# Patient Record
Sex: Male | Born: 1970 | Race: Black or African American | Hispanic: No | Marital: Married | Smoking: Light tobacco smoker
Health system: Southern US, Community
[De-identification: ages and names within clinical notes are randomized; demographics above are authoritative.]

## PROBLEM LIST (undated history)

## (undated) DIAGNOSIS — F4310 Post-traumatic stress disorder, unspecified: Secondary | ICD-10-CM

## (undated) DIAGNOSIS — E785 Hyperlipidemia, unspecified: Secondary | ICD-10-CM

## (undated) DIAGNOSIS — G935 Compression of brain: Secondary | ICD-10-CM

## (undated) DIAGNOSIS — G44009 Cluster headache syndrome, unspecified, not intractable: Secondary | ICD-10-CM

## (undated) DIAGNOSIS — G039 Meningitis, unspecified: Secondary | ICD-10-CM

## (undated) DIAGNOSIS — K219 Gastro-esophageal reflux disease without esophagitis: Secondary | ICD-10-CM

## (undated) DIAGNOSIS — I10 Essential (primary) hypertension: Secondary | ICD-10-CM

## (undated) DIAGNOSIS — F419 Anxiety disorder, unspecified: Secondary | ICD-10-CM

## (undated) DIAGNOSIS — I82409 Acute embolism and thrombosis of unspecified deep veins of unspecified lower extremity: Secondary | ICD-10-CM

## (undated) DIAGNOSIS — I2699 Other pulmonary embolism without acute cor pulmonale: Secondary | ICD-10-CM

## (undated) DIAGNOSIS — G4730 Sleep apnea, unspecified: Secondary | ICD-10-CM

## (undated) DIAGNOSIS — D369 Benign neoplasm, unspecified site: Secondary | ICD-10-CM

## (undated) DIAGNOSIS — G500 Trigeminal neuralgia: Secondary | ICD-10-CM

## (undated) DIAGNOSIS — G4733 Obstructive sleep apnea (adult) (pediatric): Secondary | ICD-10-CM

## (undated) DIAGNOSIS — G43909 Migraine, unspecified, not intractable, without status migrainosus: Secondary | ICD-10-CM

## (undated) DIAGNOSIS — M545 Low back pain, unspecified: Secondary | ICD-10-CM

## (undated) DIAGNOSIS — F17200 Nicotine dependence, unspecified, uncomplicated: Secondary | ICD-10-CM

## (undated) HISTORY — PX: ANTERIOR CRUCIATE LIGAMENT REPAIR: SHX115

## (undated) HISTORY — DX: Anxiety disorder, unspecified: F41.9

## (undated) HISTORY — DX: Other pulmonary embolism without acute cor pulmonale: I26.99

## (undated) HISTORY — DX: Compression of brain: G93.5

## (undated) HISTORY — DX: Migraine, unspecified, not intractable, without status migrainosus: G43.909

## (undated) HISTORY — DX: Trigeminal neuralgia: G50.0

## (undated) HISTORY — DX: Obstructive sleep apnea (adult) (pediatric): G47.33

## (undated) HISTORY — DX: Acute embolism and thrombosis of unspecified deep veins of unspecified lower extremity: I82.409

## (undated) HISTORY — DX: Low back pain, unspecified: M54.50

## (undated) HISTORY — PX: NOSE SURGERY: SHX723

## (undated) HISTORY — DX: Nicotine dependence, unspecified, uncomplicated: F17.200

## (undated) HISTORY — DX: Sleep apnea, unspecified: G47.30

## (undated) HISTORY — DX: Benign neoplasm, unspecified site: D36.9

## (undated) HISTORY — PX: FOOT SURGERY: SHX648

## (undated) HISTORY — PX: VASECTOMY: SHX75

## (undated) HISTORY — PX: SINUS SURGERY: SHX187

## (undated) HISTORY — DX: Post-traumatic stress disorder, unspecified: F43.10

## (undated) HISTORY — DX: Hyperlipidemia, unspecified: E78.5

## (undated) HISTORY — PX: OTHER SURGICAL HISTORY: SHX169

---

## 2001-04-02 ENCOUNTER — Encounter: Payer: Self-pay | Admitting: Emergency Medicine

## 2001-04-02 ENCOUNTER — Emergency Department (HOSPITAL_COMMUNITY): Admission: EM | Admit: 2001-04-02 | Discharge: 2001-04-02 | Payer: Self-pay | Admitting: Emergency Medicine

## 2004-01-27 ENCOUNTER — Emergency Department
Admission: EM | Admit: 2004-01-27 | Disposition: A | Discharge status: Home / Self Care | Payer: Self-pay | Source: Emergency Department | Admitting: Emergency Medicine

## 2004-06-07 ENCOUNTER — Emergency Department
Admission: EM | Admit: 2004-06-07 | Disposition: A | Discharge status: Home / Self Care | Payer: Self-pay | Source: Emergency Department | Admitting: Emergency Medicine

## 2004-11-08 ENCOUNTER — Emergency Department
Admission: EM | Admit: 2004-11-08 | Disposition: A | Discharge status: Home / Self Care | Payer: Self-pay | Source: Emergency Department | Admitting: Emergency Medicine

## 2005-01-13 ENCOUNTER — Emergency Department
Admission: EM | Admit: 2005-01-13 | Disposition: A | Discharge status: Home / Self Care | Payer: Self-pay | Source: Emergency Department | Admitting: Emergency Medicine

## 2005-05-12 ENCOUNTER — Emergency Department
Admission: EM | Admit: 2005-05-12 | Disposition: A | Discharge status: Home / Self Care | Payer: Self-pay | Source: Emergency Department | Admitting: Emergency Medicine

## 2005-05-13 LAB — ^CBC WITH DIFF MCKESSON
BASOPHILS %: 0.7 % (ref 0–2)
Baso(Absolute): 0
Eosinophils %: 3.5 % (ref 0–6)
Eosinophils Absolute: 0.2
Hematocrit: 45.3 % (ref 39.0–49.0)
Hemoglobin: 15.6 g/dL (ref 13.2–17.3)
Lymphocytes Absolute: 2.9
Lymphocytes Relative: 41.6 % (ref 25–55)
MCH: 30.5 pg (ref 27.0–34.0)
MCHC: 34.4 % (ref 32.0–36.0)
MCV: 88.6 fL (ref 80–100)
Monocytes Absolute: 0.6
Monocytes Relative %: 8.2 % — ABNORMAL HIGH (ref 1–8)
Neutrophils Absolute: 3.2
Neutrophils Relative %: 46 % — ABNORMAL LOW (ref 49–69)
Platelets: 175 10*3/uL (ref 150–400)
RBC: 5.11 /mm3 (ref 3.80–5.40)
RDW: 13.7 % (ref 11.0–14.0)
WBC: 7.1 10*3/uL (ref 4.8–10.8)

## 2005-05-13 LAB — BASIC METABOLIC PANEL
BUN / Creatinine Ratio: 11 (ref 8–20)
BUN: 14 mg/dL (ref 6–20)
CO2: 28 mmol/L (ref 21.0–31.0)
Calcium: 9.1 mg/dL (ref 8.4–10.2)
Chloride: 103 mmol/L (ref 101–111)
Creatinine: 1.26 mg/dL (ref 0.5–1.4)
EGFR: >60 mL/min/{1.73_m2}
EGFR: >60 mL/min/{1.73_m2}
Glucose: 99 mg/dL (ref 70–110)
Potassium: 4.3 mmol/L (ref 3.6–5.0)
Sodium: 140 mmol/L (ref 135–145)

## 2005-05-13 LAB — ^CKMB MCKESSON
CKMB Index: 0.29 % (ref 0.00–2.50)
CKMB Mass: 0.71 ng/mL (ref 0.00–3.38)

## 2005-05-13 LAB — CK: Creatine Kinase (CK): 247 U/L — ABNORMAL HIGH (ref 19–216)

## 2005-05-13 LAB — MAGNESIUM: Magnesium: 2.1 mg/dL (ref 1.7–2.2)

## 2005-05-13 LAB — ^TROPONIN I MCKESSON: Troponin: <0.04 ng/mL (ref 0.00–0.08)

## 2005-05-13 LAB — B-TYPE NATRIURETIC PEPTIDE: B-Natriuretic Peptide: <5 pg/mL (ref 0–100)

## 2005-05-13 LAB — D-DIMER, QUANTITATIVE: D-Dimer, Quant.: 692 ng/mL

## 2011-07-27 ENCOUNTER — Emergency Department: Payer: Enrolled Prime—HMO

## 2011-07-27 ENCOUNTER — Emergency Department
Admission: EM | Admit: 2011-07-27 | Discharge: 2011-07-27 | Disposition: A | Discharge status: Home / Self Care | Payer: Enrolled Prime—HMO | Attending: Emergency Medical Services | Admitting: Emergency Medical Services

## 2011-07-27 DIAGNOSIS — R51 Headache: Secondary | ICD-10-CM

## 2011-07-27 DIAGNOSIS — I10 Essential (primary) hypertension: Secondary | ICD-10-CM | POA: Insufficient documentation

## 2011-07-27 HISTORY — DX: Cluster headache syndrome, unspecified, not intractable: G44.009

## 2011-07-27 HISTORY — DX: Essential (primary) hypertension: I10

## 2011-07-27 MED ORDER — SODIUM CHLORIDE 0.9 % IV BOLUS
1000.00 mL | Freq: Once | INTRAVENOUS | Status: AC
Start: 2011-07-27 — End: 2011-07-27
  Administered 2011-07-27: 1000 mL via INTRAVENOUS

## 2011-07-27 MED ORDER — ONDANSETRON HCL 4 MG/2ML IJ SOLN
4.00 mg | Freq: Once | INTRAMUSCULAR | Status: AC
Start: 2011-07-27 — End: 2011-07-27
  Administered 2011-07-27: 4 mg via INTRAVENOUS
  Filled 2011-07-27: qty 2

## 2011-07-27 MED ORDER — KETOROLAC TROMETHAMINE 30 MG/ML IJ SOLN
30.00 mg | Freq: Once | INTRAMUSCULAR | Status: AC
Start: 2011-07-27 — End: 2011-07-27
  Administered 2011-07-27: 30 mg via INTRAVENOUS
  Filled 2011-07-27: qty 1

## 2011-07-27 MED ORDER — ONDANSETRON 4 MG PO TBDP
4.00 mg | ORAL_TABLET | Freq: Three times a day (TID) | ORAL | Status: AC | PRN
Start: 2011-07-27 — End: 2011-08-03

## 2011-07-27 MED ORDER — KETOROLAC TROMETHAMINE 10 MG PO TABS
10.00 mg | ORAL_TABLET | Freq: Four times a day (QID) | ORAL | Status: AC | PRN
Start: 2011-07-27 — End: 2011-08-01

## 2011-07-27 MED ORDER — HYDROCODONE-ACETAMINOPHEN 5-500 MG PO TABS
1.00 | ORAL_TABLET | Freq: Four times a day (QID) | ORAL | Status: AC | PRN
Start: 2011-07-27 — End: 2011-08-06

## 2011-07-27 MED ORDER — HYDROMORPHONE HCL PF 1 MG/ML IJ SOLN
1.00 mg | Freq: Once | INTRAMUSCULAR | Status: DC
Start: 2011-07-27 — End: 2011-07-27
  Filled 2011-07-27: qty 1

## 2011-07-27 NOTE — ED Provider Notes (Signed)
Physician/Midlevel provider first contact with patient: 61    History     Chief Complaint   Patient presents with   . Headache     Patient is a 41 y.o. male presenting with headaches. The history is provided by the patient. No language interpreter was used.   Headache  The primary symptoms include headaches. Primary symptoms do not include dizziness, fever, nausea or vomiting. The symptoms began yesterday. The symptoms are unchanged. The neurological symptoms are diffuse. Context: none.   The headache is not associated with photophobia or weakness.   Additional symptoms do not include weakness or photophobia. Medical issues also include hypertension.     41 y/o male, w/ hx of cluster HA, c/o HA starting behind eye and radiating to frontal area and neck starting yesterday morning. Pt also reports rhinorrhea yesterday and profuse tearing today. He spoke w/ neurologist last night and was unable to be seen until next week. He denies recent trauma, N/V, CP, SOB, URI sx, abd pain, rash, or urinary or bladder problems. Pt took 800mg  motrin w/ no relief. No h/o intracranial aneurysms in himself or family.  + minimal photophobia.    Past Medical History   Diagnosis Date   . Cluster headache    . Hypertensive disorder        Past Surgical History   Procedure Date   . Sinus surgery    . Foot surgery        No family history on file.    Social  History   Substance Use Topics   . Smoking status: Current Everyday Smoker -- 0.2 packs/day   . Smokeless tobacco: Not on file   . Alcohol Use: Yes      social       No Known Allergies    Current/Home Medications    LISINOPRIL (PRINIVIL,ZESTRIL) 10 MG TABLET    Take 10 mg by mouth daily.    OMEPRAZOLE (PRILOSEC) 40 MG CAPSULE    Take 40 mg by mouth daily.        Review of Systems   Constitutional: Negative for fever and chills.   HENT: Positive for rhinorrhea. Negative for sore throat and neck pain.    Eyes: Negative for photophobia and discharge.        Tearing   Respiratory: Negative  for cough and shortness of breath.    Cardiovascular: Negative for chest pain and palpitations.   Gastrointestinal: Negative for nausea, vomiting, abdominal pain and diarrhea.   Genitourinary: Negative for dysuria, frequency and hematuria.   Musculoskeletal: Negative for myalgias and back pain.   Neurological: Positive for headaches. Negative for dizziness, syncope, weakness, light-headedness and numbness.   Psychiatric/Behavioral: Negative for suicidal ideas and confusion.       Physical Exam    BP 121/80  Pulse 73  Temp(Src) 96.2 F (35.7 C) (Temporal Artery)  Resp 18  Ht 1.829 m  Wt 109.77 kg  BMI 32.81 kg/m2  SpO2 99%    Physical Exam   Nursing note and vitals reviewed.  Constitutional: He is oriented to person, place, and time. He appears well-developed and well-nourished.  Non-toxic appearance. No distress.   HENT:   Head: Atraumatic.   Mouth/Throat: Oropharynx is clear and moist. No oropharyngeal exudate.        Minimal tend over right parietal and frontal region     Eyes: Conjunctivae, EOM and lids are normal. Pupils are equal, round, and reactive to light. Right eye exhibits no discharge and no  exudate. Left eye exhibits no discharge and no exudate. Right conjunctiva is not injected. Left conjunctiva is not injected.        Increased clear drainage from right eye   Neck: Normal range of motion and full passive range of motion without pain. Neck supple. No JVD present. No tracheal tenderness, no spinous process tenderness and no muscular tenderness present. Carotid bruit is not present. Normal range of motion present.   Cardiovascular: Normal rate, regular rhythm, normal heart sounds, intact distal pulses and normal pulses.    Pulmonary/Chest: Effort normal and breath sounds normal. No stridor. No respiratory distress. He has no decreased breath sounds. He has no wheezes.   Abdominal: Soft. Bowel sounds are normal. He exhibits no distension and no mass. There is no tenderness. There is no rebound and  no guarding.   Musculoskeletal: Normal range of motion. He exhibits no edema and no tenderness.        No calf tenderness, no Homman's sign   Neurological: He is alert and oriented to person, place, and time. He has normal strength and normal reflexes. No cranial nerve deficit or sensory deficit. Coordination normal. GCS eye subscore is 4. GCS verbal subscore is 5. GCS motor subscore is 6.   Reflex Scores:       Patellar reflexes are 2+ on the right side and 2+ on the left side.  Skin: Skin is warm. No rash noted. He is not diaphoretic. No pallor.   Psychiatric: He has a normal mood and affect. His speech is normal and behavior is normal. Judgment and thought content normal. Cognition and memory are normal.       MDM and ED Course     ED Medication Orders     None           MDM  Number of Diagnoses or Management Options  Headache:   Diagnosis management comments: I, Reine Just, have assumed care of Angel [name].  I have completed his evaluation, reviewed all pertinent data, and determined his final disposition.    I attest that the documentation recorded by the scribe reflects the service I have performed and the decisions made by me. Physician documentation supersedes that of the scribe.   I Dr. Reine Just saw this pt.     Oxygen saturation by pulse oximetry is 95%-100%, Normal.  Interventions: None Needed.     I reviewed all labs and/or radiology studies.   Ddx:  Cluster headache, intracranial pathology, migraines.  Presentation and symptoms do not warrant LP if the ct of head is negative    There is a 30-45 minute delay in medication order times due to fault in EPIC system.  The orders in the ED are always stat orders.     pt. Feeling better        Procedures    Clinical Impression & Disposition     Clinical Impression  Final diagnoses:   Headache        ED Disposition     Discharge Angel [name] discharge to home/self care.    Condition at discharge: Good             New Prescriptions     HYDROCODONE-ACETAMINOPHEN (VICODIN) 5-500 MG PER TABLET    Take 1 tablet by mouth every 6 (six) hours as needed for Pain.    KETOROLAC (TORADOL) 10 MG TABLET    Take 1 tablet (10 mg total) by mouth every 6 (six) hours as needed for Pain.  ONDANSETRON (ZOFRAN ODT) 4 MG DISINTEGRATING TABLET    Take 1 tablet (4 mg total) by mouth every 8 (eight) hours as needed for Nausea.        Treatment Team: Scribe: Marlinda Mike, MD  07/27/11 [phone]

## 2011-07-27 NOTE — ED Notes (Signed)
Pt does not have a ride home and he declined Dilaudid and not able to get a ride.

## 2011-07-27 NOTE — ED Notes (Signed)
C/O headache onset yesterday AM, behind R eye, to R temporal area, down neck.  + photosensitivity.  States did have @ eye irritation 2 days ago.

## 2011-07-27 NOTE — Discharge Instructions (Signed)
f/u with pcp or Dr. Steva Ready in 2-3 days for re-eval. return to ed if worsen or not better or have any acute concerns.     Headache    You have been treated for a headache.    Headaches are very common. Most of the time they are benign (not harmful). Some headaches can be very serious. Your headache appears to be benign. The doctor feels it is safe for you to go home.    If you continue to have headaches, or if this headache does not resolve over the next few days, you should be evaluated by your regular doctor or a neurologist. Keep a "headache diary." This may help your doctor learn the cause of your headaches.    Take your headache medication as directed. This is especially important if your doctor has placed you on a daily medication to prevent headaches.    YOU SHOULD SEEK MEDICAL ATTENTION IMMEDIATELY, EITHER HERE OR AT THE NEAREST EMERGENCY DEPARTMENT, IF ANY OF THE FOLLOWING OCCURS:   Your headache gets worse.   You have a severe headache that occurs suddenly.   Your head pain is different from your normal headache.   You have a fever, especially with a stiff neck.   You feel numbness, tingling, or weakness in your arms or legs.   You pass out.   You have problems with your vision.   You vomit and have trouble taking medication or keeping it down.

## 2011-07-30 ENCOUNTER — Emergency Department: Payer: Enrolled Prime—HMO

## 2011-07-30 ENCOUNTER — Emergency Department
Admission: EM | Admit: 2011-07-30 | Discharge: 2011-07-30 | Disposition: A | Discharge status: Home / Self Care | Payer: Enrolled Prime—HMO | Attending: Emergency Medicine | Admitting: Emergency Medicine

## 2011-07-30 DIAGNOSIS — R51 Headache: Secondary | ICD-10-CM | POA: Insufficient documentation

## 2011-07-30 DIAGNOSIS — I10 Essential (primary) hypertension: Secondary | ICD-10-CM | POA: Insufficient documentation

## 2011-07-30 DIAGNOSIS — F17200 Nicotine dependence, unspecified, uncomplicated: Secondary | ICD-10-CM | POA: Insufficient documentation

## 2011-07-30 HISTORY — DX: Meningitis, unspecified: G03.9

## 2011-07-30 HISTORY — DX: Gastro-esophageal reflux disease without esophagitis: K21.9

## 2011-07-30 LAB — CBC AND DIFFERENTIAL
Basophils Absolute Automated: 0.02 10*3/uL (ref 0.00–0.20)
Basophils Automated: 0 % (ref 0–2)
Eosinophils Absolute Automated: 0.12 10*3/uL (ref 0.00–0.70)
Eosinophils Automated: 1 % (ref 0–5)
Hematocrit: 41.5 % — ABNORMAL LOW (ref 42.0–52.0)
Hgb: 14.8 g/dL (ref 13.0–17.0)
Immature Granulocytes Absolute: 0.04 10*3/uL
Immature Granulocytes: 0 % (ref 0–1)
Lymphocytes Absolute Automated: 4.26 10*3/uL (ref 0.50–4.40)
Lymphocytes Automated: 30 % (ref 15–41)
MCH: 29.4 pg (ref 28.0–32.0)
MCHC: 35.7 g/dL (ref 32.0–36.0)
MCV: 82.5 fL (ref 80.0–100.0)
MPV: 9.2 fL — ABNORMAL LOW (ref 9.4–12.3)
Monocytes Absolute Automated: 0.97 10*3/uL (ref 0.00–1.20)
Monocytes: 7 % (ref 0–11)
Neutrophils Absolute: 8.94 10*3/uL — ABNORMAL HIGH (ref 1.80–8.10)
Neutrophils: 63 % (ref 52–75)
Platelets: 174 10*3/uL (ref 140–400)
RBC: 5.03 10*6/uL (ref 4.70–6.00)
RDW: 14 % (ref 12–15)
WBC: 14.31 10*3/uL — ABNORMAL HIGH (ref 3.50–10.80)

## 2011-07-30 LAB — COMPREHENSIVE METABOLIC PANEL
ALT: 31 (ref 7–56)
AST (SGOT): 21 U/L (ref 5–40)
Albumin/Globulin Ratio: 1.3
Albumin: 4.1 g/dL (ref 3.9–5.0)
Alkaline Phosphatase: 74 U/L (ref 38–126)
BUN: 14 mg/dL (ref 6–20)
Bilirubin, Total: 0.5 mg/dL (ref 0.2–1.3)
CO2: 22 mmol/L (ref 21.1–31.1)
Calcium: 9.3 mg/dL (ref 8.4–10.2)
Chloride: 108 mmol/L (ref 101–111)
Creatinine: 1.16 mg/dL (ref 0.66–1.25)
Globulin: 3.1 g/dL
Glucose: 110 mg/dL — ABNORMAL HIGH (ref 70–100)
Potassium: 3.9 mmol/L (ref 3.6–5.0)
Protein, Total: 7.2 g/dL (ref 6.3–8.2)
Sodium: 143 mmol/L (ref 135–145)

## 2011-07-30 LAB — SEDIMENTATION RATE: Sed Rate: 10 mm/Hr (ref 0–15)

## 2011-07-30 LAB — GFR: EGFR: >60

## 2011-07-30 MED ORDER — SODIUM CHLORIDE 0.9 % IV BOLUS
1000.00 mL | Freq: Once | INTRAVENOUS | Status: AC
Start: 2011-07-30 — End: 2011-07-30
  Administered 2011-07-30: 1000 mL via INTRAVENOUS

## 2011-07-30 MED ORDER — PROMETHAZINE HCL 25 MG/ML IJ SOLN
12.50 mg | Freq: Once | INTRAMUSCULAR | Status: AC
Start: 2011-07-30 — End: 2011-07-30
  Administered 2011-07-30: 12.5 mg via INTRAVENOUS
  Filled 2011-07-30: qty 1

## 2011-07-30 MED ORDER — LORAZEPAM 2 MG/ML IJ SOLN
1.00 mg | Freq: Once | INTRAMUSCULAR | Status: AC
Start: 2011-07-30 — End: 2011-07-30
  Administered 2011-07-30: 1 mg via INTRAVENOUS
  Filled 2011-07-30: qty 1

## 2011-07-30 MED ORDER — HYDROMORPHONE HCL PF 1 MG/ML IJ SOLN
1.00 mg | Freq: Once | INTRAMUSCULAR | Status: AC
Start: 2011-07-30 — End: 2011-07-30
  Administered 2011-07-30: 1 mg via INTRAVENOUS
  Filled 2011-07-30: qty 1

## 2011-07-30 MED ORDER — DIPHENHYDRAMINE HCL 50 MG/ML IJ SOLN
25.00 mg | Freq: Once | INTRAMUSCULAR | Status: AC
Start: 2011-07-30 — End: 2011-07-30
  Administered 2011-07-30: 25 mg via INTRAVENOUS
  Filled 2011-07-30: qty 1

## 2011-07-30 MED ORDER — KETOROLAC TROMETHAMINE 30 MG/ML IJ SOLN
30.00 mg | Freq: Once | INTRAMUSCULAR | Status: AC
Start: 2011-07-30 — End: 2011-07-30
  Administered 2011-07-30: 30 mg via INTRAVENOUS
  Filled 2011-07-30: qty 1

## 2011-07-30 NOTE — ED Notes (Signed)
Report given to stacey rn

## 2011-07-30 NOTE — Discharge Instructions (Signed)
Headache    You have been treated for a headache.    Headaches are very common. Most of the time they are benign (not harmful). Some headaches can be very serious. Your headache appears to be benign. The doctor feels it is safe for you to go home.    If you continue to have headaches, or if this headache does not resolve over the next few days, you should be evaluated by your regular doctor or a neurologist. Keep a "headache diary." This may help your doctor learn the cause of your headaches.    Take your headache medication as directed. This is especially important if your doctor has placed you on a daily medication to prevent headaches.    YOU SHOULD SEEK MEDICAL ATTENTION IMMEDIATELY, EITHER HERE OR AT THE NEAREST EMERGENCY DEPARTMENT, IF ANY OF THE FOLLOWING OCCURS:   Your headache gets worse.   You have a severe headache that occurs suddenly.   Your head pain is different from your normal headache.   You have a fever, especially with a stiff neck.   You feel numbness, tingling, or weakness in your arms or legs.   You pass out.   You have problems with your vision.   You vomit and have trouble taking medication or keeping it down.

## 2011-07-30 NOTE — ED Provider Notes (Signed)
Physician/Midlevel provider first contact with patient: 8    History     Chief Complaint   Patient presents with   . Headache     HPI Comments: Patient with c/o HA starting 6 days ago after drinking some ETOH. States HA is over the right orbit. HA has been persistent and gradual in onset. Has not improved since onset. Was seen in ED 2 days after onset with negative CT head. Also seen by neurology ([facility]) 2 days ago and started on imitrex and prednisone with improvement. No motor or sensory deficits. No neck pain. No n/v. Mild photophobia. States he has had HA's in the past associated with drinking ETOH, but this HA is more persistent.     Patient is a 41 y.o. male presenting with headaches.   Headache  The primary symptoms include headaches and visual change. Primary symptoms do not include syncope, loss of consciousness, altered mental status, seizures, dizziness, paresthesias, focal weakness, loss of sensation, speech change, memory loss, fever, nausea or vomiting. The symptoms began 5 to 7 days ago. The symptoms are unchanged. The neurological symptoms are focal. The symptoms occurred after drinking alcohol.   The headache is associated with photophobia and visual change. The headache is not associated with aura, eye pain, neck stiffness, paresthesias, weakness or loss of balance.   The visual change includes photophobia.   Additional symptoms include photophobia. Additional symptoms do not include neck stiffness, weakness, loss of balance, aura, hallucinations, nystagmus, taste disturbance, hyperacusis, hearing loss, tinnitus or vertigo. Medical issues also include alcohol use and hypertension. Medical issues do not include seizures, cerebral vascular accident, cancer, drug use, diabetes or recent surgery. Workup history includes CT scan.       Past Medical History   Diagnosis Date   . Cluster headache    . Hypertensive disorder    . Acid reflux    . Meningitis        Past Surgical History   Procedure Date   .  Sinus surgery    . Foot surgery        History reviewed. No pertinent family history.    Social  History   Substance Use Topics   . Smoking status: Current Everyday Smoker -- 0.2 packs/day   . Smokeless tobacco: Not on file   . Alcohol Use: Yes      social       No Known Allergies    Current/Home Medications    HYDROCODONE-ACETAMINOPHEN (VICODIN) 5-500 MG PER TABLET    Take 1 tablet by mouth every 6 (six) hours as needed for Pain.    KETOROLAC (TORADOL) 10 MG TABLET    Take 1 tablet (10 mg total) by mouth every 6 (six) hours as needed for Pain.    LISINOPRIL (PRINIVIL,ZESTRIL) 10 MG TABLET    Take 10 mg by mouth daily.    OMEPRAZOLE (PRILOSEC) 40 MG CAPSULE    Take 40 mg by mouth daily.    ONDANSETRON (ZOFRAN ODT) 4 MG DISINTEGRATING TABLET    Take 1 tablet (4 mg total) by mouth every 8 (eight) hours as needed for Nausea.        Review of Systems   Constitutional: Negative for fever and chills.   HENT: Negative for hearing loss, neck stiffness and tinnitus.    Eyes: Positive for photophobia. Negative for pain.   Respiratory: Negative for cough and shortness of breath.    Cardiovascular: Negative for chest pain, palpitations and syncope.   Gastrointestinal: Negative for  nausea and vomiting.   Musculoskeletal: Negative for myalgias and back pain.   Skin: Negative for color change and rash.   Neurological: Positive for headaches. Negative for dizziness, vertigo, speech change, focal weakness, seizures, loss of consciousness, weakness, paresthesias and loss of balance.   Psychiatric/Behavioral: Negative for hallucinations, memory loss and altered mental status.       Physical Exam    BP 135/81  Pulse 94  Temp(Src) 97.2 F (36.2 C) (Oral)  Resp 16  Ht 1.829 m  Wt 110.678 kg  BMI 33.09 kg/m2  SpO2 98%    Physical Exam   Constitutional: He is oriented to person, place, and time. He appears well-developed and well-nourished. He appears distressed.   HENT:   Head: Normocephalic and atraumatic.        Not focally  tender over the right temporal artery   Eyes: Conjunctivae and EOM are normal. Pupils are equal, round, and reactive to light. Right eye exhibits no discharge. Left eye exhibits no discharge. No scleral icterus.   Neck: Normal range of motion. Neck supple. No JVD present. No tracheal deviation present.   Cardiovascular: Normal rate, regular rhythm and normal heart sounds.  Exam reveals no gallop and no friction rub.    No murmur heard.  Pulmonary/Chest: Effort normal and breath sounds normal. No stridor. No respiratory distress. He has no wheezes. He has no rales.   Abdominal: Soft. Bowel sounds are normal. He exhibits no distension and no mass. There is no tenderness. There is no rebound, no guarding and no CVA tenderness.   Musculoskeletal: Normal range of motion. He exhibits no edema and no tenderness.   Lymphadenopathy:     He has no cervical adenopathy.   Neurological: He is alert and oriented to person, place, and time. He has normal strength. No sensory deficit. GCS eye subscore is 4. GCS verbal subscore is 5. GCS motor subscore is 6.   Skin: Skin is warm, dry and intact.   Psychiatric: He has a normal mood and affect. His speech is normal and behavior is normal. Judgment and thought content normal.       MDM and ED Course     ED Medication Orders      Start     Status Ordering Provider    07/30/11 0645   sodium chloride 0.9 % bolus 1,000 mL   Once      Route: Intravenous  Ordered Dose: 1,000 mL         Ordered Mathis Cashman    07/30/11 0645   LORazepam (ATIVAN) injection 1 mg   Once      Route: Intravenous  Ordered Dose: 1 mg         Ordered Trynity Skousen    07/30/11 0645   promethazine (PHENERGAN) injection 12.5 mg   Once      Route: Intravenous  Ordered Dose: 12.5 mg         Ordered Masiyah Jorstad    07/30/11 0645   ketorolac (TORADOL) injection 30 mg   Once      Route: Intravenous  Ordered Dose: 30 mg         Ordered Danese Dorsainvil    07/30/11 0645   diphenhydrAMINE (BENADRYL) injection 25  mg   Once      Route: Intravenous  Ordered Dose: 25 mg         Ordered Nazareth Kirk  MDM  Number of Diagnoses or Management Options  Headache:   Diagnosis management comments: I, Earline Mayotte, MD, have been the primary provider for Royetta Asal during this Emergency Dept visit.    Oxygen saturation by pulse oximetry is 95%-100%, Normal.  Interventions: None Needed.    Doubt SAH or meningitis.        Amount and/or Complexity of Data Reviewed  Clinical lab tests: ordered and reviewed  Decide to obtain previous medical records or to obtain history from someone other than the patient: (Reviewed prior CT transcript result done this week.)          Procedures    Clinical Impression & Disposition     Clinical Impression  Final diagnoses:   None        ED Disposition     None           New Prescriptions    No medications on file               Earline Mayotte, MD  07/30/11 1342

## 2011-07-30 NOTE — ED Notes (Signed)
Pt states HA x 6 days was seen here on the 24th for same thing, went to the Neurologist and was dx with migraines, still having same pain

## 2012-01-27 ENCOUNTER — Observation Stay: Payer: Enrolled Prime—HMO

## 2012-01-27 ENCOUNTER — Observation Stay: Payer: Enrolled Prime—HMO | Admitting: Internal Medicine

## 2012-01-27 ENCOUNTER — Emergency Department: Payer: Enrolled Prime—HMO

## 2012-01-27 ENCOUNTER — Observation Stay
Admission: EM | Admit: 2012-01-27 | Discharge: 2012-01-28 | Disposition: A | Discharge status: Home / Self Care | Payer: Enrolled Prime—HMO | Attending: Internal Medicine | Admitting: Internal Medicine

## 2012-01-27 DIAGNOSIS — N179 Acute kidney failure, unspecified: Secondary | ICD-10-CM | POA: Diagnosis present

## 2012-01-27 DIAGNOSIS — G459 Transient cerebral ischemic attack, unspecified: Secondary | ICD-10-CM | POA: Diagnosis present

## 2012-01-27 DIAGNOSIS — R748 Abnormal levels of other serum enzymes: Secondary | ICD-10-CM | POA: Diagnosis present

## 2012-01-27 DIAGNOSIS — R42 Dizziness and giddiness: Secondary | ICD-10-CM | POA: Insufficient documentation

## 2012-01-27 DIAGNOSIS — Z823 Family history of stroke: Secondary | ICD-10-CM | POA: Insufficient documentation

## 2012-01-27 DIAGNOSIS — F17200 Nicotine dependence, unspecified, uncomplicated: Secondary | ICD-10-CM | POA: Insufficient documentation

## 2012-01-27 DIAGNOSIS — R7989 Other specified abnormal findings of blood chemistry: Secondary | ICD-10-CM | POA: Insufficient documentation

## 2012-01-27 DIAGNOSIS — R29898 Other symptoms and signs involving the musculoskeletal system: Principal | ICD-10-CM | POA: Insufficient documentation

## 2012-01-27 DIAGNOSIS — R259 Unspecified abnormal involuntary movements: Secondary | ICD-10-CM | POA: Insufficient documentation

## 2012-01-27 DIAGNOSIS — I10 Essential (primary) hypertension: Secondary | ICD-10-CM | POA: Insufficient documentation

## 2012-01-27 DIAGNOSIS — G935 Compression of brain: Secondary | ICD-10-CM | POA: Insufficient documentation

## 2012-01-27 DIAGNOSIS — K219 Gastro-esophageal reflux disease without esophagitis: Secondary | ICD-10-CM | POA: Insufficient documentation

## 2012-01-27 LAB — COMPREHENSIVE METABOLIC PANEL
ALT: 25 U/L (ref 0–55)
AST (SGOT): 24 U/L (ref 5–34)
Albumin/Globulin Ratio: 1.3 (ref 0.9–2.2)
Albumin: 3.9 g/dL (ref 3.5–5.0)
Alkaline Phosphatase: 87 U/L (ref 40–150)
Anion Gap: 6 (ref 5.0–15.0)
BUN: 14 mg/dL (ref 9.0–21.0)
Bilirubin, Total: 0.5 mg/dL (ref 0.2–1.2)
CO2: 27 mEq/L (ref 22–29)
Calcium: 10 mg/dL (ref 8.5–10.5)
Chloride: 106 mEq/L (ref 98–107)
Creatinine: 1.4 mg/dL — ABNORMAL HIGH (ref 0.7–1.3)
Globulin: 3.1 g/dL (ref 2.0–3.6)
Glucose: 82 mg/dL (ref 70–100)
Potassium: 4.9 mEq/L (ref 3.5–5.1)
Protein, Total: 7 g/dL (ref 6.0–8.3)
Sodium: 139 mEq/L (ref 136–145)

## 2012-01-27 LAB — CBC AND DIFFERENTIAL
Basophils Absolute Automated: 0.03 10*3/uL (ref 0.00–0.20)
Basophils Automated: 0 % (ref 0–2)
Eosinophils Absolute Automated: 0.21 10*3/uL (ref 0.00–0.70)
Eosinophils Automated: 3 % (ref 0–5)
Hematocrit: 41.6 % — ABNORMAL LOW (ref 42.0–52.0)
Hgb: 14.3 g/dL (ref 13.0–17.0)
Immature Granulocytes Absolute: 0.02 10*3/uL
Immature Granulocytes: 0 % (ref 0–1)
Lymphocytes Absolute Automated: 2.44 10*3/uL (ref 0.50–4.40)
Lymphocytes Automated: 33 % (ref 15–41)
MCH: 29.2 pg (ref 28.0–32.0)
MCHC: 34.4 g/dL (ref 32.0–36.0)
MCV: 85.1 fL (ref 80.0–100.0)
MPV: 9 fL — ABNORMAL LOW (ref 9.4–12.3)
Monocytes Absolute Automated: 0.64 10*3/uL (ref 0.00–1.20)
Monocytes: 9 % (ref 0–11)
Neutrophils Absolute: 4.15 10*3/uL (ref 1.80–8.10)
Neutrophils: 56 % (ref 52–75)
Platelets: 165 10*3/uL (ref 140–400)
RBC: 4.89 10*6/uL (ref 4.70–6.00)
RDW: 14 % (ref 12–15)
WBC: 7.47 10*3/uL (ref 3.50–10.80)

## 2012-01-27 LAB — PT/INR
PT INR: 1.1
PT: 13.8 s (ref 12.6–15.0)

## 2012-01-27 LAB — APTT: PTT: 26 s (ref 23–37)

## 2012-01-27 LAB — POCT GLUCOSE: Whole Blood Glucose POCT: 111 mg/dL — AB (ref 70–100)

## 2012-01-27 LAB — TROPONIN I: Troponin I: 0.02 ng/mL (ref 0.00–0.09)

## 2012-01-27 LAB — GFR: EGFR: >60

## 2012-01-27 LAB — CKMB: Creatinine Kinase MB (CKMB): 2.4 ng/mL (ref 0.0–4.9)

## 2012-01-27 LAB — CK: Creatine Kinase (CK): 525 U/L — ABNORMAL HIGH (ref 30–200)

## 2012-01-27 MED ORDER — ASPIRIN 325 MG PO TBEC
325.00 mg | DELAYED_RELEASE_TABLET | Freq: Every day | ORAL | Status: DC
Start: 2012-01-28 — End: 2012-01-28
  Administered 2012-01-28: 325 mg via ORAL
  Filled 2012-01-27: qty 1

## 2012-01-27 MED ORDER — ENOXAPARIN SODIUM 40 MG/0.4ML SC SOLN
40.00 mg | Freq: Every day | SUBCUTANEOUS | Status: DC
Start: 2012-01-28 — End: 2012-01-28
  Filled 2012-01-27: qty 0.4

## 2012-01-27 MED ORDER — ASPIRIN 81 MG PO CHEW
324.00 mg | CHEWABLE_TABLET | Freq: Once | ORAL | Status: AC
Start: 2012-01-27 — End: 2012-01-27
  Administered 2012-01-27: 324 mg via ORAL
  Filled 2012-01-27: qty 4

## 2012-01-27 MED ORDER — LORAZEPAM 2 MG/ML IJ SOLN
INTRAMUSCULAR | Status: AC
Start: 2012-01-27 — End: 2012-01-27
  Administered 2012-01-27: 1 mg via INTRAVENOUS
  Filled 2012-01-27: qty 1

## 2012-01-27 MED ORDER — SODIUM CHLORIDE 0.9 % IV SOLN
INTRAVENOUS | Status: DC
Start: 2012-01-27 — End: 2012-01-28

## 2012-01-27 MED ORDER — ROSUVASTATIN CALCIUM 5 MG PO TABS
5.00 mg | ORAL_TABLET | Freq: Every evening | ORAL | Status: DC
Start: 2012-01-27 — End: 2012-01-28
  Administered 2012-01-27: 5 mg via ORAL
  Filled 2012-01-27: qty 1

## 2012-01-27 MED ORDER — ONDANSETRON HCL 4 MG/2ML IJ SOLN
4.00 mg | Freq: Three times a day (TID) | INTRAMUSCULAR | Status: DC | PRN
Start: 2012-01-27 — End: 2012-01-28

## 2012-01-27 MED ORDER — PANTOPRAZOLE SODIUM 40 MG PO TBEC
40.00 mg | DELAYED_RELEASE_TABLET | Freq: Two times a day (BID) | ORAL | Status: DC
Start: 2012-01-27 — End: 2012-01-28
  Administered 2012-01-27 – 2012-01-28 (×2): 40 mg via ORAL
  Filled 2012-01-27 (×2): qty 1

## 2012-01-27 MED ORDER — LORAZEPAM 2 MG/ML IJ SOLN
1.00 mg | Freq: Once | INTRAMUSCULAR | Status: AC
Start: 2012-01-27 — End: 2012-01-27

## 2012-01-27 NOTE — ED Provider Notes (Signed)
Physician/Midlevel provider first contact with patient: 01/27/12 1532         History     Chief Complaint   Patient presents with   . Extremity Weakness   . Dizziness     Patient is a 41 y.o. male presenting with extremity weakness. The history is provided by the patient.   Extremity Weakness  This is a new problem. Episode onset: 2:45p improved in 45 min. The problem occurs rarely. The problem has been gradually improving. Associated symptoms include weakness. Pertinent negatives include no abdominal pain, chest pain, chills, coughing, fever, headaches, myalgias, nausea, neck pain, rash, sore throat, urinary symptoms, visual change or vomiting. Associated symptoms comments: Sudden onset of left arm weakness and lightheaded and blurred vision x 45 min , improved now.. Nothing aggravates the symptoms. He has tried nothing for the symptoms. The treatment provided no relief.       Past Medical History   Diagnosis Date   . Cluster headache    . Hypertensive disorder    . Acid reflux    . Meningitis    . Arnold-Chiari malformation, type I        Past Surgical History   Procedure Date   . Sinus surgery    . Foot surgery    . Acl repair    . Vasectomy        No family history on file.    Social  History   Substance Use Topics   . Smoking status: Current Every Day Smoker -- 0.2 packs/day   . Smokeless tobacco: Not on file   . Alcohol Use: Yes      Comment: social       .     No Known Allergies    Current/Home Medications    BUPROPION HCL PO    Take by mouth.    GABAPENTIN (NEURONTIN) 100 MG CAPSULE    Take 100 mg by mouth 3 (three) times daily.    IBUPROFEN (ADVIL,MOTRIN) 200 MG TABLET    Take 200 mg by mouth every 6 (six) hours as needed.    LISINOPRIL (PRINIVIL,ZESTRIL) 10 MG TABLET    Take 10 mg by mouth daily.    OMEPRAZOLE (PRILOSEC) 40 MG CAPSULE    Take 40 mg by mouth daily.        Review of Systems   Constitutional: Negative for fever and chills.   HENT: Negative for sore throat and neck pain.    Respiratory:  Negative for cough and shortness of breath.    Cardiovascular: Negative for chest pain and palpitations.   Gastrointestinal: Negative for nausea, vomiting and abdominal pain.   Musculoskeletal: Positive for extremity weakness. Negative for myalgias and back pain.   Skin: Negative for color change and rash.   Neurological: Positive for dizziness, weakness and light-headedness. Negative for syncope and headaches.   All other systems reviewed and are negative.        Physical Exam    BP 138/77  Pulse 88  Temp 98.4 F (36.9 C) (Temporal Artery)  Resp 16  Ht 1.829 m  Wt 115.667 kg  BMI 34.58 kg/m2  SpO2 99%    Physical Exam   Nursing note and vitals reviewed.  Constitutional: He is oriented to person, place, and time. No distress.   HENT:   Head: Normocephalic and atraumatic.   Mouth/Throat: Oropharynx is clear and moist.   Eyes: Conjunctivae normal and EOM are normal.   Neck: Normal range of motion. Neck supple. No  rigidity.   Cardiovascular: Normal rate, regular rhythm, normal heart sounds and intact distal pulses.    Pulmonary/Chest: Effort normal and breath sounds normal. No respiratory distress.   Abdominal: Soft. Bowel sounds are normal. He exhibits no distension. There is no tenderness. There is no CVA tenderness.   Musculoskeletal: Normal range of motion. He exhibits no edema.   Neurological: He is alert and oriented to person, place, and time. He has normal strength. No cranial nerve deficit or sensory deficit. Coordination normal.   Skin: Skin is warm and dry. No rash noted.       MDM and ED Course     ED Medication Orders      Start     Status Ordering Provider    01/27/12 1730   aspirin chewable tablet 324 mg   Once      Route: Oral  Ordered Dose: 324 mg         Last MAR action:  Given Davey Bergsma                 MDM  Number of Diagnoses or Management Options  TIA (transient ischemic attack):   Diagnosis management comments: DR. Helayne Seminole  is the primary attending for this patient and has  obtained and performed the history, PE, and medical decision making for this patient.      In addition, in the EPIC EMR timed orders such as fluids, iv boluses, and any medication order including drips is not accurately reflected due to a glitch in their programming. Therefore timing of such things as above is not accurate and is often times delayed by 30-45 min automatically. However, all orders and meds in the ED are carried out in a stat manner.     Differential Diagnosis : CVA, TIA, ICH.      Oxygen saturation by pulse oximetry is 95%-100%, Normal.  Interventions: None Needed.    Attending Dr. Helayne Seminole    EKG Interpretation  EKG interpreted by ED physician  Rate: Normal for age.  Rhythm: Normal sinus rhythm  Axis: Normal for age  PR, QRS and QT intervals:  normal for age and rate  ST Segments: No deviations suggestive of ischemia  Impression: Normal ECG with no evidence of ischemia.    Attending : Dr. Helayne Seminole           Amount and/or Complexity of Data Reviewed  Clinical lab tests: ordered and reviewed  Tests in the radiology section of CPT: ordered and reviewed  Independent visualization of images, tracings, or specimens: yes    aspirin given. Not a tpa candidate. Pt improving nih ss = 0.  I spoke to hospitalist regarding admission.     Results     Procedure Component Value Units Date/Time    CK-MB [[id number]] Collected:01/27/12 1547     Creatinine Kinase MB (CKMB) 2.4 ng/mL Updated:01/27/12 1650    Comprehensive metabolic panel [[id number]]  (Abnormal) Collected:01/27/12 1547    Specimen Information:Blood Updated:01/27/12 1628     Glucose 82 mg/dL      BUN 64.3 mg/dL      Creatinine 1.4 (H) mg/dL      Sodium 329 mEq/L      Potassium 4.9 mEq/L      Chloride 106 mEq/L      CO2 27 mEq/L      Calcium 10.0 mg/dL      Protein, Total 7.0 g/dL      Albumin 3.9 g/dL      AST (SGOT)  24 U/L      ALT 25 U/L      Alkaline Phosphatase 87 U/L      Bilirubin, Total 0.5 mg/dL      Globulin 3.1 g/dL       Albumin/Globulin Ratio 1.3      Anion Gap 6.0     Creatine Kinase (CK) [[id number]]  (Abnormal) Collected:01/27/12 1547    Specimen Information:Blood Updated:01/27/12 1628     Creatine Kinase (CK) 525 (H) U/L     GFR [[id number]] Collected:01/27/12 1547     EGFR >60.0 Updated:01/27/12 1628    Troponin I [[id number]] Collected:01/27/12 1547    Specimen Information:Blood Updated:01/27/12 1628     Troponin I 0.02 ng/mL     APTT [[id number]] Collected:01/27/12 1547     PTT 26 sec Updated:01/27/12 1617    Protime-INR [[id number]] Collected:01/27/12 1547    Specimen Information:Blood Updated:01/27/12 1617     PT 13.8 sec      PT INR 1.1      PT Anticoag. Given Within 48 hrs. None     Accucheck [[id number]]  (Abnormal) Collected:01/27/12 1605     POCT Glucose WB 111 (A) mg/dL ZDGUYQI:34/74/25 9563    CBC and differential [[id number]]  (Abnormal) Collected:01/27/12 1547    Specimen Information:Blood Updated:01/27/12 1605     WBC 7.47 x10 3/uL      RBC 4.89 x10 6/uL      Hgb 14.3 g/dL      Hematocrit 51.8 (L) %      MCV 85.1 fL      MCH 29.2 pg      MCHC 34.4 g/dL      RDW 14 %      Platelets 165 x10 3/uL      MPV 9.0 (L) fL      Neutrophils 56 %      Lymphocytes Automated 33 %      Monocytes 9 %      Eosinophils Automated 3 %      Basophils Automated 0 %      Immature Granulocyte 0 %      Neutrophils Absolute 4.15 x10 3/uL      Abs Lymph Automated 2.44 x10 3/uL      Abs Mono Automated 0.64 x10 3/uL      Abs Eos Automated 0.21 x10 3/uL      Absolute Baso Automated 0.03 x10 3/uL      Absolute Immature Granulocyte 0.02 x10 3/uL           Radiology Results (24 Hour)     Procedure Component Value Units Date/Time    CT Head WO Contrast [[id number]] Collected:01/27/12 1527    Order Status:Completed  Updated:01/27/12 1545    Narrative:    HISTORY: Weakness, dizziness.     FINDINGS: Brain CT without intravenous contrast. Correlation with the  brain CT dated July 27, 2011.     The cerebellar tonsils extend below the level of the foramen  magnum and  below the imaged area and there is crowding of structures in the foramen  magnum. 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  The brain parenchyma is otherwise unremarkable appearing. There is no  focal mass effect, acute intracranial hemorrhage. The ventricular system  and cisterns are normally configured. There is minor mucosal thickening  in the left maxillary sinus and ethmoid air cells. The calvarium is  intact.       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  This if needed could be better evaluated with an MRI.                        Procedures    Clinical Impression & Disposition     Clinical Impression  Final diagnoses:   TIA (transient ischemic attack)        ED Disposition     Admit Bed Type: Telemetry [5]  Admitting Physician: Judson Roch [[id number]]  Patient Class: Observation [104]             New Prescriptions    No medications on file               Helayne Seminole, MD  01/27/12 1735

## 2012-01-27 NOTE — ED Notes (Signed)
At 1445 this afternoon pt with sudden onset of left sided arm weakness, unable to read fine print on phone with bilateral eyes and also had dizzness, no visible neurological defiits at this tiime. Pt states father died of stroke at age 42

## 2012-01-27 NOTE — H&P (Signed)
[facility] Hospitalist H&P.      Date Time: 01/27/2012  10:12 PM  Patient Name:Angel [name]  UYQ:[id number]  PCP: Patsy Lager, MD  Attending Physician:Taria Castrillo Louie Bun M.D.,    Assessment/Plan   1. Transient Ischemic Attack - Will get MRI.  Patient unable to tolerate MRA so ordered a duplex of his carotid arteries as a substitute.  Received an aspirin in the ER and will get a daily aspirin.  Consulted Dr. Wenda Low and he will come to evaluate the patient tomorrow morning.  Lipid panel pending.    2. Chiari Malformation Type I - MRI ordered.  Dr. Marney Doctor to help with management and appreciate his assistance.    3. Hypertension - On lisinopril daily.  Will hold for now and re-start on discharge.    4. Elevated CPK - not pathologic as patient is muscular.  Will hydrate.    5. Acute Kidney Injury - Could be secondary to increased muscle mass.  Will hydrate and check BMP in AM.    DVT Prohylaxis: Enoxaparin  Code Status: FULL   Disposition: Likely home tomorrow.  Prognosis: Good.    Chief Complaint:     Chief Complaint   Patient presents with   . Extremity Weakness   . Dizziness       History of Present Illness:   Angel [name] is a 41 y.o. male who presents with weakness.  States he was at the bar drinking beer when he felt sudden weakness.  States he was lifting his beer to his mouth when he felt a sudden weakness in his left arm.  He was unable to lift his beer further because of the loss of strength.  This persisted for about 15 minutes.  After he sat down, he felt a significant dizziness and then came to the ER.  No loss of sensation or headache.      Past Medical History:     Past Medical History   Diagnosis Date   . Cluster headache    . Hypertensive disorder    . Acid reflux    . Meningitis    . Arnold-Chiari malformation, type I        Past Surgical History:     Past Surgical History   Procedure Date   . Sinus surgery    . Foot surgery    . Acl repair    . Vasectomy        Family History:     Father had a stroke in his  25s.    Social History:     History   Alcohol Use   . Yes     Comment: social     History   Drug Use No     History   Smoking status   . Current Every Day Smoker -- 0.2 packs/day   Smokeless tobacco   . Not on file     History     Social History   . Marital Status: Married     Spouse Name: N/A     Number of Children: N/A   . Years of Education: N/A     Social History Main Topics   . Smoking status: Current Every Day Smoker -- 0.2 packs/day   . Smokeless tobacco: None   . Alcohol Use: Yes      Comment: social   . Drug Use: No   . Sexually Active: None     Other Topics Concern   . None  Social History Narrative   . None   Retired from [employer].  Social alcohol drinker.  2-3 cigarettes a day, but used to be a heavy smoker.  No illicit drug use.    Allergies:   No Known Allergies    Medications:     Gabapentin PRN   Lisinopril  Omeprazole    Review of Systems:     Constitutional: Negative for chills, weight loss, malaise/fatigue and diaphoresis.   HENT: Negative for hearing loss, ear pain, nosebleeds, congestion, sore throat, neck pain, tinnitus and ear discharge.    Eyes: Positive for blurred vision, - double vision, photophobia, pain, discharge and redness.   Respiratory: Negative for cough, hemoptysis, sputum production, shortness of breath, wheezing and stridor.    Cardiovascular: Negative for chest pain, palpitations, orthopnea, claudication, leg swelling and PND.   Gastrointestinal: Negative for heartburn, nausea, vomiting, abdominal pain, diarrhea, constipation, blood in stool and melena.   Genitourinary: Negative for dysuria, urgency, frequency, hematuria and flank pain.   Musculoskeletal: Negative for myalgias, back pain, joint pain and falls.   Skin: Negative for itching and rash.   Neurological: Positive for dizziness, - tingling, tremors, sensory change, speech change, + focal weakness, - seizures, loss of consciousness, weakness and headaches.   Endo/Heme/Allergies: Negative for environmental  allergies and polydipsia. Does not bruise/bleed easily.   Psychiatric/Behavioral: Negative for depression, suicidal ideas, hallucinations, memory loss and substance abuse. The patient is not nervous/anxious and does not have insomnia.           Physical Exam:    height is 1.829 m (6' 0.01") and weight is 115.667 kg (255 lb). His temporal artery temperature is 98.8 F (37.1 C). His blood pressure is 134/79 and his pulse is 71. His respiration is 18 and oxygen saturation is 98%.   Body mass index is 34.58 kg/(m^2).  Filed Vitals:    01/27/12 1704 01/27/12 1744 01/27/12 1855 01/27/12 2207   BP: 141/83 147/70 146/77 134/79   Pulse: 80 76 70 71   Temp:    98.8 F (37.1 C)   TempSrc:    Temporal Artery   Resp: 17 18 18 18    Height:       Weight:       SpO2: 99% 100% 98% 98%       Constitutional: Patient is oriented to person, place, and time. Patient appears well-developed and well-nourished.   Head: Normocephalic and atraumatic.  Eyes- pupils equal and reactive, extraocular eye movements intact, sclera anicteric  Mouth - mucous membranes moist, pharynx normal without lesions  Neck: Normal range of motion. Neck supple. No tracheal deviation present. No thyromegaly present.   Cardiovascular: Normal rate, regular rhythm, normal heart sounds and intact distal pulses.  Exam reveals no gallop and no friction rub. No murmur heard.  Pulmonary/Chest: Effort normal and breath sounds normal. No stridor. No respiratory distress. Patient has no wheezes. No rales were present.  Exhibits no tenderness.   Abdominal: Soft. Bowel sounds are normal. Patient exhibits no distension and no mass was palpable. There is no tenderness. There is no rebound and no guarding.   Musculoskeletal: Normal range of motion. Patient exhibits no edema and no tenderness.   Lymphadenopathy:  Patient has no cervical adenopathy.   Neurological: Patient is alert and oriented to person, place, and time and has normal reflexes. No cranial nerve deficit.  Normal  muscle tone. Coordination normal.   Skin: Skin is warm. No rash noted. Patient is not diaphoretic. No erythema. No  pallor.   Psychiatric: Has normal mood and affect. Behavior is normal. Judgment and thought content normal.          Labs:     Results for orders placed during the hospital encounter of 01/27/12   POCT GLUCOSE       Component Value Range    POCT Glucose WB 111 (*) 70 - 100 mg/dL   CBC AND DIFFERENTIAL       Component Value Range    WBC 7.47  3.50 - 10.80 x10 3/uL    RBC 4.89  4.70 - 6.00 x10 6/uL    Hgb 14.3  13.0 - 17.0 g/dL    Hematocrit 41.3 (*) 42.0 - 52.0 %    MCV 85.1  80.0 - 100.0 fL    MCH 29.2  28.0 - 32.0 pg    MCHC 34.4  32.0 - 36.0 g/dL    RDW 14  12 - 15 %    Platelets 165  140 - 400 x10 3/uL    MPV 9.0 (*) 9.4 - 12.3 fL    Neutrophils 56  52 - 75 %    Lymphocytes Automated 33  15 - 41 %    Monocytes 9  0 - 11 %    Eosinophils Automated 3  0 - 5 %    Basophils Automated 0  0 - 2 %    Immature Granulocyte 0  0 - 1 %    Neutrophils Absolute 4.15  1.80 - 8.10 x10 3/uL    Abs Lymph Automated 2.44  0.50 - 4.40 x10 3/uL    Abs Mono Automated 0.64  0.00 - 1.20 x10 3/uL    Abs Eos Automated 0.21  0.00 - 0.70 x10 3/uL    Absolute Baso Automated 0.03  0.00 - 0.20 x10 3/uL    Absolute Immature Granulocyte 0.02  0 x10 3/uL   COMPREHENSIVE METABOLIC PANEL       Component Value Range    Glucose 82  70 - 100 mg/dL    BUN 24.4  9.0 - 01.0 mg/dL    Creatinine 1.4 (*) 0.7 - 1.3 mg/dL    Sodium 272  536 - 644 mEq/L    Potassium 4.9  3.5 - 5.1 mEq/L    Chloride 106  98 - 107 mEq/L    CO2 27  22 - 29 mEq/L    Calcium 10.0  8.5 - 10.5 mg/dL    Protein, Total 7.0  6.0 - 8.3 g/dL    Albumin 3.9  3.5 - 5.0 g/dL    AST (SGOT) 24  5 - 34 U/L    ALT 25  0 - 55 U/L    Alkaline Phosphatase 87  40 - 150 U/L    Bilirubin, Total 0.5  0.2 - 1.2 mg/dL    Globulin 3.1  2.0 - 3.6 g/dL    Albumin/Globulin Ratio 1.3  0.9 - 2.2    Anion Gap 6.0  5.0 - 15.0   PT/INR       Component Value Range    PT 13.8  12.6 - 15.0 sec    PT INR  1.1  None Estab.    PT Anticoag. Given Within 48 hrs. None     APTT       Component Value Range    PTT 26  23 - 37 sec   ECG 12-LEAD       Component Value Range    Ventricular Rate 91  Atrial Rate 91      P-R Interval 154      QRS Duration 84      Q-T Interval 354      QTC Calculation (Bezet) 435      P Axis 33      R Axis 13      T Axis 29     TROPONIN I       Component Value Range    Troponin I 0.02  0.00 - 0.09 ng/mL   CK       Component Value Range    Creatine Kinase (CK) 525 (*) 30 - 200 U/L   GFR       Component Value Range    EGFR >60.0     CKMB       Component Value Range    Creatinine Kinase MB (CKMB) 2.4  0.0 - 4.9 ng/mL     Reviewed.    Rads:     Radiology Results (24 Hour)     Procedure Component Value Units Date/Time    MRI Brain WO Contrast [[id number]]     Order Status:Sent  Updated:01/27/12 2208    CT Head WO Contrast [[id number]] Collected:01/27/12 1527    Order Status:Completed  Updated:01/27/12 1545    Narrative:    HISTORY: Weakness, dizziness.     FINDINGS: Brain CT without intravenous contrast. Correlation with the  brain CT dated July 27, 2011.     The cerebellar tonsils extend below the level of the foramen magnum and  below the imaged area and there is crowding of structures in the foramen  magnum. 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  The brain parenchyma is otherwise unremarkable appearing. There is no  focal mass effect, acute intracranial hemorrhage. The ventricular system  and cisterns are normally configured. There is minor mucosal thickening  in the left maxillary sinus and ethmoid air cells. The calvarium is  intact.       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  This if needed could be better evaluated with an MRI.          Total Time spent for Admission:   74 minutes, with >50% involved in direct patient care.    Signed by: Cora Collum, MD 01/27/2012 10:12 PM

## 2012-01-28 ENCOUNTER — Observation Stay: Payer: Enrolled Prime—HMO

## 2012-01-28 LAB — LIPID PANEL
Cholesterol / HDL Ratio: 3.4 Index
Cholesterol: 178 mg/dL (ref 0–199)
HDL: 52 mg/dL (ref >40)
LDL Calculated: 103 mg/dL — ABNORMAL HIGH (ref 0–99)
Triglycerides: 115 mg/dL (ref 34–149)
VLDL Calculated: 23 mg/dL (ref 10–40)

## 2012-01-28 LAB — COMPREHENSIVE METABOLIC PANEL
ALT: 23 U/L (ref 0–55)
AST (SGOT): 20 U/L (ref 5–34)
Albumin/Globulin Ratio: 1.1 (ref 0.9–2.2)
Albumin: 3.5 g/dL (ref 3.5–5.0)
Alkaline Phosphatase: 89 U/L (ref 40–150)
Anion Gap: 10 (ref 5.0–15.0)
BUN: 17.3 mg/dL (ref 9.0–21.0)
Bilirubin, Total: 0.4 mg/dL (ref 0.2–1.2)
CO2: 23 mEq/L (ref 22–29)
Calcium: 9.6 mg/dL (ref 8.5–10.5)
Chloride: 106 mEq/L (ref 98–107)
Creatinine: 1.2 mg/dL (ref 0.7–1.3)
Globulin: 3.2 g/dL (ref 2.0–3.6)
Glucose: 110 mg/dL — ABNORMAL HIGH (ref 70–100)
Potassium: 4.7 mEq/L (ref 3.5–5.1)
Protein, Total: 6.7 g/dL (ref 6.0–8.3)
Sodium: 139 mEq/L (ref 136–145)

## 2012-01-28 LAB — HEMOLYSIS INDEX: Hemolysis Index: 3 Index (ref 0–9)

## 2012-01-28 LAB — ECG 12-LEAD
Atrial Rate: 91 {beats}/min
P Axis: 33 degrees
P-R Interval: 154 ms
Q-T Interval: 354 ms
QRS Duration: 84 ms
QTC Calculation (Bezet): 435 ms
R Axis: 13 degrees
T Axis: 29 degrees
Ventricular Rate: 91 {beats}/min

## 2012-01-28 LAB — GFR: EGFR: >60

## 2012-01-28 LAB — HEMOGLOBIN A1C: Hemoglobin A1C: 5.5 % (ref 0.0–6.0)

## 2012-01-28 MED ORDER — ASPIRIN 81 MG PO TBEC
81.00 mg | DELAYED_RELEASE_TABLET | Freq: Every day | ORAL | Status: AC
Start: 2012-01-28 — End: 2013-01-27

## 2012-01-28 MED ORDER — LISINOPRIL 10 MG PO TABS
10.00 mg | ORAL_TABLET | Freq: Once | ORAL | Status: AC
Start: 2012-01-28 — End: 2012-01-28
  Administered 2012-01-28: 10 mg via ORAL
  Filled 2012-01-28: qty 1

## 2012-01-28 NOTE — Final Progress Note (DC Note for stay less than 48 (Signed)
Discharge instructions given, med list given, what it is for and its side effect.

## 2012-01-28 NOTE — Final Progress Note (DC Note for stay less than 48 (Addendum)
Patient seen and evaluated this AM.      All symptoms have resolved.    U/S without abnormalities.      MRI only showed Chiari I malformation.    Dr. Marney Doctor saw the patient and agreed with a baby aspirin and Neurology follow-up.  ? TIA vs. Anxiety reaction.    LDL at 103.  Goal would be 100, but an initial value of 103 is very close to goal.  Advised patient to continue exercise and eating right and this should be checked in 3 months.  If still elevated, he would need a statin.    Handout given for aspirin.    Advised to come to ER if symptoms recur.    Cora Collum, MD

## 2012-01-28 NOTE — Consults (Signed)
Dictated,#[id number] stable for discharge to out pt. Neuro f/up.

## 2012-01-28 NOTE — Progress Notes (Signed)
Discharged to home accompanied by staff, belongings with the patient.

## 2012-01-28 NOTE — Discharge Instructions (Addendum)
How To Quit Smoking  Smoking is one of the hardest habits to break. About half of all those who have ever smoked have been able to quit, and most of those (about 70%) who still smoke want to quit. Here are some of the best ways to stop smoking.    Keep Trying:   It takes most smokers about 8 tries before they are finally able to fully quit. So, the more often you try and fail, the better your chance of quitting the next time! So, don't give up!  Go Cold [place]:   Most ex-smokers quit cold [place]. Trying to cut back gradually doesn't seem to work as well, perhaps because it continues the smoking habit. Also, it is possible to fool yourself by inhaling more while smoking fewer cigarettes. This results in the same amount of nicotine in your body!  Get Support:   Support programs can make an important difference, especially for the heavy smoker. These groups offer lectures, methods to change your behavior and peer support. Call the free national Quitline for more information. 800-QUIT-NOW (708)568-4245). Low-cost or free programs are offered by many hospitals, local chapters of the American Lung Association (623)117-5818) and the American Cancer Society 507 831 0262). Support at home is important too. Non-smokers can help by offering praise and encouragement. If the smoker fails to quit, encourage them to try again!  Over-The-Counter Medicines:   For those who can't quit on their own, Nicotine Replacement Therapy (NRT) may make quitting much easier. Certain aids such as the nicotine patch, gum and lozenge are available without a prescription. However, it is best to use these under the guidance of your doctor. The skin patch provides a steady supply of nicotine to the body. Nicotine gum and lozenge gives temporary bursts of low levels of nicotine. Both methods take the edge off the craving for cigarettes. WARNING: If you feel symptoms of nicotine overdose, such as nausea, vomiting, dizziness, weakness, or fast  heartbeat, stop using these and see your doctor.  Prescription Medicines:   After evaluating your smoking patterns and prior attempts at quitting, your doctor may offer a prescription medicine such as bupropion (Zyban, Wellbutrin), varenicline (Chantix, Champix), a niocotine inhaler or nasal spray. Each has its unique advantage and side effects which your doctor can review with you.  Health Benefits Of Quitting:   The benefits of quitting start right away and keep improving the longer you go without smoking:   20 minutes: blood pressure and pulse return to normal   8 hours: oxygen levels return to normal   2 days: ability to smell and taste begins to improve as damaged nerves start to regrow   2-3 weeks: circulation and lung function improves   1-9 months: decreased cough, congestion and shortness of breath; less tired   1 year: risk of heart attack decreases by half   5 years: risk of lung cancer decreases by half; risk of stroke becomes the same as a non-smoker  For information about how to quit smoking, visit the following links:   [employer] , " [employer], Quit Smoking Today " - an online booklet. https://www.brown.net/.pdf   Smokefree.gov https://www.nelson-thomas.biz/   [employer]uit[employer]fr   [address], [address], [place], [place] [address]. All rights reserved. This information is not intended as a substitute for professional medical care. Always follow your healthcare professional's instructions.    TIA    You have been diagnosed with a Transient Ischemic Attack (TIA), otherwise known as  a "mini-stroke."    A TIA is caused by a blood clot or plaque (stuff that builds up inside of arteries) that blocks off a small blood vessel in the brain. This causes that area to be deprived of blood and oxygen. This is very similar to what happens in a stroke, however with a TIA, the process reverses itself and the symptoms resolve. It is  possible for a TIA to happen again.    Some patients with TIA are admitted to the hospital. Other patients are sent home with instructions to obtain further tests in the next 1-3 days.    It is very important that you follow-up with your regular doctor or the referral physician as directed by the medical staff here. If tests are scheduled for you (ultrasound, MRI scan, etc.) it is very important that you have the tests done.    When patients with TIAs are sent home, they are often placed on medications to prevent the blood from clotting as easily as usual. These medications are generally referred to as "anti-platelet " drugs. They include aspirin and Plavix (clopidogrel), but other drugs may also be prescribed. It is very important that you take the medications as directed because they can help reduce recurrence of TIAs and strokes.    YOU SHOULD SEEK MEDICAL ATTENTION IMMEDIATELY, EITHER HERE OR AT THE NEAREST EMERGENCY DEPARTMENT, IF ANY OF THE FOLLOWING OCCURS:   Numbness (loss of sensation) or tingling (pins and needles feeling) in your arm, leg or face.   Weakness or paralysis (inability to move) of any body part.   Difficulty with coordination or balance.   Severe dizziness (spinning sensation).   Change in ability to speak or sudden problems expressing yourself.   Difficulty swallowing.   Change in the general level of alertness.   Severe headache.      MEDICATION: ASPIRIN  Aspirin is useful for fever, pain, and inflammation. When taken in small doses every day, it also has the benefit of reducing the risk of heart attack and stroke. Buffered aspirin (brand names: Ascriptin, Ecotrin, and others) contains an antacid to reduce stomach irritation. Enteric-coated aspirin has a protective film that prevents it from dissolving until it passes out of the stomach. Both types of aspirin reduce the risk of a bleeding ulcer.  DIRECTIONS FOR USE:  Aspirin should be taken with a full glass of water, or with  food or milk to reduce stomach upset.  Children and teenagers should not take aspirin while sick with chickenpox or any viral or flu-like illness since it may cause liver damage; use acetaminophen (Tylenol) instead.  WHAT TO WATCH FOR:  POSSIBLE SIDE EFFECTS: Nausea, upper abdominal pain (Take this medicine with food. Contact your doctor if these symptoms persist or become severe). Bleeding from the stomach [may appear as blood in vomit or stool (red or black color)], easy bruising, drowsiness, or repeated vomiting (Contact your doctor or return to this facility promptly).  ALLERGIC REACTIONS: Rash, itching, swelling, trouble breathing or swallowing (Contact your doctor or return to this facility promptly).  MEDICAL CONDITIONS: Before starting this medicine, be sure your doctor knows if you have any of the following conditions:   Stomach ulcer (active or in the past), history of vomiting blood or bloody stool   Sensitivity to aspirin or anti-inflammatory medicines (ibuprofen, Motrin, Naprosyn, etc.)   Asthma, nasal polyps, angioedema, liver or kidney disease; bleeding disorder, pregnancy or breastfeeding  DRUG INTERACTIONS: Before starting this medicine, be sure your doctor knows  if you are taking any of the following drugs:   Coumadin (warfarin), diabetes pills, prednisone, other anti-inflammatory drugs (ibuprofen, Motrin, Naprosyn, etc.), Axid, methotrexate, probenecid, Depakene (valproic acid)   ACE inhibitors [Lotensin (benazepril), Capoten (captopril), Vasotec (enalapril), and others]   Beta-blockers [Inderal (propranolol), Tenormin (atenolol), Lopressor (metoprolol), and others]  WARNINGS:   Daily aspirin use in medium or high doses carries the risk of causing a bleeding ulcer. Buffered or enteric-coated aspirin helps prevent this problem.   Do not take this medicine with prednisone, other anti-inflammatory drugs, or alcohol since this increases the risk of a bleeding ulcer.  [NOTE: This information  topic may not include all directions, precautions, medical conditions, drug/food interactions, and warnings for this drug. Check with your doctor, nurse, or pharmacist for any questions that you may have.]   [address], [place], [place] [address]. All rights reserved. This information is not intended as a substitute for professional medical care. Always follow your healthcare professional's instructions.        Discharge Instructions for Stroke  You have been diagnosed with stroke, also known as a brain attack. During a stroke, blood stops flowing to part of your brain. This can damage areas in the brain that control other parts of the body. Symptoms after a stroke depend on which part of the brain has been affected.  Stroke Risk Factors  Once you've had a stroke, you're at greater risk for another one. Listed below are some other factors that can increase your risk for another stroke:   High blood pressure   High blood cholesterol   Cigarette or cigar smoking   Diabetes   Carotid or other artery disease   Atrial fibrillation or other heart disease   Physical inactivity   Obesity   Certain blood disorders (such as sickle cell anemia)   Excessive alcohol use   Abuse of illegal drugs  Changes in Daily Living  Performing your regular tasks may be difficult after you've had a stroke, but you can learn new ways to manage your daily activities. In fact, doing daily activities may help you to regain muscle strength and bring back function to affected limbs. Be patient, give yourself time to adjust, and appreciate the progress you make.  Daily Activities     You may be at risk of falling. Make changes to your home to help you walk more easily. A therapist will decide if you need an assistive device to walk safely.   You may need to see an occupational or physical therapist to learn new ways of doing things. For example, you may need to make adjustments when bathing or  dressing.   Try the following tips for showering or bathing:   Test the water temperature with a hand or foot that was not affected by the stroke.   Use grab bars, a shower seat, a hand-held showerhead, and a long-handled brush.   Try the following tips for dressing:   Dress while sitting, starting with the affected side or limb.   Wear shirts that pull easily over your head and pants or skirts with elastic waistbands.   Use zippers with loops attached to the pull tabs.  Lifestyle Changes   Take your medications exactly as directed. Don't skip doses.   Change your diet if your doctor tells you to. Your doctor may suggest that you cut back on salt. If so, here are some tips:   Limit canned, dried, packaged, and fast foods. These  tend to be high in salt.   When you cook, season foods with herbs instead of salt.   Don't add salt to your food at the table.   Begin an exercise program. Ask your doctor how to get started. You can benefit from simple activities such as walking or gardening.   Have no more than 2 alcoholic drinks a day.   Know your cholesterol level. Follow your doctor's recommendations about how to keep cholesterol under control.   If you are a smoker, break the smoking habit. Enroll in a stop-smoking program to improve your chances of success. Ask your doctor about medications or other methods to help you quit.  Follow-Up   Keep your medical appointments. Close follow-up is important to stroke rehabilitation and recovery.   Some medications require blood tests to check for progress or problems. Keep follow-up appointments for any blood tests ordered by your doctors.    When to Seek Medical Care  Call 911 right awayif you have any of the following:   Weakness, tingling, or loss of feeling on one side of your face or body   Sudden double vision or trouble seeing in one or both eyes   Sudden trouble talking or slurred speech   Trouble understanding others   Sudden, severe  headache   Dizziness, loss of balance, or a sense of falling   Blackouts    [address], [place], [place] [address]. All rights reserved. This information is not intended as a substitute for professional medical care. Always follow your healthcare professional's instructions.

## 2012-01-29 NOTE — Consults (Signed)
Service Date: 01/28/2012     Patient Type: V     CONSULTING PHYSICIAN: Everlean Patterson MD     REFERRING PHYSICIAN:      HISTORY OF PRESENT ILLNESS:  The patient is a 41 year old I was asked to see in consultation for  neurological evaluation, episode of weakness, shakiness of the left upper  extremity.  Also patient stated that he felt slightly lightheaded when he  got up.  He stated he was sitting at a bar drinking beer when he felt that  his left arm was not holding can as good.  He stated he felt weak.  He  shook his hand and then noticed that he was having a problem holding or  grabbing things with the left hand.  He stated he raised his arm up and  felt that his arm was a little shaky.  He did not notice any numbness in  the face.  No tongue numbness or any speech impairment.  No visual  impairment.  He stated he stood up.  He tried to stretch his body.  He felt  lightheaded for a few seconds.  No headache.  No chest pain.  He stated he  had 1 previous episode when his left side went numb a few years ago.  At  that time, he stated his face also went numb.  He stated he has a history  of Chiari malformation.     He stated that he felt improved now.  No double vision.  No vertigo  associated with this.  No swallowing difficulty.     PAST MEDICAL HISTORY:  History of cluster migraine headache, history of hypertension, history of  gastroesophageal reflux, history of remote meningitis, and also  Arnold-Chiari type 1.     PAST SURGICAL HISTORY:  Sinus surgery, foot surgery, ACI repair, vasectomy.     FAMILY HISTORY:  Father suffered a stroke in his 36s.     SOCIAL HISTORY:  He stated that he just socially drinks alcohol.  He admits smoking.  He  stated he has quit now since after this event.  He had been smoking 0.2  packs per day.     SOCIAL HISTORY:  He is married.  Denies any drug abuse.  He is retired from [employer]     ALLERGIES:  None.     MEDICATIONS:  Gabapentin p.r.n., lisinopril 10 mg, omeprazole.     REVIEW  OF SYSTEMS:  No significant weight loss, weight gain.  He stated he has been staying  active.  He stated he does 3 or 4  of treadmill every day.  No weight  change.  No significant headache on the present day.  He did have history  of migraine and cluster migraine in the past.  He denies any double vision.   No speech impairment.  History of 1 previous episode of numbness in the  left side.  According to patient, at that time he was worked up.  He stated  his left face was also numb at that time.  He stated he gets anxious and  nervous from time to time.  He stated this episode also made him feel  nervous.  No depression.  No swallowing difficulty.  History of  Arnold-Chiari malformation.  No nystagmus.     PHYSICAL EXAMINATION:  VITAL SIGNS:  Blood pressure was 140/83, pulse was 80, respiratory rate was  17, saturation 99.  GENERAL:  very pleasant gentleman who was awake, alert, and oriented in  time, person and place.  Normal comprehension.  Normal speech.  No carotid  bruit.  CARDIOVASCULAR:  Normal first and second heart sound.  CHEST:  Moves symmetrical.  ABDOMEN:  Soft.  NEUROLOGIC:  Speech clear.  Moving all 4 extremities.  Normal motor tone.   Normal coordination.  Pupils round and reactive at about 4 mm.  Extraocular  movements full.  Visual fields full.  No nystagmus.  No ataxia.  Reflexes  and motor coordination all normal.     LABORATORY DATA:  White cell count 7.47, hemoglobin 14, hematocrit 41, platelet 165.  Glucose 82, BUN 14, creatinine 1.4, sodium 139.  LFTs within normal limits.   PT 13.8, INR 1.1, PTT 26.  Troponin 0.02, CK 525.     IMAGING DATA:  CT scan of head:  No bleed.  Chiari 1 malformation.  MRI scan of the head  was done consistent with Chiari malformation type 1, no acute bleed.   Carotid ultrasound:  Mild disease.  No hemodynamically significant  stenosis.     ASSESSMENT AND PLAN:  About a 41 year old with history of hypertension, retired [employer] development [employer].  History  of previous episode of  left-sided numbness.  At that time, patient stated  he also had numbness in the face.  This time he did not have any numbness  in the face, arm numbness, weakness.  Some shakiness, history of Chiari  type 1 malformation.  Transient ischemic attack can be considered, but does  not appear very likely because there was not any speech impairment.  No  facial or tongue numbness.  Could be transient neuropathy.  Chiari 1  malformation appears stable.  The patient advised outpatient neurological  followup.  He is to take 81 mg aspirin in a day and also to continue with  lisinopril for hypertension management.  Otherwise, he appeared to be  stable.     Thank you for this consultation.           D:  01/28/2012 15:05 PM by Dr. Br[place] Stage[place] ([id number])  T:  01/29/2012 02:43 AM by [id number]      Everlean Cherry: [id number]) (Doc ID: [id number])

## 2012-05-29 ENCOUNTER — Emergency Department
Admission: EM | Admit: 2012-05-29 | Discharge: 2012-05-29 | Disposition: A | Discharge status: Home / Self Care | Payer: Enrolled Prime—HMO | Attending: Emergency Medicine | Admitting: Emergency Medicine

## 2012-05-29 ENCOUNTER — Emergency Department: Payer: Enrolled Prime—HMO

## 2012-05-29 DIAGNOSIS — I10 Essential (primary) hypertension: Secondary | ICD-10-CM | POA: Insufficient documentation

## 2012-05-29 DIAGNOSIS — K219 Gastro-esophageal reflux disease without esophagitis: Secondary | ICD-10-CM | POA: Insufficient documentation

## 2012-05-29 DIAGNOSIS — Z87891 Personal history of nicotine dependence: Secondary | ICD-10-CM | POA: Insufficient documentation

## 2012-05-29 DIAGNOSIS — R109 Unspecified abdominal pain: Secondary | ICD-10-CM | POA: Insufficient documentation

## 2012-05-29 LAB — URINE MICROSCOPIC

## 2012-05-29 LAB — CBC AND DIFFERENTIAL
Basophils Absolute Automated: 0.03 10*3/uL (ref 0.00–0.20)
Basophils Automated: 0 %
Eosinophils Absolute Automated: 0.31 10*3/uL (ref 0.00–0.70)
Eosinophils Automated: 4 %
Hematocrit: 43.2 % (ref 42.0–52.0)
Hgb: 14.9 g/dL (ref 13.0–17.0)
Immature Granulocytes Absolute: 0.02 10*3/uL
Immature Granulocytes: 0 %
Lymphocytes Absolute Automated: 2.67 10*3/uL (ref 0.50–4.40)
Lymphocytes Automated: 33 %
MCH: 29 pg (ref 28.0–32.0)
MCHC: 34.5 g/dL (ref 32.0–36.0)
MCV: 84 fL (ref 80.0–100.0)
MPV: 9 fL — ABNORMAL LOW (ref 9.4–12.3)
Monocytes Absolute Automated: 0.73 10*3/uL (ref 0.00–1.20)
Monocytes: 9 %
Neutrophils Absolute: 4.32 10*3/uL (ref 1.80–8.10)
Neutrophils: 54 %
Platelets: 178 10*3/uL (ref 140–400)
RBC: 5.14 10*6/uL (ref 4.70–6.00)
RDW: 14 % (ref 12–15)
WBC: 8.06 10*3/uL (ref 3.50–10.80)

## 2012-05-29 LAB — URINALYSIS
Bilirubin, UA: NEGATIVE
Glucose, UA: NEGATIVE
Ketones UA: NEGATIVE
Leukocyte Esterase, UA: NEGATIVE
Nitrite, UA: NEGATIVE
Specific Gravity UA: 1.016 (ref 1.001–1.035)
Urine pH: 6 (ref 5.0–8.0)
Urobilinogen, UA: 0.2 mg/dL

## 2012-05-29 LAB — COMPREHENSIVE METABOLIC PANEL
ALT: 22 U/L (ref 0–55)
AST (SGOT): 25 U/L (ref 5–34)
Albumin/Globulin Ratio: 1.1 (ref 0.9–2.2)
Albumin: 3.8 g/dL (ref 3.5–5.0)
Alkaline Phosphatase: 95 U/L (ref 40–150)
Anion Gap: 12 (ref 5.0–15.0)
BUN: 14.8 mg/dL (ref 9.0–21.0)
Bilirubin, Total: 0.4 mg/dL (ref 0.2–1.2)
CO2: 24 mEq/L (ref 22–29)
Calcium: 9.8 mg/dL (ref 8.5–10.5)
Chloride: 105 mEq/L (ref 98–107)
Creatinine: 1.3 mg/dL (ref 0.7–1.3)
Globulin: 3.4 g/dL (ref 2.0–3.6)
Glucose: 85 mg/dL (ref 70–100)
Potassium: 4.2 mEq/L (ref 3.5–5.1)
Protein, Total: 7.2 g/dL (ref 6.0–8.3)
Sodium: 141 mEq/L (ref 136–145)

## 2012-05-29 LAB — GFR: EGFR: >60

## 2012-05-29 LAB — LIPASE: Lipase: 45 U/L (ref 8–78)

## 2012-05-29 MED ORDER — IBUPROFEN 800 MG PO TABS
800.0000 mg | ORAL_TABLET | Freq: Three times a day (TID) | ORAL | Status: AC | PRN
Start: 2012-05-29 — End: 2012-06-08

## 2012-05-29 MED ORDER — ONDANSETRON HCL 4 MG/2ML IJ SOLN
4.0000 mg | Freq: Once | INTRAMUSCULAR | Status: AC
Start: 2012-05-29 — End: 2012-05-29
  Administered 2012-05-29: 4 mg via INTRAVENOUS

## 2012-05-29 MED ORDER — KETOROLAC TROMETHAMINE 30 MG/ML IJ SOLN
30.0000 mg | Freq: Once | INTRAMUSCULAR | Status: AC
Start: 2012-05-29 — End: 2012-05-29
  Administered 2012-05-29: 30 mg via INTRAVENOUS
  Filled 2012-05-29: qty 1

## 2012-05-29 MED ORDER — ONDANSETRON 4 MG PO TBDP
4.0000 mg | ORAL_TABLET | Freq: Four times a day (QID) | ORAL | Status: AC | PRN
Start: 2012-05-29 — End: 2012-06-05

## 2012-05-29 MED ORDER — SODIUM CHLORIDE 0.9 % IV BOLUS
1000.00 mL | Freq: Once | INTRAVENOUS | Status: AC
Start: 2012-05-29 — End: 2012-05-29
  Administered 2012-05-29: 1000 mL via INTRAVENOUS

## 2012-05-29 NOTE — Discharge Instructions (Signed)
Abdominal Pain    You have been diagnosed with abdominal (belly) pain. The cause of your pain is not yet known.    Many things can cause abdominal pain. Examples include viral infections and bowel (intestine) spasms. You might need another examination or more tests to find out why you have pain.    At this time, your pain does not seem to be caused by anything dangerous. You do not need surgery. You do not need to stay in the hospital.     Though we don't believe your condition is dangerous right now, it is important to be careful. Sometimes a problem that seems mild can become serious later. This is why it is very important that you return here or go to the nearest Emergency Department unless you are 100% improved.    Return here or go to the nearest Emergency Department, or follow up with your physician in:   24 hours.    Drink only clear liquids such as water, clear broth, sports drinks, or clear caffeine-free soft drinks, like 7-Up or Sprite, for the next:   24 hours.    YOU SHOULD SEEK MEDICAL ATTENTION IMMEDIATELY, EITHER HERE OR AT THE NEAREST EMERGENCY DEPARTMENT, IF ANY OF THE FOLLOWING OCCURS:   Your pain does not go away or gets worse.   You cannot keep fluids down or your vomit is dark green.    You vomit blood or see blood in your stool. Blood might be bright red or dark red. It can also be black and look like tar.   You have a fever or shaking chills.   Your skin or eyes look yellow or your urine looks brown.   You have severe diarrhea.

## 2012-05-29 NOTE — ED Notes (Signed)
Lower mid abdominal pain/suprapubic pain since yesterday. Unrelieved with otc gas medication. Denies hematuria or dysuria.

## 2012-05-30 NOTE — ED Provider Notes (Signed)
Physician/Midlevel provider first contact with patient: 05/29/12 1430         History     Chief Complaint   Patient presents with   . Abdominal Pain     HPI Comments: Pt here with vague lower/suprapubic abd crampy pain intermittently since yesterday, "like gas bubble"  Mild N no V, F,urinary c/o    Patient is a 42 y.o. male presenting with abdominal pain. The history is provided by the patient. No language interpreter was used.   Abdominal Pain  The primary symptoms of the illness include abdominal pain and nausea. The primary symptoms of the illness do not include fever, fatigue, shortness of breath, vomiting, diarrhea or dysuria. The current episode started yesterday. The onset of the illness was gradual. The problem has not changed since onset.  The abdominal pain began yesterday. The pain came on gradually. The abdominal pain is located in the suprapubic region. The abdominal pain does not radiate. The severity of the abdominal pain is 3/10. The abdominal pain is relieved by nothing. Exacerbated by: nothing.   The patient has not had a change in bowel habit. Symptoms associated with the illness do not include chills, anorexia, constipation, urgency, hematuria, frequency or back pain. Significant associated medical issues do not include PUD or GERD.       Past Medical History   Diagnosis Date   . Cluster headache    . Hypertensive disorder    . Acid reflux    . Meningitis    . Arnold-Chiari malformation, type I        Past Surgical History   Procedure Date   . Sinus surgery    . Foot surgery    . Acl repair    . Vasectomy        No family history on file.    Social  History   Substance Use Topics   . Smoking status: Former Smoker -- 20 years   . Smokeless tobacco: Not on file      Comment: 2-3 cigarettes/day   . Alcohol Use: Yes      Comment: social       .     No Known Allergies    Current/Home Medications    ASPIRIN EC 81 MG EC TABLET    Take 1 tablet (81 mg total) by mouth daily.    BUPROPION HCL PO    Take  by mouth.    GABAPENTIN (NEURONTIN) 100 MG CAPSULE    Take 100 mg by mouth 3 (three) times daily.    IBUPROFEN (ADVIL,MOTRIN) 200 MG TABLET    Take 200 mg by mouth every 6 (six) hours as needed.    LISINOPRIL (PRINIVIL,ZESTRIL) 10 MG TABLET    Take 10 mg by mouth daily.    OMEPRAZOLE (PRILOSEC) 40 MG CAPSULE    Take 40 mg by mouth daily.        Review of Systems   Constitutional: Negative for fever, chills and fatigue.   HENT: Negative for ear pain, congestion, sore throat and neck pain.    Eyes: Negative for pain and redness.   Respiratory: Negative for cough and shortness of breath.    Cardiovascular: Negative for chest pain and palpitations.   Gastrointestinal: Positive for nausea and abdominal pain. Negative for vomiting, diarrhea, constipation, blood in stool, abdominal distention and anorexia.   Genitourinary: Negative for dysuria, urgency, frequency and hematuria.   Musculoskeletal: Negative for myalgias and back pain.   Skin: Negative for rash.  Neurological: Negative for dizziness and headaches.   Psychiatric/Behavioral: Negative for suicidal ideas and hallucinations.   All other systems reviewed and are negative.        Physical Exam    BP 142/82  Pulse 66  Temp 96.7 F (35.9 C)  Resp 16  Ht 1.829 m  Wt 116.121 kg  BMI 34.71 kg/m2  SpO2 100%    Physical Exam   Constitutional: He is oriented to person, place, and time. He appears well-developed and well-nourished.   HENT:   Head: Normocephalic and atraumatic.   Eyes: Pupils are equal, round, and reactive to light.   Neck: Normal range of motion. Neck supple.   Cardiovascular: Normal rate and regular rhythm.    Pulmonary/Chest: Effort normal and breath sounds normal.   Abdominal: Soft. Bowel sounds are normal. There is no hepatosplenomegaly. There is tenderness in the suprapubic area. There is no rebound, no CVA tenderness, no tenderness at McBurney's point and negative Murphy's sign.   Musculoskeletal: Normal range of motion.   Neurological: He is  alert and oriented to person, place, and time.   Skin: Skin is warm and dry.   Psychiatric: He has a normal mood and affect. His behavior is normal.       MDM and ED Course     ED Medication Orders      Start     Status Ordering Provider    05/29/12 1530   ondansetron [facility]) injection 4 mg   Once      Route: Intravenous  Ordered Dose: 4 mg         Last MAR action:  Given Louis Matte [facility]    05/29/12 1530   ketorolac (TORADOL) injection 30 mg   Once      Route: Intravenous  Ordered Dose: 30 mg         Last MAR action:  Given Louis Matte [facility]    05/29/12 1515   sodium chloride 0.9 % bolus 1,000 mL   Once      Route: Intravenous  Ordered Dose: 1,000 mL         Last MAR action:  New Bag Effie Wahlert GITANJALI                 MDM  Number of Diagnoses or Management Options  Abdominal pain of unknown etiology:   Diagnosis management comments: ILouis Matte, MD, have been the primary provider for Angel [name] during this Emergency Dept visit.    Due to reasons beyond my control in the EPIC system,  timed orders including medications, IV fluids and drips show up as posted 30-45 minutes AFTER they were entered by me.  Nurses know and are notified verbally to give medications STAT in the ED.    Oxygen saturation by pulse oximetry is 95%-100%, Normal.  Interventions: None Needed    DDX: UTI, pyelonephritis,kidney stones, dehydration, electrolyte abnormalities  Plan:  UA, IV fluids, labs, pain meds, re-eval    Labs Reviewed  CBC AND DIFFERENTIAL - Abnormal; Notable for the following:      MPV                           9.0 (*)                     All other components within normal limits  URINALYSIS - Abnormal; Notable for the following:      Protein, UA  TRACE (*)                       Blood, UA                       (*)                                        Value:TRACE-INTACT  All other components within normal limits  COMPREHENSIVE METABOLIC PANEL  LIPASE  GFR  URINE MICROSCOPIC    CT  ABDOMEN PELVIS WO IV/ WO PO CONT         Final Result:  No acute abdominal or pelvic process identified, within          limits of a noncontrast exam.             Elizebeth Koller, MD       05/29/2012 4:01 PM     The patient presents with abdominal pain without signs of peritonitis or other life-threatening or serious etiology. The patient appears stable for discharge and has been instructed to return immediately if the symptoms worsen or change in any way, or in 8-12 hours if not improved for re-evaluation. Abdominal pain warnings were given and I emphasized the need for close follow-up with their primary care physician.           Amount and/or Complexity of Data Reviewed  Clinical lab tests: ordered and reviewed  Tests in the radiology section of CPT: ordered and reviewed  Tests in the medicine section of CPT: ordered and reviewed    Risk of Complications, Morbidity, and/or Mortality  Presenting problems: high  Diagnostic procedures: high  Management options: high    Patient Progress  Patient progress: improved        Procedures    Clinical Impression & Disposition     Clinical Impression  Final diagnoses:   Abdominal pain of unknown etiology        ED Disposition     Discharge Angel [name] discharge to home/self care.    Condition at discharge: Good             New Prescriptions    IBUPROFEN (ADVIL,MOTRIN) 800 MG TABLET    Take 1 tablet (800 mg total) by mouth every 8 (eight) hours as needed for Pain.    ONDANSETRON (ZOFRAN ODT) 4 MG DISINTEGRATING TABLET    Take 1 tablet (4 mg total) by mouth every 6 (six) hours as needed for Nausea.               Oliver Barre, MD  05/30/12 1409

## 2013-05-31 ENCOUNTER — Observation Stay: Payer: Enrolled Prime—HMO | Admitting: Internal Medicine

## 2013-05-31 ENCOUNTER — Emergency Department: Payer: Enrolled Prime—HMO

## 2013-05-31 ENCOUNTER — Observation Stay
Admission: EM | Admit: 2013-05-31 | Discharge: 2013-06-01 | Disposition: A | Discharge status: Home / Self Care | Payer: Enrolled Prime—HMO | Attending: Internal Medicine | Admitting: Internal Medicine

## 2013-05-31 DIAGNOSIS — K219 Gastro-esophageal reflux disease without esophagitis: Secondary | ICD-10-CM | POA: Insufficient documentation

## 2013-05-31 DIAGNOSIS — R079 Chest pain, unspecified: Secondary | ICD-10-CM | POA: Diagnosis present

## 2013-05-31 DIAGNOSIS — Z87891 Personal history of nicotine dependence: Secondary | ICD-10-CM | POA: Insufficient documentation

## 2013-05-31 DIAGNOSIS — I10 Essential (primary) hypertension: Secondary | ICD-10-CM | POA: Insufficient documentation

## 2013-05-31 DIAGNOSIS — R6884 Jaw pain: Secondary | ICD-10-CM | POA: Insufficient documentation

## 2013-05-31 DIAGNOSIS — Z800 Family history of malignant neoplasm of digestive organs: Secondary | ICD-10-CM | POA: Insufficient documentation

## 2013-05-31 DIAGNOSIS — R0789 Other chest pain: Principal | ICD-10-CM | POA: Insufficient documentation

## 2013-05-31 LAB — CBC AND DIFFERENTIAL
Basophils Absolute Automated: 0.03 10*3/uL (ref 0.00–0.20)
Basophils Automated: 0 %
Eosinophils Absolute Automated: 0.26 10*3/uL (ref 0.00–0.70)
Eosinophils Automated: 4 %
Hematocrit: 42.8 % (ref 42.0–52.0)
Hgb: 15 g/dL (ref 13.0–17.0)
Immature Granulocytes Absolute: 0.01 10*3/uL
Immature Granulocytes: 0 %
Lymphocytes Absolute Automated: 2.86 10*3/uL (ref 0.50–4.40)
Lymphocytes Automated: 40 %
MCH: 29.1 pg (ref 28.0–32.0)
MCHC: 35 g/dL (ref 32.0–36.0)
MCV: 82.9 fL (ref 80.0–100.0)
MPV: 9.3 fL — ABNORMAL LOW (ref 9.4–12.3)
Monocytes Absolute Automated: 0.56 10*3/uL (ref 0.00–1.20)
Monocytes: 8 %
Neutrophils Absolute: 3.35 10*3/uL (ref 1.80–8.10)
Neutrophils: 48 %
Platelets: 176 10*3/uL (ref 140–400)
RBC: 5.16 10*6/uL (ref 4.70–6.00)
RDW: 14 % (ref 12–15)
WBC: 7.06 10*3/uL (ref 3.50–10.80)

## 2013-05-31 LAB — COMPREHENSIVE METABOLIC PANEL
ALT: 19 U/L (ref 0–55)
AST (SGOT): 20 U/L (ref 5–34)
Albumin/Globulin Ratio: 1.2 (ref 0.9–2.2)
Albumin: 4.1 g/dL (ref 3.5–5.0)
Alkaline Phosphatase: 94 U/L (ref 40–150)
Anion Gap: 10 (ref 5.0–15.0)
BUN: 15.4 mg/dL (ref 9.0–21.0)
Bilirubin, Total: 0.4 mg/dL (ref 0.2–1.2)
CO2: 23 mEq/L (ref 22–29)
Calcium: 9.6 mg/dL (ref 8.5–10.5)
Chloride: 107 mEq/L (ref 98–107)
Creatinine: 1.1 mg/dL (ref 0.7–1.3)
Globulin: 3.4 g/dL (ref 2.0–3.6)
Glucose: 90 mg/dL (ref 70–100)
Potassium: 4.4 mEq/L (ref 3.5–5.1)
Protein, Total: 7.5 g/dL (ref 6.0–8.3)
Sodium: 140 mEq/L (ref 136–145)

## 2013-05-31 LAB — CK: Creatine Kinase (CK): 301 U/L — ABNORMAL HIGH (ref 30–200)

## 2013-05-31 LAB — CKMB: Creatinine Kinase MB (CKMB): 1.4 ng/mL (ref 0.0–4.9)

## 2013-05-31 LAB — TROPONIN I: Troponin I: <0.01 ng/mL (ref 0.00–0.09)

## 2013-05-31 LAB — GFR: EGFR: >60

## 2013-05-31 MED ORDER — ACETAMINOPHEN 325 MG PO TABS
650.0000 mg | ORAL_TABLET | ORAL | Status: DC | PRN
Start: 2013-05-31 — End: 2013-06-01

## 2013-05-31 MED ORDER — MORPHINE SULFATE 2 MG/ML IJ/IV SOLN (WRAP)
2.0000 mg | Status: DC | PRN
Start: 2013-05-31 — End: 2013-06-01

## 2013-05-31 MED ORDER — ASPIRIN 81 MG PO CHEW
162.0000 mg | CHEWABLE_TABLET | Freq: Once | ORAL | Status: AC
Start: 2013-05-31 — End: 2013-05-31
  Administered 2013-05-31: 162 mg via ORAL
  Filled 2013-05-31: qty 2

## 2013-05-31 MED ORDER — FAMOTIDINE 20 MG PO TABS
20.0000 mg | ORAL_TABLET | Freq: Once | ORAL | Status: AC
Start: 2013-05-31 — End: 2013-05-31
  Administered 2013-05-31: 20 mg via ORAL
  Filled 2013-05-31: qty 1

## 2013-05-31 MED ORDER — SODIUM CHLORIDE 0.9 % IV SOLN
INTRAVENOUS | Status: DC
Start: 2013-05-31 — End: 2013-06-01

## 2013-05-31 NOTE — ED Provider Notes (Addendum)
Physician/Midlevel provider first contact with patient: 05/31/13 1856         History     Chief Complaint   Patient presents with   . Chest Pain     HPI Comments: Pt with no sign pmh but is a smoker and now with L chest pressure last night. Went to sleep and it went away.  Then at work today around 3pm started getting like an air bubble in L chest with pain then started walking to talk to his boss and started getting pain in the jaw as well.  Pains lasted about 10 minutes.  Pt answers that there are no fevers, chills, nausea, vomiting, diarrhea, constipation, trauma or rash.  No history of pulmonary embolus or deep venous thrombosis, no hemoptysis, trauma, or surgery in the past 6 weeks.  No new leg pain or swelling.    Patient is a 43 y.o. male presenting with chest pain. The history is provided by the patient.   Chest Pain  The chest pain began 3 - 5 hours ago. Duration of episode(s) is 10 minutes. Chest pain occurs constantly. The chest pain is resolved. At its most intense, the pain is at 5/10. The pain is currently at 5/10. The quality of the pain is described as pressure-like and aching. The pain radiates to the left jaw. Pertinent negatives for primary symptoms include no fever, no fatigue, no syncope, no shortness of breath, no cough, no wheezing, no palpitations, no abdominal pain, no nausea, no vomiting, no dizziness and no altered mental status.   Pertinent negatives for associated symptoms include no claudication, no diaphoresis, no lower extremity edema, no near-syncope, no numbness, no orthopnea and no weakness. He tried nothing for the symptoms. Risk factors include male gender and smoking/tobacco exposure.   Pertinent negatives for past medical history include no aneurysm, no anxiety/panic attacks, no aortic aneurysm, no aortic dissection, no CAD, no cancer, no COPD, no CHF, no DVT, no hyperlipidemia, no hypertension, no MI, no pacemaker and no PE.   His family medical history is significant for  stroke in family.   Pertinent negatives for family medical history include: no early MI in family and no PE in family.         Past Medical History   Diagnosis Date   . Cluster headache    . Hypertensive disorder    . Acid reflux    . Meningitis    . Arnold-Chiari malformation, type I        Past Surgical History   Procedure Date   . Sinus surgery    . Foot surgery    . Acl repair    . Vasectomy        No family history on file.    Social  History   Substance Use Topics   . Smoking status: Former Smoker -- 20 years   . Smokeless tobacco: Not on file      Comment: 2-3 cigarettes/day   . Alcohol Use: Yes      Comment: social       .     No Known Allergies    Current/Home Medications    ASPIRIN EC 81 MG EC TABLET    Take 81 mg by mouth daily.    BUPROPION HCL PO    Take by mouth.    GABAPENTIN (NEURONTIN) 100 MG CAPSULE    Take 100 mg by mouth 3 (three) times daily.    IBUPROFEN (ADVIL,MOTRIN) 200 MG TABLET  Take 200 mg by mouth every 6 (six) hours as needed.    LISINOPRIL (PRINIVIL,ZESTRIL) 10 MG TABLET    Take 10 mg by mouth daily.    OMEPRAZOLE (PRILOSEC) 40 MG CAPSULE    Take 40 mg by mouth daily.        Review of Systems   Constitutional: Negative for fever, chills, diaphoresis and fatigue.   Eyes: Negative for pain and visual disturbance.   Respiratory: Negative for cough, shortness of breath and wheezing.    Cardiovascular: Positive for chest pain. Negative for palpitations, orthopnea, claudication, leg swelling, syncope and near-syncope.   Gastrointestinal: Negative for nausea, vomiting, abdominal pain and diarrhea.   Genitourinary: Negative for dysuria and hematuria.   Musculoskeletal: Negative for arthralgias, back pain, gait problem, myalgias, neck pain and neck stiffness.   Skin: Negative for rash and wound.   Neurological: Negative for dizziness, syncope, weakness, light-headedness, numbness and headaches.   Hematological: Does not bruise/bleed easily.   Psychiatric/Behavioral: Negative for confusion.  The patient is not nervous/anxious.        Physical Exam    BP: 142/87 mmHg, Heart Rate: 80 , Temp: 98.2 F (36.8 C), Resp Rate: 16 , SpO2: 100 %, Weight: 118.842 kg    Physical Exam   Vitals reviewed.  Constitutional: He is oriented to person, place, and time. He appears well-developed and well-nourished. No distress.   HENT:   Head: Normocephalic and atraumatic.   Right Ear: External ear normal.   Left Ear: External ear normal.   Mouth/Throat: Oropharynx is clear and moist.   Eyes: EOM are normal. Pupils are equal, round, and reactive to light. Right eye exhibits no discharge. Left eye exhibits no discharge.   Neck: Normal range of motion. Neck supple. No tracheal deviation present.   Cardiovascular: Normal rate, regular rhythm, normal heart sounds and intact distal pulses.  Exam reveals no gallop and no friction rub.    No murmur heard.  Pulmonary/Chest: Effort normal and breath sounds normal. No stridor. No respiratory distress. He has no wheezes. He has no rales.   Abdominal: Soft. Bowel sounds are normal. He exhibits no distension. There is no tenderness. There is no rebound and no guarding.   Musculoskeletal: Normal range of motion. He exhibits no edema and no tenderness.   Neurological: He is alert and oriented to person, place, and time. He exhibits normal muscle tone.   Skin: Skin is warm and dry. No rash noted. He is not diaphoretic. No erythema. No pallor.   Psychiatric: He has a normal mood and affect. His behavior is normal. Thought content normal.       MDM and ED Course     ED Medication Orders      Start     Status Ordering Provider    05/31/13 1930   aspirin chewable tablet 162 mg   Once      Route: Oral  Ordered Dose: 162 mg         Last MAR action:  Given Ferdinand Cava    05/31/13 1930   famotidine (PEPCID) tablet 20 mg   Once      Route: Oral  Ordered Dose: 20 mg         Last MAR action:  Given Kai Railsback JAMES                 MDM  Number of Diagnoses or Management Options  Chest  pain:   Jaw pain:   Diagnosis management comments: I,  Ferdinand Cava, MD, have been the primary provider for [facility] D  during this Emergency Dept visit.    Oxygen saturation by pulse oximetry is 95%-100%, Normal, none needed.    DDx:   Dyspepsia vs ACS vs stress vs musculoskeletal vs other.        Rhythm:  Normal Sinus  Ectopy:  None  Rate:  Normal  Conduction:  No blocks  ST Segments:  Normal ST segments  T Waves:  No acute T Wave changes  Axis:  Normal  Q Waves:  None seen  Clinical Impression:  Normal EKG             Amount and/or Complexity of Data Reviewed  Clinical lab tests: ordered and reviewed  Tests in the radiology section of CPT: ordered and reviewed    Risk of Complications, Morbidity, and/or Mortality  Presenting problems: moderate  Diagnostic procedures: moderate          Procedures  Results     Procedure Component Value Units Date/Time    CK-MB [[id number]] Collected:05/31/13 1857     Creatinine Kinase MB (CKMB) 1.4 ng/mL Updated:05/31/13 1941    Troponin I [[id number]] Collected:05/31/13 1857    Specimen Information:Blood Updated:05/31/13 1931     Troponin I <0.01 ng/mL     Comprehensive Metabolic Panel (CMP) [[id number]] Collected:05/31/13 1857    Specimen Information:Blood Updated:05/31/13 1924     Glucose 90 mg/dL      BUN 96.2 mg/dL      Creatinine 1.1 mg/dL      Sodium 952 mEq/L      Potassium 4.4 mEq/L      Chloride 107 mEq/L      CO2 23 mEq/L      CALCIUM 9.6 mg/dL      Protein, Total 7.5 g/dL      Albumin 4.1 g/dL      AST (SGOT) 20 U/L      ALT 19 U/L      Alkaline Phosphatase 94 U/L      Bilirubin, Total 0.4 mg/dL      Globulin 3.4 g/dL      Albumin/Globulin Ratio 1.2      Anion Gap 10.0     CK with MB if indicated [[id number]]  (Abnormal) Collected:05/31/13 1857    Specimen Information:Blood Updated:05/31/13 1924     Creatine Kinase (CK) 301 (H) U/L     GFR [[id number]] Collected:05/31/13 1857     EGFR >60.0 Updated:05/31/13 1924    CBC and differential [[id number]]  (Abnormal)  Collected:05/31/13 1857    Specimen Information:Blood / Blood Updated:05/31/13 1911     WBC 7.06 x10 3/uL      RBC 5.16 x10 6/uL      Hgb 15.0 g/dL      Hematocrit 56.3 %      MCV 82.9 fL      MCH 29.1 pg      MCHC 35.0 g/dL      RDW 14 %      Platelets 176 x10 3/uL      MPV 9.3 (L) fL      Neutrophils 48 %      Lymphocytes Automated 40 %      Monocytes 8 %      Eosinophils Automated 4 %      Basophils Automated 0 %      Immature Granulocyte 0 %      Neutrophils Absolute 3.35 x10 3/uL      Abs Lymph Automated 2.86 x10 3/uL  Abs Mono Automated 0.56 x10 3/uL      Abs Eos Automated 0.26 x10 3/uL      Absolute Baso Automated 0.03 x10 3/uL      Absolute Immature Granulocyte 0.01 x10 3/uL         Radiology Results (24 Hour)     Procedure Component Value Units Date/Time    Chest 2 Views [[id number]] Collected:05/31/13 1944    Order Status:Completed  Updated:05/31/13 1949    Narrative:    CLINICAL INFORMATION: 43 year old. Left-sided chest pressure. History of  smoking.    TECHNIQUE AND FINDINGS: Chest: PA and lateral. No comparisons.    Lung volumes within normal limits. No pneumonia, atelectasis, or mass.  Bronchial and parenchymal markings minimally increased consistent with  smoking history. No evidence of interstitial fibrosis. No demonstrable  bulla. No pleural, hilar, or mediastinal abnormality. Normal heart size.  No indication of CHF. No significant abnormality of the aorta. No  destructive bone lesions.      Impression:     Mild increase in bronchial and pulmonary markings consistent  with smoking history. No plain film evidence of nodule or mass. No acute  findings.    Annabell Sabal, MD   05/31/2013 7:45 PM            Clinical Impression & Disposition     Clinical Impression  Final diagnoses:   Chest pain   Jaw pain        ED Disposition     Admit to [facility]            New Prescriptions    No medications on file                 Ferdinand Cava, MD  05/31/13 2034    Ferdinand Cava, MD  06/14/13  1130

## 2013-05-31 NOTE — ED Notes (Signed)
Oxygen 2L/min NC placed on patient.

## 2013-05-31 NOTE — ED Notes (Signed)
Pt co cramping L sided chest pains started at 1430 lasting about 10 min. Pt states pain radiates to jaw. Denies sob, dizziness, or n/v.

## 2013-05-31 NOTE — Plan of Care (Signed)
Problem: Pain  Goal: Patient's pain/discomfort is manageable  Outcome: Progressing  Pt goal of care is relief of chest pain, reduction of anxiety  Pt educated on prescribed care   Monitor pt for changes on the heart monitor  Plan pt activities to minimize chest pain episodes

## 2013-06-01 DIAGNOSIS — I10 Essential (primary) hypertension: Secondary | ICD-10-CM

## 2013-06-01 DIAGNOSIS — R6884 Jaw pain: Secondary | ICD-10-CM

## 2013-06-01 DIAGNOSIS — R079 Chest pain, unspecified: Secondary | ICD-10-CM

## 2013-06-01 LAB — COMPREHENSIVE METABOLIC PANEL
ALT: 18 U/L (ref 0–55)
AST (SGOT): 18 U/L (ref 5–34)
Albumin/Globulin Ratio: 1.2 (ref 0.9–2.2)
Albumin: 3.8 g/dL (ref 3.5–5.0)
Alkaline Phosphatase: 84 U/L (ref 40–150)
Anion Gap: 11 (ref 5.0–15.0)
BUN: 14.7 mg/dL (ref 9.0–21.0)
Bilirubin, Total: 0.5 mg/dL (ref 0.2–1.2)
CO2: 23 mEq/L (ref 22–29)
Calcium: 9.5 mg/dL (ref 8.5–10.5)
Chloride: 106 mEq/L (ref 98–107)
Creatinine: 1.1 mg/dL (ref 0.7–1.3)
Globulin: 3.1 g/dL (ref 2.0–3.6)
Glucose: 108 mg/dL — ABNORMAL HIGH (ref 70–100)
Potassium: 4.5 mEq/L (ref 3.5–5.1)
Protein, Total: 6.9 g/dL (ref 6.0–8.3)
Sodium: 140 mEq/L (ref 136–145)

## 2013-06-01 LAB — CBC AND DIFFERENTIAL
Basophils Absolute Automated: 0.03 10*3/uL (ref 0.00–0.20)
Basophils Automated: 0 %
Eosinophils Absolute Automated: 0.26 10*3/uL (ref 0.00–0.70)
Eosinophils Automated: 4 %
Hematocrit: 41.9 % — ABNORMAL LOW (ref 42.0–52.0)
Hgb: 14.5 g/dL (ref 13.0–17.0)
Immature Granulocytes Absolute: 0.02 10*3/uL
Immature Granulocytes: 0 %
Lymphocytes Absolute Automated: 2.33 10*3/uL (ref 0.50–4.40)
Lymphocytes Automated: 36 %
MCH: 28.8 pg (ref 28.0–32.0)
MCHC: 34.6 g/dL (ref 32.0–36.0)
MCV: 83.1 fL (ref 80.0–100.0)
MPV: 9.3 fL — ABNORMAL LOW (ref 9.4–12.3)
Monocytes Absolute Automated: 0.51 10*3/uL (ref 0.00–1.20)
Monocytes: 8 %
Neutrophils Absolute: 3.35 10*3/uL (ref 1.80–8.10)
Neutrophils: 52 %
Platelets: 161 10*3/uL (ref 140–400)
RBC: 5.04 10*6/uL (ref 4.70–6.00)
RDW: 14 % (ref 12–15)
WBC: 6.48 10*3/uL (ref 3.50–10.80)

## 2013-06-01 LAB — MAGNESIUM: Magnesium: 2 mg/dL (ref 1.6–2.6)

## 2013-06-01 LAB — PT AND APTT
PT INR: 1.1
PT: 14.3 s (ref 12.6–15.0)
PTT: 27 s (ref 23–37)

## 2013-06-01 LAB — LIPID PANEL
Cholesterol / HDL Ratio: 3.9
Cholesterol: 181 mg/dL (ref 0–199)
HDL: 46 mg/dL (ref >40)
LDL Calculated: 125 mg/dL — ABNORMAL HIGH (ref 0–99)
Triglycerides: 49 mg/dL (ref 34–149)
VLDL Calculated: 10 mg/dL (ref 10–40)

## 2013-06-01 LAB — CKMB
Creatinine Kinase MB (CKMB): 1 ng/mL (ref 0.0–4.9)
Creatinine Kinase MB (CKMB): 1 ng/mL (ref 0.0–4.9)

## 2013-06-01 LAB — HEMOLYSIS INDEX: Hemolysis Index: 5 (ref 0–18)

## 2013-06-01 LAB — TROPONIN I
Troponin I: <0.01 ng/mL (ref 0.00–0.09)
Troponin I: <0.01 ng/mL (ref 0.00–0.09)

## 2013-06-01 LAB — CK
Creatine Kinase (CK): 259 U/L — ABNORMAL HIGH (ref 30–200)
Creatine Kinase (CK): 239 U/L — ABNORMAL HIGH (ref 30–200)

## 2013-06-01 LAB — GFR: EGFR: >60

## 2013-06-01 MED ORDER — ONDANSETRON HCL 4 MG/2ML IJ SOLN
4.0000 mg | Freq: Four times a day (QID) | INTRAMUSCULAR | Status: DC | PRN
Start: 2013-06-01 — End: 2013-06-01

## 2013-06-01 MED ORDER — SODIUM CHLORIDE 0.9 % IV SOLN
INTRAVENOUS | Status: AC
Start: 2013-06-01 — End: 2013-06-01

## 2013-06-01 NOTE — Plan of Care (Signed)
Problem: Health Promotion  Goal: Vaccination Screening  All patients will be screened for current vaccination status on each admission.   Outcome: Completed Date Met:  06/01/13  Pt declines vaccinations.   Goal: Knowledge - disease process  Extent of understanding conveyed about a specific disease process.   Outcome: Progressing  Pt provided with education handouts regarding chest pain cardiac and non-cardiac causes.   Goal: Risk control - tobacco abuse  Actions to eliminate or reduce tobacco use.   Outcome: Completed Date Met:  06/01/13  States that he no longer uses tobacco products.   Goal: Knowledge - health resources  Extent of understanding and conveyed about healthcare resources.   Outcome: Completed Date Met:  06/01/13  Pt denies any discharge issues or concerns.     Problem: Safety  Goal: Patient will be free from injury during hospitalization  Outcome: Progressing  Pt uses call bell appropriately for assistance.     Problem: Pain  Goal: Patient's pain/discomfort is manageable  Outcome: Progressing  Denies pain or discomfort at this time.

## 2013-06-01 NOTE — H&P (Signed)
Medicine Admission H&P    Date Time: 06/01/2013 9:43 AM  Patient Name: Angel [name]  Attending Physician: Jackquline Bosch, MD    Chief Complaint:     Chest discomfort and jaw pain     History of Present Illness:     43 yo Gentleman wit hhx of GERD and HTN admitted for the evaluation of the chest discomfort for 1 day. Chest discomfort left sided, intermittent. Now resolved. C/o gas bubble like pain.   Hx of smoking and cancer in the family.         Past Medical History:      Past Medical History   Diagnosis Date   . Cluster headache    . Hypertensive disorder    . Acid reflux    . Meningitis    . Arnold-Chiari malformation, type I        Past Surgical History:      Past Surgical History   Procedure Date   . Sinus surgery    . Foot surgery    . Acl repair    . Vasectomy        Family History:    Family hx of throat cancer     Social History:      History     Social History   . Marital Status: Married     Spouse Name: N/A     Number of Children: N/A   . Years of Education: N/A     Occupational History   . Not on file.     Social History Main Topics   . Smoking status: Former Smoker -- 20 years   . Smokeless tobacco: Not on file      Comment: 2-3 cigarettes/day   . Alcohol Use: Yes      Comment: social   . Drug Use: No   . Sexually Active:      Other Topics Concern   . Not on file     Social History Narrative   . No narrative on file         Allergies:    No Known Allergies      Medications:      Prescriptions prior to admission   Medication Sig   . aspirin EC 81 MG EC tablet Take 81 mg by mouth daily.   . BUPROPION HCL PO Take 1 tablet by mouth 2 (two) times daily.    [name] gabapentin (NEURONTIN) 100 MG capsule Take 100 mg by mouth 3 (three) times daily.   [name] ibuprofen (ADVIL,MOTRIN) 200 MG tablet Take 200 mg by mouth every 6 (six) hours as needed.   [name] lisinopril (PRINIVIL,ZESTRIL) 10 MG tablet Take 10 mg by mouth daily.   [name] omeprazole (PRILOSEC) 40 MG capsule Take 40 mg by mouth daily.       Review of Systems:     Constitutional: Negative for fever, chills and malaise/fatigue.   HENT: Negative for congestion, sore throat, neck pain and tinnitus. jaw pain ++   Eyes: Negative for blurred vision and discharge.   Respiratory: Negative for cough, sputum production, shortness of breath and wheezing.   Cardiovascular: +chest discomfort, palpitations and leg swelling.   Gastrointestinal: Negative for heartburn, NV, abdominal pain, diarrhea and constipation.   Genitourinary: Negative for dysuria, urgency and frequency.   Musculoskeletal: Negative for myalgias, back pain and falls.   Skin: Negative for rash.   Neurological: Negative for dizziness, tingling, sensory change, focal weakness, seizures, loss of consciousness, weakness and headaches.  Endo/Heme/Allergies: Does not bruise/bleed easily.   Psychiatric/Behavioral: Negative for depression, suicidal ideas, memory loss and substance abuse. The patient is not nervous/anxious.     Physical Exam:    BP 113/74  Pulse 60  Temp 32.5 F (0.3 C) (Temporal Artery)  Resp 19  Ht 2.159 m (7\' 1" )  Wt 121.428 kg (267 lb 11.2 oz)  BMI 26.05 kg/m2  SpO2 96%    General appearance - alert, well appearing, and in no distress   Eyes - Conjunctiva normal, pupils equal and reactive, extraocular eye movements intact   Mouth - mucous membranes moist, pharynx normal without lesions   Chest - clear to auscultation, no wheezes, rales or rhonchi, symmetric air entry   Heart - normal rate, regular rhythm, normal S1, S2, no murmurs, rubs, clicks or gallops   Abdomen - obese, softly distended, no rebound or guarding, scar in epigastrium   Extremities - peripheral pulses normal, no pedal edema, no clubbing or cyanosis  Musculoskeletal: Normal range of motion all extremities  Mental status - normal mood, behavior, speech, dress, motor activity, and thought processes     Labs:      Recent CBC   Recent Labs   [facility] 06/01/13 0135    RBC 5.04    HGB 14.5    HCT 41.9*    MCV 83.1    MCH 28.8    MCHC 34.6     RDW 14    MPV 9.3*    LABPLAT --     Recent CMP   Recent Labs   [facility] 06/01/13 0715 06/01/13 0135    GLU -- 108*    BUN -- 14.7    CREAT -- 1.1    NA -- 140    CK 239* --    CO2 -- 23    CA -- 9.5    ALB -- 3.8    AST -- 18    ALT -- 18    GLOB -- 3.1         Radiology:      Chest 2 Views    05/31/2013  CLINICAL INFORMATION: 43 year old. Left-sided chest pressure. History of smoking.  TECHNIQUE AND FINDINGS: Chest: PA and lateral. No comparisons.  Lung volumes within normal limits. No pneumonia, atelectasis, or mass. Bronchial and parenchymal markings minimally increased consistent with smoking history. No evidence of interstitial fibrosis. No demonstrable bulla. No pleural, hilar, or mediastinal abnormality. Normal heart size. No indication of CHF. No significant abnormality of the aorta. No destructive bone lesions.      05/31/2013   Mild increase in bronchial and pulmonary markings consistent with smoking history. No plain film evidence of nodule or mass. No acute findings.  Annabell Sabal, MD  05/31/2013 7:45 PM         Assessment:       1. Chest pain likely GERD   2. GERD  3. HTN  4. Smoking Use   5. Increased LDL      Plan:     Admit and discharge same day  [name]/w Dr. Alanson Aly from cardiology  Continue home medication   Monitor with diet and possible medication   Patient ruled out MI, troponins WNL and EKG NSR  Continue prilosec for GERD  DVT px   Advised smoking cessation     Discharge planning and outpatient stress test per Dr. Alanson Aly.        Jackquline Bosch MD FACP  [facility] Hospitalist   Pager Number: [phone]

## 2013-06-01 NOTE — Progress Notes (Signed)
Verified with MD, pt admitting diagnosis. Md states pt not a stroke. Pt admitting diagnosis is chest pain

## 2013-06-01 NOTE — Discharge Instructions (Signed)
Chest Pain, Uncertain Cause  Chest pain can happen for a number of reasons. Sometimes the cause can not be determined. If yourcondition does not seem serious, and your pain does not appear to be coming from your heart, your doctor may recommend watching it closely. Sometimes the signs of a serious problem take more time to appear. Therefore, watch for the warning signs listed below.  Home care  After your visit, follow these recommendations:   Rest today and avoid strenuous activity.   Take any prescribed medicine as directed.  Follow-up care  Follow up with your doctor or this facility as instructed or if you do not start to feel better within 24 hours.  Call 911  Get immediate medical attention if any of the following occur:   A change in the type of pain: if it feels different, becomes more severe, lasts longer, or begins to spread into your shoulder, arm, neck, jaw or back   Shortness of breath or increased pain with breathing   Weakness, dizziness, or fainting   Rapid heart beat  Get prompt medical ttention  Call your doctor right away if any of the following occur:   Cough with dark colored sputum (phlegm) or blood   Fever of 100.4F(38C) or higher, or as directed by your health care provider   Swelling, pain or redness in one leg   2000-2014 [employer], [address], [place] [address]. All rights reserved. This information is not intended as a substitute for professional medical care. Always follow your healthcare professional's instructions.

## 2013-06-01 NOTE — Consults (Signed)
[facility] CONSULTATION REPORT  [facility]    Date Time: 06/01/2013 9:49 AM  Patient Name: Angel [name]  Requesting Physician: Jackquline Bosch, MD     Reason for Consultation:   CP  History:   Angel [name] is a 43 y.o. male admitted on 05/31/2013, for whom I am asked to provide cardiac consultation by Jackquline Bosch, MD.   Patient with past medical history of hypertension, history of TIA,  presented with left-sided chest discomfort that started on Thursday.   States it started in the evening and he describes it as "gas bubble."  It  went away after a few minutes.  Friday, he had a big lunch and afterwards  was walking briskly and noticed left-sided pain, along with a dull ache  that resolved with rest.  Again, the pain is described as discomfort and  like a gas bubble.  He came to the hospital and since admission has been  ruled out for myocardial infarction.  EKG shows normal sinus rhythm,  otherwise unremarkable.  No recurrent chest pain since in the hospital.  Assessment:     Patient Active Problem List   Diagnosis   . Chiari malformation type I   . Hypertension   . Transient ischemic attack (TIA)   . Elevated CPK   . Acute kidney injury   . Chest pain     Recommendations:   Patient with history of hypertension and possible previous TIA here with  chest pain with mostly atypical features, though he did get some jaw pain  as well.  Normally able to walk and run more than a mile and exercise on a  treadmill for 30 minutes without any problems.  Out pt treadmill stress  test.  Continue with risk factor modification. Daily ASA.  Physical Exam:   Patient Vitals for the past 12 hrs:   BP Temp Pulse Resp   06/01/13 0948 126/69 mmHg 97.9 F (36.6 C) 61  16    06/01/13 0730 - 32.5 F (0.3 C) - -   06/01/13 0453 113/74 mmHg 97.1 F (36.2 C) 60  19    06/01/13 0134 141/76 mmHg 98.1 F (36.7 C) 63  17    05/31/13 2213 142/84 mmHg 97.9 F (36.6 C) 55  17     121.428 kg (267 lb 11.2 oz)  2.159 m (7\' 1" )   Temp (24hrs), Avg:87 F (30.6 C), Min:32.5 F (0.3 C), Max:98.2 F (36.8 C)    Intake and Output Summary (Last 24 hours) at Date Time    Intake/Output Summary (Last 24 hours) at 06/01/13 0949  Last data filed at 06/01/13 0400   Gross per 24 hour   Intake    552 ml   Output    400 ml   Net    152 ml       GENERAL: Patient is in no acute distress   HEENT: No scleral icterus or conjunctival pallor, moist mucous membranes   NECK: No jugular venous distention or thyromegaly, normal carotid upstrokes without bruits   CARDIAC: S1 & S2 regular, no murmurs or gallops   CHEST: Clear to auscultation bilaterally, normal respiratory effort  ABDOMEN:  Nontender, non-distended, good bowel sounds, no masses, or hepatosplenomegaly, No abdominal bruits  EXTREMITIES: No clubbing, cyanosis, edema   SKIN: No rash or jaundice   NEUROLOGIC: Alert and oriented to time, place and person, normal mood and affect MUSCULOSKELETAL: Normal muscle strength and tone.    Past Medical History:  Past Medical History   Diagnosis Date   . Cluster headache    . Hypertensive disorder    . Acid reflux    . Meningitis    . Arnold-Chiari malformation, type I      Past Surgical History:     Past Surgical History   Procedure Date   . Sinus surgery    . Foot surgery    . Acl repair    . Vasectomy      Family History:   No family history on file.  Social History:     History     Social History   . Marital Status: Married     Spouse Name: N/A     Number of Children: N/A   . Years of Education: N/A     Social History Main Topics   . Smoking status: Former Smoker -- 20 years   . Smokeless tobacco: Not on file      Comment: 2-3 cigarettes/day   . Alcohol Use: Yes      Comment: social   . Drug Use: No   . Sexually Active:      Other Topics Concern   . Not on file     Social History Narrative   . No narrative on file     Allergies:   No Known Allergies  Medications:     Prescriptions prior to admission   Medication Sig   . aspirin EC 81 MG EC  tablet Take 81 mg by mouth daily.   . BUPROPION HCL PO Take 1 tablet by mouth 2 (two) times daily.    [name] gabapentin (NEURONTIN) 100 MG capsule Take 100 mg by mouth 3 (three) times daily.   [name] ibuprofen (ADVIL,MOTRIN) 200 MG tablet Take 200 mg by mouth every 6 (six) hours as needed.   [name] lisinopril (PRINIVIL,ZESTRIL) 10 MG tablet Take 10 mg by mouth daily.   [name] omeprazole (PRILOSEC) 40 MG capsule Take 40 mg by mouth daily.      Review of Systems:   Comprehensive review of systems including constitutional, eyes, ears, nose, mouth, throat, cardiovascular, GI, GU, musculoskeletal, integumentary, respiratory, neurologic, psychiatric, and endocrine is negative other than what is mentioned already in the history of present illness.    Labs Reviewed:   Recent CMP   Recent Labs   [facility] 06/01/13 0135    GLU 108*    BUN 14.7    CREAT 1.1    NA 140    K 4.5    CO2 23    CA 9.5    ALB 3.8    AST 18    ALT 18    GLOB 3.1     Recent CBC WITH DIFF Recent Labs   [facility] 06/01/13 0135    RBC 5.04    HGB 14.5    HCT 41.9*    MCV 83.1    MCHC 34.6    RDW 14    MPV 9.3*    PLT 161     Radiology:   Radiological Procedure reviewed.    chest X-ray      Signed by: Clinton Dragone August Luz, RPVI  06/01/2013, 9:49 AM  [facility]  [phone]

## 2013-06-11 NOTE — Discharge Summary (Signed)
Addendum:    Patient admit and discharge same day. Please see H&P for further details.     Allene Pyo Nina Mondor MD.

## 2013-07-17 LAB — ECG 12-LEAD
Atrial Rate: 81 {beats}/min
P Axis: 35 degrees
P-R Interval: 168 ms
Q-T Interval: 376 ms
QRS Duration: 86 ms
QTC Calculation (Bezet): 436 ms
R Axis: -2 degrees
T Axis: 32 degrees
Ventricular Rate: 81 {beats}/min

## 2013-07-18 LAB — ECG 12-LEAD
Atrial Rate: 60 {beats}/min
P Axis: 29 degrees
P-R Interval: 172 ms
Q-T Interval: 404 ms
QRS Duration: 86 ms
QTC Calculation (Bezet): 404 ms
R Axis: -1 degrees
T Axis: 10 degrees
Ventricular Rate: 60 {beats}/min

## 2014-05-02 DIAGNOSIS — B351 Tinea unguium: Secondary | ICD-10-CM | POA: Insufficient documentation

## 2015-02-23 ENCOUNTER — Emergency Department
Admission: EM | Admit: 2015-02-23 | Discharge: 2015-02-23 | Disposition: A | Discharge status: Home / Self Care | Payer: Enrolled Prime—HMO | Attending: Emergency Medicine | Admitting: Emergency Medicine

## 2015-02-23 ENCOUNTER — Emergency Department: Payer: Enrolled Prime—HMO

## 2015-02-23 DIAGNOSIS — K219 Gastro-esophageal reflux disease without esophagitis: Secondary | ICD-10-CM | POA: Insufficient documentation

## 2015-02-23 DIAGNOSIS — Z7982 Long term (current) use of aspirin: Secondary | ICD-10-CM | POA: Insufficient documentation

## 2015-02-23 DIAGNOSIS — I10 Essential (primary) hypertension: Secondary | ICD-10-CM | POA: Insufficient documentation

## 2015-02-23 DIAGNOSIS — R079 Chest pain, unspecified: Secondary | ICD-10-CM | POA: Insufficient documentation

## 2015-02-23 DIAGNOSIS — F17210 Nicotine dependence, cigarettes, uncomplicated: Secondary | ICD-10-CM | POA: Insufficient documentation

## 2015-02-23 LAB — CBC AND DIFFERENTIAL
Basophils Absolute Automated: 0.04 10*3/uL (ref 0.00–0.20)
Basophils Automated: 0 %
Eosinophils Absolute Automated: 0.29 10*3/uL (ref 0.00–0.70)
Eosinophils Automated: 4 %
Hematocrit: 42.2 % (ref 42.0–52.0)
Hgb: 14.7 g/dL (ref 13.0–17.0)
Immature Granulocytes Absolute: 0.02 10*3/uL
Immature Granulocytes: 0 %
Lymphocytes Absolute Automated: 2.89 10*3/uL (ref 0.50–4.40)
Lymphocytes Automated: 39 %
MCH: 29.6 pg (ref 28.0–32.0)
MCHC: 34.8 g/dL (ref 32.0–36.0)
MCV: 85.1 fL (ref 80.0–100.0)
MPV: 9.5 fL (ref 9.4–12.3)
Monocytes Absolute Automated: 0.54 10*3/uL (ref 0.00–1.20)
Monocytes: 7 %
Neutrophils Absolute: 3.7 10*3/uL (ref 1.80–8.10)
Neutrophils: 50 %
Platelets: 169 10*3/uL (ref 140–400)
RBC: 4.96 10*6/uL (ref 4.70–6.00)
RDW: 14 % (ref 12–15)
WBC: 7.46 10*3/uL (ref 3.50–10.80)

## 2015-02-23 LAB — COMPREHENSIVE METABOLIC PANEL
ALT: 21 U/L (ref 0–55)
AST (SGOT): 21 U/L (ref 5–34)
Albumin/Globulin Ratio: 1.3 (ref 0.9–2.2)
Albumin: 3.9 g/dL (ref 3.5–5.0)
Alkaline Phosphatase: 79 U/L (ref 38–106)
Anion Gap: 8 (ref 5.0–15.0)
BUN: 16.9 mg/dL (ref 9.0–28.0)
Bilirubin, Total: 0.4 mg/dL (ref 0.2–1.2)
CO2: 24 mEq/L (ref 22–29)
Calcium: 9 mg/dL (ref 8.5–10.5)
Chloride: 108 mEq/L (ref 100–111)
Creatinine: 1.3 mg/dL (ref 0.7–1.3)
Globulin: 3.1 g/dL (ref 2.0–3.6)
Glucose: 90 mg/dL (ref 70–100)
Potassium: 4.2 mEq/L (ref 3.5–5.1)
Protein, Total: 7 g/dL (ref 6.0–8.3)
Sodium: 140 mEq/L (ref 136–145)

## 2015-02-23 LAB — GFR: EGFR: >60

## 2015-02-23 LAB — TROPONIN I: Troponin I: <0.01 ng/mL (ref 0.00–0.09)

## 2015-02-23 LAB — LIPASE: Lipase: 41 U/L (ref 8–78)

## 2015-02-23 NOTE — Discharge Instructions (Signed)
Chest Pain of Unclear Etiology     You have been seen for chest pain. The cause of your pain is not yet known.     Your doctor has learned about your medical history, examined you, and checked any tests that were done. Still, it is unclear why you are having pain. The doctor thinks there is only a very small chance that your pain is caused by a life-threatening condition. Later, your primary care doctor might do more tests or check you again.     Sometimes chest pain is caused by a dangerous condition, like a heart attack, aorta injury, blood clot in the lung, or collapsed lung. It is unlikely that your pain is caused by a life-threatening condition if: Your chest pain lasts only a few seconds at a time; you are not short of breath, nauseated (sick to your stomach), sweaty, or lightheaded; your pain gets worse when you twist or bend; your pain improves with exercise or hard work.     Chest pain is serious. It is VERY IMPORTANT that you follow up with your regular doctor and seek medical attention immediately here or at the nearest Emergency Department if your symptoms become worse or they change.     YOU SHOULD SEEK MEDICAL ATTENTION IMMEDIATELY, EITHER HERE OR AT THE NEAREST EMERGENCY DEPARTMENT, IF ANY OF THE FOLLOWING OCCURS:  · Your pain gets worse.  · Your pain makes you short of breath, nauseated, or sweaty.  · Your pain gets worse when you walk, go up stairs, or exert yourself.  · You feel weak, lightheaded, or faint.  · It hurts to breathe.  · Your leg swells.  · Your symptoms get worse or you have new symptoms or concerns.

## 2015-02-23 NOTE — Special Discharge Instructions (Signed)
It is very unlikely that this is coming from your heart. Please return to the ER for severe chest pain, shortness of breath, or any other concerns.

## 2015-02-23 NOTE — ED Notes (Signed)
Pt c/o chest discomfort x 2 weeks. Pt with recent increase exercise, did crossfit workout recently and noted pain to left shoulder blade. Pt states "I did increased activity today to see if it caused the pain, but it didn't". Pt states "I think this is just a lot of stress and anxiety and just wanted to get it checked out". Pt denies any pain at this time.

## 2015-02-24 LAB — ECG 12-LEAD
Atrial Rate: 69 {beats}/min
P Axis: 36 degrees
P-R Interval: 160 ms
Q-T Interval: 384 ms
QRS Duration: 90 ms
QTC Calculation (Bezet): 411 ms
R Axis: 14 degrees
T Axis: 29 degrees
Ventricular Rate: 69 {beats}/min

## 2015-02-26 NOTE — ED Provider Notes (Signed)
No Known Allergies     Physician/Midlevel provider first contact with patient: 02/23/15 1613         Chief Complaint: Chest pain      Location: left   Quality: Sharp  Severity: Mild  Onset: Gradual  Duration: 3 Days Prior to Arrival  Modifying Factor: Worse with movement/exertion    Context:    Angel [name] is a 44 y.o. male with a past medical history significant for htn who presents to the emergency department for  Chest pain in left chest, worse with movement, recently did cross fit work out and since has had gradual onset of left chest wall pain, worse with arm movement. Had normal stress test and previous admission in 2015 for similar. On review of systems is associated with no other symptoms and patient denies sob, palpitations, light headedness, diaphoresis.     PMHx:     Past Medical History   Diagnosis Date   . Cluster headache    . Hypertensive disorder    . Acid reflux    . Meningitis    . Arnold-Chiari malformation, type I    . PTSD (post-traumatic stress disorder)         PSHx:      has past surgical history that includes Sinus surgery; Foot surgery; ACL REPAIR; and Vasectomy.     Family History:   No family history on file.     Social Hx:     Social History     Social History   . Marital Status: Married     Spouse Name: N/A   . Number of Children: N/A   . Years of Education: N/A     Social History Main Topics   . Smoking status: Light Tobacco Smoker -- 20 years   . Smokeless tobacco: None      Comment: 2-3 cigarettes/day   . Alcohol Use: Yes      Comment: social   . Drug Use: No   . Sexual Activity: Not Asked     Other Topics Concern   . None     Social History Narrative       ROS:   All other systems reviewed and are negative    No notes on file    Physical Exam:     Filed Vitals:    02/23/15 1659   BP: 114/73   Pulse: 62   Temp:    Resp: 16   SpO2: 99%      General: NAD, no respiratory distress  Head: atraumatic   Neck: no jvd, no carotid bruit, trachea midline   HENT: PERRL, EOMI, MMM  Pulmonary:  CTAB  CV: non tender to palpation, RRR, no mrg     GI: NTND, no rebound, no guarding  MS: no pitting edema, calves symmetric, no erythema   Neuro: No aphasia, moves all four extremities, no facial droop  Skin: no rashes   Psych: Normal affect     Results for orders placed or performed during the hospital encounter of 02/23/15   CBC with differential   Result Value Ref Range    WBC 7.46 3.50 - 10.80 x10 3/uL    Hgb 14.7 13.0 - 17.0 g/dL    Hematocrit 69.6 29.5 - 52.0 %    Platelets 169 140 - 400 x10 3/uL    RBC 4.96 4.70 - 6.00 x10 6/uL    MCV 85.1 80.0 - 100.0 fL    MCH 29.6 28.0 - 32.0 pg  MCHC 34.8 32.0 - 36.0 g/dL    RDW 14 12 - 15 %    MPV 9.5 9.4 - 12.3 fL    Neutrophils 50 None %    Lymphocytes Automated 39 None %    Monocytes 7 None %    Eosinophils Automated 4 None %    Basophils Automated 0 None %    Immature Granulocyte 0 None %    Neutrophils Absolute 3.70 1.80 - 8.10 x10 3/uL    Abs Lymph Automated 2.89 0.50 - 4.40 x10 3/uL    Abs Mono Automated 0.54 0.00 - 1.20 x10 3/uL    Abs Eos Automated 0.29 0.00 - 0.70 x10 3/uL    Absolute Baso Automated 0.04 0.00 - 0.20 x10 3/uL    Absolute Immature Granulocyte 0.02 0 x10 3/uL   Troponin I   Result Value Ref Range    Troponin I <0.01 0.00 - 0.09 ng/mL   Comprehensive metabolic panel   Result Value Ref Range    Glucose 90 70 - 100 mg/dL    BUN 16.1 9.0 - 09.6 mg/dL    Creatinine 1.3 0.7 - 1.3 mg/dL    Sodium 045 409 - 811 mEq/L    Potassium 4.2 3.5 - 5.1 mEq/L    Chloride 108 100 - 111 mEq/L    CO2 24 22 - 29 mEq/L    Calcium 9.0 8.5 - 10.5 mg/dL    Protein, Total 7.0 6.0 - 8.3 g/dL    Albumin 3.9 3.5 - 5.0 g/dL    AST (SGOT) 21 5 - 34 U/L    ALT 21 0 - 55 U/L    Alkaline Phosphatase 79 38 - 106 U/L    Bilirubin, Total 0.4 0.2 - 1.2 mg/dL    Globulin 3.1 2.0 - 3.6 g/dL    Albumin/Globulin Ratio 1.3 0.9 - 2.2    Anion Gap 8.0 5.0 - 15.0   Lipase   Result Value Ref Range    Lipase 41 8 - 78 U/L   GFR   Result Value Ref Range    EGFR >60.0    ECG 12 Lead   Result  Value Ref Range    Ventricular Rate 69 BPM    Atrial Rate 69 BPM    P-R Interval 160 ms    QRS Duration 90 ms    Q-T Interval 384 ms    QTC Calculation (Bezet) 411 ms    P Axis 36 degrees    R Axis 14 degrees    T Axis 29 degrees      No results found.     MDM:    I personally reviewed vital signs and pulse oximetry:   Oxygen saturation by pulse oximetry is 95%-100%, Normal.  Interventions: None Needed.     I personally reviewed Nursing notes    I personally reviewed previous/outside records: Similar previous visit    I Independently reviewed and interpreted Imaging: No infiltrate, no mediastinal widening    I independently reviewed and interpreted the EKG:   Rhythm: Normal Sinus Rhythm  Rate: 69  ST Segments: No Significant ST changes or t wave abnormality    Differential Diagnosis includes but is not limited to: ACS, PE, dissection, pericarditis, pneumothorax, pneumonia, musculoskeletal, Gastritis, GERD, pancreatitis    Heart Score:   Heart Score for Chest Pain Patients Score   History Highly Suspicious 2 points 0    Moderately Suspicious 1 point     Slightly or Non-Suspicious 0 point  ECG Significant ST Depression 2 points 0    Nonspecific Repolarization 1 point     Normal 0 point    Age Greater than or equal to 65 years 2 points 0    Greater than 45 and less than 65 years 1 point     Less than or equal to 45 years 0 point    Risk Factors Greater than 3 risk factors or History of CAD 2 points 1    1 or 2 Risk Factors 1 point     No Risk Factors 0 point    Troponin Greater than or equal to 3 times normal limit 2 points 0    Greater than 1 and less than 3 times normal limit 1 point     Less than or equal to normal limit 0 point    Risk Factors: DM, current or recent (less than one month) smoker, HTN, HIP, family history of CAD, and obesity  Recommendations:   Score 0-3: 2.5 % MACE over next 6 weeks --> discharge  Score 4-6; 20.3 % MACE over next 6 weeks --> observation  Score 7-10: 72.7 % MACE over next 6 weeks  ---> admit for early invasive strategies   TOTAL SCORE 1     Wells:     Clinical Signs and symptoms of DVT (3 pt)   PE is # 1 diagnosis, or equally likely (3 pt)  Heart rate > 100 bpm (1.5 pt)  Immobilization for > 3 days or surgery < 4 wk (1.5 pt)  Previous, objectively diagnosed PE or DVT (1.5 pt)  Hemoptysis (1 pt)  Malignancy with treatment in past 6 months (1 pt)    Total: 0    Risk of PE at 3 months:  < 2: 1.3 %  2-6: 16.2 %  > 6: 40.6 %  </= 4 and negative D-dimer: 0.5 % non-fatal VTE    PERC:    Age < 50   Heart rate < 100   Room O2 >/= 95 %  No prior DVT or PE  No recent trauma or surgery (4 wk)  No hemoptysis  No exogenous estrogen  No clinical signs suggestive of DVT    Total: 0    1% risk of VTE at 45 days if all 8 negative      MDM: highly doubt acs based on history, ekg, and one would expect an elevated troponin for if at this point in the clinical course.    I do not think this patient has a pulmonary embolism based on PERC rule.    No infiltrate or pneumothorax on CXR to suggest these as the cause of chest pain.    I do not think this is an aortic dissection, because the history is not suggestive of such, the patient does not appear to have Marfan syndrome, and denies cocaine or similar sympathomimetic drug use. Likewise, the patient has symmetric radial pulses, no focal neurologic deficits, no mediastinal widening on cxr. Given that the prevalence of aortic dissection in the population is 3.5 per 100,000, I think the negative predictive value in this patient is very high and no imaging is warranted.    Clinical Impression: chest pain    Disposition: Discharge home      Derrill Kay, MD  02/26/15 [phone]

## 2015-06-10 ENCOUNTER — Encounter (INDEPENDENT_AMBULATORY_CARE_PROVIDER_SITE_OTHER): Payer: Self-pay

## 2015-06-10 ENCOUNTER — Ambulatory Visit (INDEPENDENT_AMBULATORY_CARE_PROVIDER_SITE_OTHER): Payer: Enrolled Prime—HMO | Admitting: Family

## 2015-06-10 VITALS — BP 136/90 | HR 76 | Temp 98.7°F | Resp 16

## 2015-06-10 DIAGNOSIS — N390 Urinary tract infection, site not specified: Secondary | ICD-10-CM

## 2015-06-10 DIAGNOSIS — R350 Frequency of micturition: Secondary | ICD-10-CM

## 2015-06-10 LAB — POCT URINALYSIS AUTOMATED (IAH)
Bilirubin, UA POCT: NEGATIVE
Blood, UA POCT: NEGATIVE
Glucose, UA POCT: 100 — AB
Ketones, UA POCT: NEGATIVE mg/dL
Nitrite, UA POCT: POSITIVE — AB
PH, UA POCT: 6 (ref 4.6–8)
Protein, UA POCT: 30 mg/dL — AB
Specific Gravity, UA POCT: 1.01 mg/dL (ref 1.001–1.035)
Urine Leukocytes POCT: NEGATIVE
Urobilinogen, UA POCT: 1 mg/dL

## 2015-06-10 MED ORDER — CIPROFLOXACIN HCL 500 MG PO TABS
500.0000 mg | ORAL_TABLET | Freq: Two times a day (BID) | ORAL | Status: AC
Start: 2015-06-10 — End: 2015-06-17

## 2015-06-10 NOTE — Patient Instructions (Signed)
Urinary Tract Infections in Men    Urinary tract infections (UTIs) are most often caused by bacteria (germs) that invade the urinary tract. The bacteria may come from outside the body. Or they may travel from the skin outside ofrectum into the urethra. Pain in or around the urinary tract is a common symptom for most UTIs. But the only way to know for sure if you have a UTI is to have a urinalysis and urine culture.  Types of UTIs   Cystitis: This is abladder infection and is often linked to a blockage from an enlarged prostate. You may have an urgent or frequent need to urinate, and bloody urine. Treatment includes antibiotics and medications to relax or shrink the prostate. Sometimes, surgery is needed.   Urethritis: This is an infection of the urethra. You may have a discharge from the urethra or burning when you urinate. You may also have pain in the urethra or penis. It is treated with antibiotics.   Prostatitis: This is an inflammation or infection of the prostate. You may have an urgent or frequent need to urinate, fever, or burning when you urinate. Or you may have a tender prostate, or a vague feeling of pressure. Prostatitis is treated with a range of medications, depending on the cause.   Pyelonephritis: This is a kidney infection. If not treated, it can be serious and damage your kidneys. In severe cases you may be hospitalized. You may have a fever and upper back pain.  Treating a UTI   Medications: Most UTIs are treated with antibiotics. These kill the bacteria. The length of time you need to take them depends on the type of infection. Take antibiotics exactly as directed until all of the medication is gone. If you don't, the infection may not go away and may become harder to treat. For certain types of UTIs, you may be given other medications to help treat your symptoms.   Lifestyle changes: The lifestyle changes below will help get rid of your current infection. They may also help prevent  future UTIs.   Drink plenty of fluids such as water, juice, or other caffeine-free drinks. This helps flush bacteria out of your system.   Empty your bladder when you feel the urge to urinate and before going to sleep. Urine that stays in your bladder promotes infection.   Use condoms during sex. These help prevent UTIs caused by sexually transmitted bacteria.   Keep follow-up appointments with your health care provider. He or she can may do tests to make sure the infection has cleared. If necessary, additional treatment can be started.   Other treatment: Most UTIs respond to medication. But sometimes a procedure or surgery is needed. This can treat an enlarged prostate, or remove a kidney stone or other blockage. Surgery may also treat problems caused by scarring or long-term infections.  Date Last Reviewed: 12/10/2012   2000-2016 [employer] [address], [place] [address]. All rights reserved. This information is not intended as a substitute for professional medical care. Always follow your healthcare professional's instructions.

## 2015-06-10 NOTE — Progress Notes (Signed)
[facility]    PROGRESS NOTE      Patient: Angel [name]   Date: 06/10/2015   MRN: [id number]     Past Medical History   Diagnosis Date   . Cluster headache    . Hypertensive disorder    . Acid reflux    . Meningitis    . Arnold-Chiari malformation, type I    . PTSD (post-traumatic stress disorder)      Social History     Social History   . Marital Status: Married     Spouse Name: N/A   . Number of Children: N/A   . Years of Education: N/A     Occupational History   . Not on file.     Social History Main Topics   . Smoking status: Light Tobacco Smoker -- 20 years   . Smokeless tobacco: Not on file      Comment: 2-3 cigarettes/day   . Alcohol Use: Yes      Comment: social   . Drug Use: No   . Sexual Activity: Not on file     Other Topics Concern   . Not on file     Social History Narrative     History reviewed. No pertinent family history.    ASSESSMENT/PLAN     Angel [name] is a 45 y.o. male    Chief Complaint   Patient presents with   . Abdominal Pain        1. Urinary frequency  - POCT UA Clinitek AX (urine dipstick)  - Urine culture    2. Lower urinary tract infectious disease  - ciprofloxacin (CIPRO) 500 MG tablet; Take 1 tablet (500 mg total) by mouth 2 (two) times daily.  Dispense: 14 tablet; Refill: 0    Plan:  Increase fluid intake to 1-2 L per day. Hygiene & Post coital hygiene reviewed with patient.  Encourage daily vitamin C intake of 500-1000mg , cranberry tablets/pills if recurrent UTI for prevention, and adequate rest. Reviewed medication use and side effects with patient, please complete entire course of antibiotics. Also, recommend taking probiotics or eat yogurt while on antibiotic to decrease GI upset. Signs and symptoms reviewed with patient that warrants RTO. Patient agreeable to plan.       Results for orders placed or performed in visit on 06/10/15   POCT UA Clinitek AX (urine dipstick)   Result Value Ref Range    Color UA POCT Orange     Clarity UA POCT Clear     Glucose, UA  POCT =100 (A) Negative    Bilirubin, UA POCT Negative Negative    Ketones, UA POCT Negative Negative mg/dL    Specific Gravity, UA POCT 1.010 1.001 - 1.035 mg/dL    Blood, UA POCT  Negative Negative    PH, UA POCT 6.0 4.6 - 8    Protein, UA POCT =30 (A) Negative mg/dL    Urobilinogen, UA POCT 1.0 0.2, 1.0 mg/dL    Nitrite, UA POCT Positive (A) Negative    Leukocytes, UA POCT Negative Negative       Risk & Benefits of the new medication(s) were explained to the patient (and family) who verbalized understanding & agreed to the treatment plan. Patient (family) encouraged to contact me/clinical staff with any questions/concerns      MEDICATIONS     Current Outpatient Prescriptions   Medication Sig Dispense Refill   . aspirin EC 81 MG EC tablet Take 81 mg by mouth daily.     [name]  gabapentin (NEURONTIN) 100 MG capsule Take 100 mg by mouth 3 (three) times daily.     [name] ibuprofen (ADVIL,MOTRIN) 200 MG tablet Take 200 mg by mouth every 6 (six) hours as needed.     [name] lisinopril (PRINIVIL,ZESTRIL) 10 MG tablet Take 10 mg by mouth daily.     [name] omeprazole (PRILOSEC) 40 MG capsule Take 40 mg by mouth daily.     . BUPROPION HCL PO Take 1 tablet by mouth 2 (two) times daily.      . ciprofloxacin (CIPRO) 500 MG tablet Take 1 tablet (500 mg total) by mouth 2 (two) times daily. 14 tablet 0     No current facility-administered medications for this visit.       No Known Allergies    SUBJECTIVE     Chief Complaint   Patient presents with   . Abdominal Pain        Abdominal Pain  This is a new problem. Episode onset: "a few days ago" The onset quality is gradual. The pain is located in the suprapubic region. The pain is at a severity of 4/10. The quality of the pain is aching. Associated symptoms include frequency. Pertinent negatives include no diarrhea, dysuria, hematuria, nausea or vomiting. Treatments tried: Azo, started last night. The treatment provided no relief.     Reports that he has a constant urge to go to the restroom, he is  going every 15 min, and has an ache in lower abdominal area.     ROS     Review of Systems   Constitutional: Negative.    HENT: Negative.    Respiratory: Negative.    Cardiovascular: Negative.    Gastrointestinal: Positive for abdominal pain ( +"bladder fullness" ). Negative for nausea, vomiting and diarrhea.   Genitourinary: Positive for urgency and frequency. Negative for dysuria, hematuria, decreased urine volume, discharge, penile swelling, scrotal swelling, difficulty urinating, genital sores, penile pain and testicular pain.   Musculoskeletal: Negative.    Skin: Negative.    Neurological: Negative.    Hematological: Negative.    Psychiatric/Behavioral: Negative.        The following portions of the patient's history were reviewed and updated as appropriate: Allergies, Current Medications, Past Family History, Past Medical history, Past social history, Past surgical history, and Problem List.    PHYSICAL EXAM     Filed Vitals:    06/10/15 1133   BP: 136/90   Pulse: 76   Temp: 98.7 F (37.1 C)   TempSrc: Tympanic   Resp: 16   SpO2: 96%       Physical Exam   Constitutional: He is oriented to person, place, and time. He appears well-developed and well-nourished. No distress.   HENT:   Head: Normocephalic.   Cardiovascular: Normal rate, regular rhythm and normal heart sounds.    Pulmonary/Chest: Effort normal and breath sounds normal. No respiratory distress. He has no wheezes. He has no rales.   Abdominal: Soft. Normal appearance and bowel sounds are normal. There is no hepatosplenomegaly. There is tenderness ( +very mild) in the suprapubic area. There is no CVA tenderness.   Neurological: He is alert and oriented to person, place, and time.   Skin: Skin is warm and dry. No rash noted. He is not diaphoretic. No erythema. No pallor.   Psychiatric: He has a normal mood and affect. His behavior is normal. Judgment and thought content normal.     Ortho Exam  Neurologic Exam     Mental Status  Oriented to person,  place, and time.       PROCEDURE(S)     Procedures        Signed,  Leslie Andrea, FNP  06/10/2015   Supervising physician Dr. Boneta Lucks (not present on exam)

## 2015-06-13 LAB — URINE CULTURE

## 2015-11-18 ENCOUNTER — Emergency Department
Admission: EM | Admit: 2015-11-18 | Discharge: 2015-11-18 | Disposition: A | Discharge status: Home / Self Care | Payer: Enrolled Prime—HMO | Attending: Emergency Medicine | Admitting: Emergency Medicine

## 2015-11-18 ENCOUNTER — Emergency Department: Payer: Enrolled Prime—HMO

## 2015-11-18 DIAGNOSIS — F17210 Nicotine dependence, cigarettes, uncomplicated: Secondary | ICD-10-CM | POA: Insufficient documentation

## 2015-11-18 DIAGNOSIS — M79662 Pain in left lower leg: Secondary | ICD-10-CM | POA: Insufficient documentation

## 2015-11-18 DIAGNOSIS — I10 Essential (primary) hypertension: Secondary | ICD-10-CM | POA: Insufficient documentation

## 2015-11-18 DIAGNOSIS — Z7982 Long term (current) use of aspirin: Secondary | ICD-10-CM | POA: Insufficient documentation

## 2015-11-18 DIAGNOSIS — K219 Gastro-esophageal reflux disease without esophagitis: Secondary | ICD-10-CM | POA: Insufficient documentation

## 2015-11-18 DIAGNOSIS — M5416 Radiculopathy, lumbar region: Secondary | ICD-10-CM

## 2015-11-18 NOTE — ED Provider Notes (Signed)
Physician/Midlevel provider first contact with patient: 11/18/15 0222         EMERGENCY DEPARTMENT HISTORY AND PHYSICAL EXAM    Date Time: 11/18/2015 5:49 AM  Patient Name: Angel [name], Angel [name], 45 y.o., male  ED Provider: Louis Matte, M.D., FACEP    History of Presenting Illness:     Chief Complaint: Pain in his left calf  History obtained from: Patient.  Onset/Duration: Last 3 days  Quality: Throbbing  Severity: Mild  Aggravating Factors: Unclear  Alleviating Factors: Relief with 600 mg of ibuprofen  Associated Symptoms: No numbness, tingling, weakness, bowel or bladder dysfunction    Narrative/Additional Historical Findings:Angel [name] is a 45 y.o. male  here with intermittent pain of his left thigh that goes down to his leg occasionally pain radiates from his back down to the lateral aspect of his thigh.  Denies bowel or bladder dysfunction, focal weakness and any prior history of herniated disc or back issues    Nursing notes from this date of service were reviewed.    Past Medical History:     Past Medical History   Diagnosis Date   . Cluster headache    . Hypertensive disorder    . Acid reflux    . Meningitis    . Arnold-Chiari malformation, type I    . PTSD (post-traumatic stress disorder)        Past Surgical History:     Past Surgical History   Procedure Laterality Date   . Sinus surgery     . Foot surgery     . Acl repair     . Vasectomy         Family History:   History reviewed. No pertinent family history.    Social History:     Social History     Social History   . Marital Status: Married     Spouse Name: N/A   . Number of Children: N/A   . Years of Education: N/A     Social History Main Topics   . Smoking status: Light Tobacco Smoker -- 20 years   . Smokeless tobacco: Not on file      Comment: 2-3 cigarettes/day   . Alcohol Use: Yes      Comment: social   . Drug Use: No   . Sexual Activity: Not on file     Other Topics Concern   . Not on file     Social History Narrative       Allergies:   No Known  Allergies    Medications:   No current facility-administered medications for this encounter.    Current outpatient prescriptions:   .  aspirin EC 81 MG EC tablet, Take 81 mg by mouth daily., Disp: , Rfl:   .  BUPROPION HCL PO, Take 1 tablet by mouth 2 (two) times daily. , Disp: , Rfl:   .  gabapentin (NEURONTIN) 100 MG capsule, Take 100 mg by mouth 3 (three) times daily., Disp: , Rfl:   .  ibuprofen (ADVIL,MOTRIN) 200 MG tablet, Take 200 mg by mouth every 6 (six) hours as needed., Disp: , Rfl:   .  lisinopril (PRINIVIL,ZESTRIL) 10 MG tablet, Take 10 mg by mouth daily., Disp: , Rfl:   .  omeprazole (PRILOSEC) 40 MG capsule, Take 40 mg by mouth daily., Disp: , Rfl:     Review of Systems:     Review of Systems   Constitutional: Negative for fever and chills.   HENT: Negative for congestion,  ear pain and sore throat.    Eyes: Negative for pain and redness.   Respiratory: Negative for cough and shortness of breath.    Cardiovascular: Negative for chest pain and palpitations.   Gastrointestinal: Negative for nausea, vomiting and abdominal pain.   Genitourinary: Negative for dysuria and frequency.   Musculoskeletal: Positive for gait problem. Negative for myalgias, back pain, joint swelling and neck pain.   Skin: Negative for rash.   Neurological: Negative for dizziness and headaches.   Psychiatric/Behavioral: Negative for suicidal ideas and hallucinations.   All other systems reviewed and are negative.    Physical Exam:   ED Triage Vitals   Enc Vitals Group      BP 11/18/15 0014 132/85 mmHg      Heart Rate 11/18/15 0014 73      Resp Rate 11/18/15 0014 16      Temp 11/18/15 0014 98 F (36.7 C)      Temp Source 11/18/15 0014 Temporal Art      SpO2 11/18/15 0014 98 %      Weight 11/18/15 0014 117 kg      Height --       Head Cir --       Peak Flow --       Pain Score 11/18/15 0014 7      Pain Loc --       Pain Edu? --       Excl. in GC? --      Physical Exam   Constitutional: He is oriented to person, place, and time. He  appears well-developed and well-nourished.   HENT:   Head: Normocephalic and atraumatic.   Eyes: Pupils are equal, round, and reactive to light.   Neck: Normal range of motion. Neck supple.   Cardiovascular: Normal rate and regular rhythm.    Pulmonary/Chest: Effort normal and breath sounds normal.   Abdominal: Soft. Bowel sounds are normal.   Musculoskeletal: Normal range of motion.        Left hip: He exhibits normal strength, no tenderness, no swelling and no deformity.        Left lower leg: He exhibits tenderness. He exhibits no swelling, no edema and no deformity.   Left calf, no Homans, no cords, no erythema, no warmth and no swelling   Neurological: He is alert and oriented to person, place, and time.   Skin: Skin is warm and dry.   Psychiatric: He has a normal mood and affect. His behavior is normal.     Labs:   Labs Reviewed - No data to display      Rads:     Radiology Results (24 Hour)     Procedure Component Value Units Date/Time    [place] VenoDopp Low Extremity Left [[id number]] Collected:  11/18/15 0203    Order Status:  Completed Updated:  11/18/15 0208    Narrative:      History: Left calf pain.    Duplex evaluation of the deep venous system of the left lower extremity  was performed for evaluation of calf pain.   The common femoral vein,  femoral vein and popliteal vein were visualized and appear unremarkable.   These veins are compressible with no evidence of thrombus.  Doppler  demonstrates patency and normal directional and phasic flow.   Appropriate augmentation was observed.  The proximal deep femoral vein  is visualized and appears unremarkable.  The portions of the posterior  tibial and peroneal veins that were seen appear unremarkable  with no  thrombus noted.  There is no indirect evidence of calf vein thrombus.      Impression:       No evidence of deep venous thrombosis involving the left  lower extremity.            Olen Pel, MD   11/18/2015 2:04 AM            MDM and ED Course   Louis Matte, M.D., FACEP is the primary attending for this patient and has obtained and performed the history, PE, and medical decision making for this patient.    MDM:    DDX: Probable radicular pain from lumbar area, doubt deep vein thrombosis  Plan: Patient wants an ultrasound of his leg and artery feels better after ibuprofen at home    Patient Vitals for the past 24 hrs:   BP Temp Temp src Pulse Resp SpO2 Weight   11/18/15 0229 125/83 mmHg - - 66 18 99 % -   11/18/15 0014 132/85 mmHg 98 F (36.7 C) Temporal Art 73 16 98 % 117 kg     Negative ultrasound left lower extremity.  NSAIDS  and outpatient follow-up for MRI for ongoing back pain radiating to his leg    Procedures    Nursing Notes: Reviewed and utilized available nursing notes.  Medical Records Reviewed: Reviewed available past medical records.  Counseling: The emergency provider has spoken with the patient and discussed today?s findings, in addition to providing specific details for the plan of care.  Questions are answered and there is agreement with the plan.          Assessment/Plan:   Results and instructions reviewed at the bedside with patient and family.    Clinical Impression  Final diagnoses:   Left lumbar radiculopathy   Pain of left calf       Disposition  ED Disposition     Discharge Angel [name] discharge to home/self care.    Condition at disposition: Stable            Prescriptions  Discharge Medication List as of 11/18/2015  2:24 AM              Signed by: Oliver Barre, MD, FACEP  [facility]  [facility]    This note was generated by the [employer] EMR/[employer] speech recognition dictation system and may contain errors not intended by the user.  Random grammatical errors, pronoun errors and omissions may be a consequences of this technology due to software limitations.  Not all errors are caught or corrected.  If there are questions or concerns about the contents of this note, they should be addressed  directly to the author of the note for clarification.            Oliver Barre, MD  11/18/15 [phone]

## 2015-11-18 NOTE — Discharge Instructions (Signed)
Leg Pain    You have been diagnosed with leg pain.    Many things can cause leg pain. Most of the causes are not dangerous and will get better on their own. These can include muscle cramps, bruises, strains, pinched nerves, and minor skin infections.    You had an exam by your doctor today. He or she decided that the cause of your leg pain does not seem to be serious or dangerous.    If your pain continues, you might need another exam or more tests. This is to find out why you have leg pain. The cause of your symptoms doesn t seem dangerous now. You don t need to stay in the hospital.    Though we don't believe your condition is dangerous right now, it is important to be careful. Sometimes a problem that seems mild can become serious later. This is why it is very important that you return here or go to the nearest Emergency Department if you are not improving or your symptoms are getting worse.    Some things you may try at home are:    Over-the-counter pain medications like that have ibuprofen (Advil/Motrin) or acetaminophen (Tylenol) in them. Follow the directions on the package.   Rest the leg as needed. As soon as you are able, start moving your leg again to keep it from getting stiff.    Return here or go to the nearest Emergency Department or follow-up with your doctor if you are not getting better as expected.    Follow the instructions for any medication you are prescribed.     YOU SHOULD SEEK MEDICAL ATTENTION IMMEDIATELY, EITHER HERE OR AT THE NEAREST EMERGENCY DEPARTMENT, IF ANY OF THE FOLLOWING HAPPENS:   You have a fever (temperature higher than 100.27F or 38C).   Your pain does not go away or gets worse.   Your leg or joints (hip, knee, ankle, etc.) are red or swollen.   Your chest hurts or you get short of breath.   You have any other symptoms or concerns, or don t get better as expected.    If you can t follow up with your doctor, or if at any time you feel you need to be  rechecked or seen again, come back here or go to the nearest emergency department.            Lumbar Radiculopathy    You have been seen for a lumbar radiculopathy.     Your spine has bones called "vertebrae." In between the bones there are soft cushions. These are called "discs." The discs keep the vertebrae from rubbing against each other. Inside of each disc is a thick, jelly-like substance called the "nucleus pulposus." Sometimes when the spine is injured, a disc is damaged and the nucleus pulposus leaks out of the disc. This is called a "disc herniation." As people age, the discs get brittle and delicate. If this happens, you might have a disc herniation. This is possible even if you don t remember getting injured.    Sometimes a herniated disc presses on a nerve in the spine. This causes pain near the herniated disc. Since you have symptoms in your lower back and leg, the problem is in the lumbar (lower back) spine. Other problems can cause a radiculopathy (nerve pain), including a narrowed opening where the nerves come out.    Some symptoms of a lumbar radiculopathy are:     Pain down one of your legs.  A burning feeling down your leg.   Numbness (pins and needles or loss of feeling) in your leg.   In serious cases, your leg may feel weak.    This problem is often treated with rest, physical therapy, and medication for the swelling and pain. If these treatments don t work, surgery may be needed. Sometimes taking steroids (like Prednisone) for a few days can help the pain.    We don't believe your condition is dangerous right now. However, you need to be careful. Sometimes a problem that seems small can get serious later. Therefore, it is very important for you to come back here or go to the nearest Emergency Department if you don t get better or your symptoms get worse.     You have been referred to see an orthopedic surgeon. This does not necessarily mean that you will need surgery. This doctor  can look at your problem and make recommendations for more tests or treatment.    You may have been referred to get an MRI of your spine or an EMG. These tests look at your problem more closely.    Follow up with your doctor or the referral doctor as soon as possible.    YOU SHOULD SEEK MEDICAL ATTENTION IMMEDIATELY, EITHER HERE OR AT THE NEAREST EMERGENCY DEPARTMENT, IF ANY OF THE FOLLOWING OCCURS:     You lose bowel or bladder control (you soil or wet yourself).   You feel week or can t use your leg(s).   The medication doesn t help the pain.   You have a fever (temperature higher than 100.55F or 38C) or shaking chills.   You have serious pain over one bone (vertebra) in your back.    If you can t follow up with your doctor, or if at any time you feel you need to be rechecked or seen again, come back here or go to the nearest emergency department.

## 2015-11-30 ENCOUNTER — Other Ambulatory Visit (INDEPENDENT_AMBULATORY_CARE_PROVIDER_SITE_OTHER): Payer: Self-pay | Admitting: Internal Medicine

## 2015-11-30 MED ORDER — AZITHROMYCIN 500 MG PO TABS
1000.0000 mg | ORAL_TABLET | Freq: Once | ORAL | Status: AC
Start: 2015-11-30 — End: 2015-11-30

## 2016-04-27 ENCOUNTER — Emergency Department (HOSPITAL_COMMUNITY)
Admission: EM | Admit: 2016-04-27 | Discharge: 2016-04-27 | Disposition: A | Discharge status: Home / Self Care | Attending: Emergency Medicine | Admitting: Emergency Medicine

## 2016-04-27 ENCOUNTER — Encounter (HOSPITAL_COMMUNITY): Payer: Self-pay | Admitting: Emergency Medicine

## 2016-04-27 ENCOUNTER — Emergency Department (HOSPITAL_COMMUNITY)

## 2016-04-27 DIAGNOSIS — F17200 Nicotine dependence, unspecified, uncomplicated: Secondary | ICD-10-CM | POA: Insufficient documentation

## 2016-04-27 DIAGNOSIS — R079 Chest pain, unspecified: Secondary | ICD-10-CM | POA: Insufficient documentation

## 2016-04-27 LAB — I-STAT TROPONIN, ED: Troponin i, poc: 0 ng/mL (ref 0.00–0.08)

## 2016-04-27 LAB — CBC
HCT: 42.3 % (ref 39.0–52.0)
Hemoglobin: 14.8 g/dL (ref 13.0–17.0)
MCH: 29.2 pg (ref 26.0–34.0)
MCHC: 35 g/dL (ref 30.0–36.0)
MCV: 83.4 fL (ref 78.0–100.0)
PLATELETS: 187 10*3/uL (ref 150–400)
RBC: 5.07 MIL/uL (ref 4.22–5.81)
RDW: 13.7 % (ref 11.5–15.5)
WBC: 7.7 10*3/uL (ref 4.0–10.5)

## 2016-04-27 LAB — BASIC METABOLIC PANEL
Anion gap: 10 (ref 5–15)
BUN: 16 mg/dL (ref 6–20)
CHLORIDE: 107 mmol/L (ref 101–111)
CO2: 22 mmol/L (ref 22–32)
CREATININE: 1.22 mg/dL (ref 0.61–1.24)
Calcium: 9.5 mg/dL (ref 8.9–10.3)
GFR calc non Af Amer: >60 mL/min (ref >60)
Glucose, Bld: 99 mg/dL (ref 65–99)
Potassium: 4.3 mmol/L (ref 3.5–5.1)
SODIUM: 139 mmol/L (ref 135–145)

## 2016-04-27 IMAGING — DX DG CHEST 2V
2 series · 2 of 2 positions shown · non-contrast
Comparison: None in PACs

CLINICAL DATA: Chest pain radiating to the left shoulder yesterday.
Abnormal pulse rate during exercise. Current smoker.

EXAM:
CHEST  2 VIEW

[w chest pa]
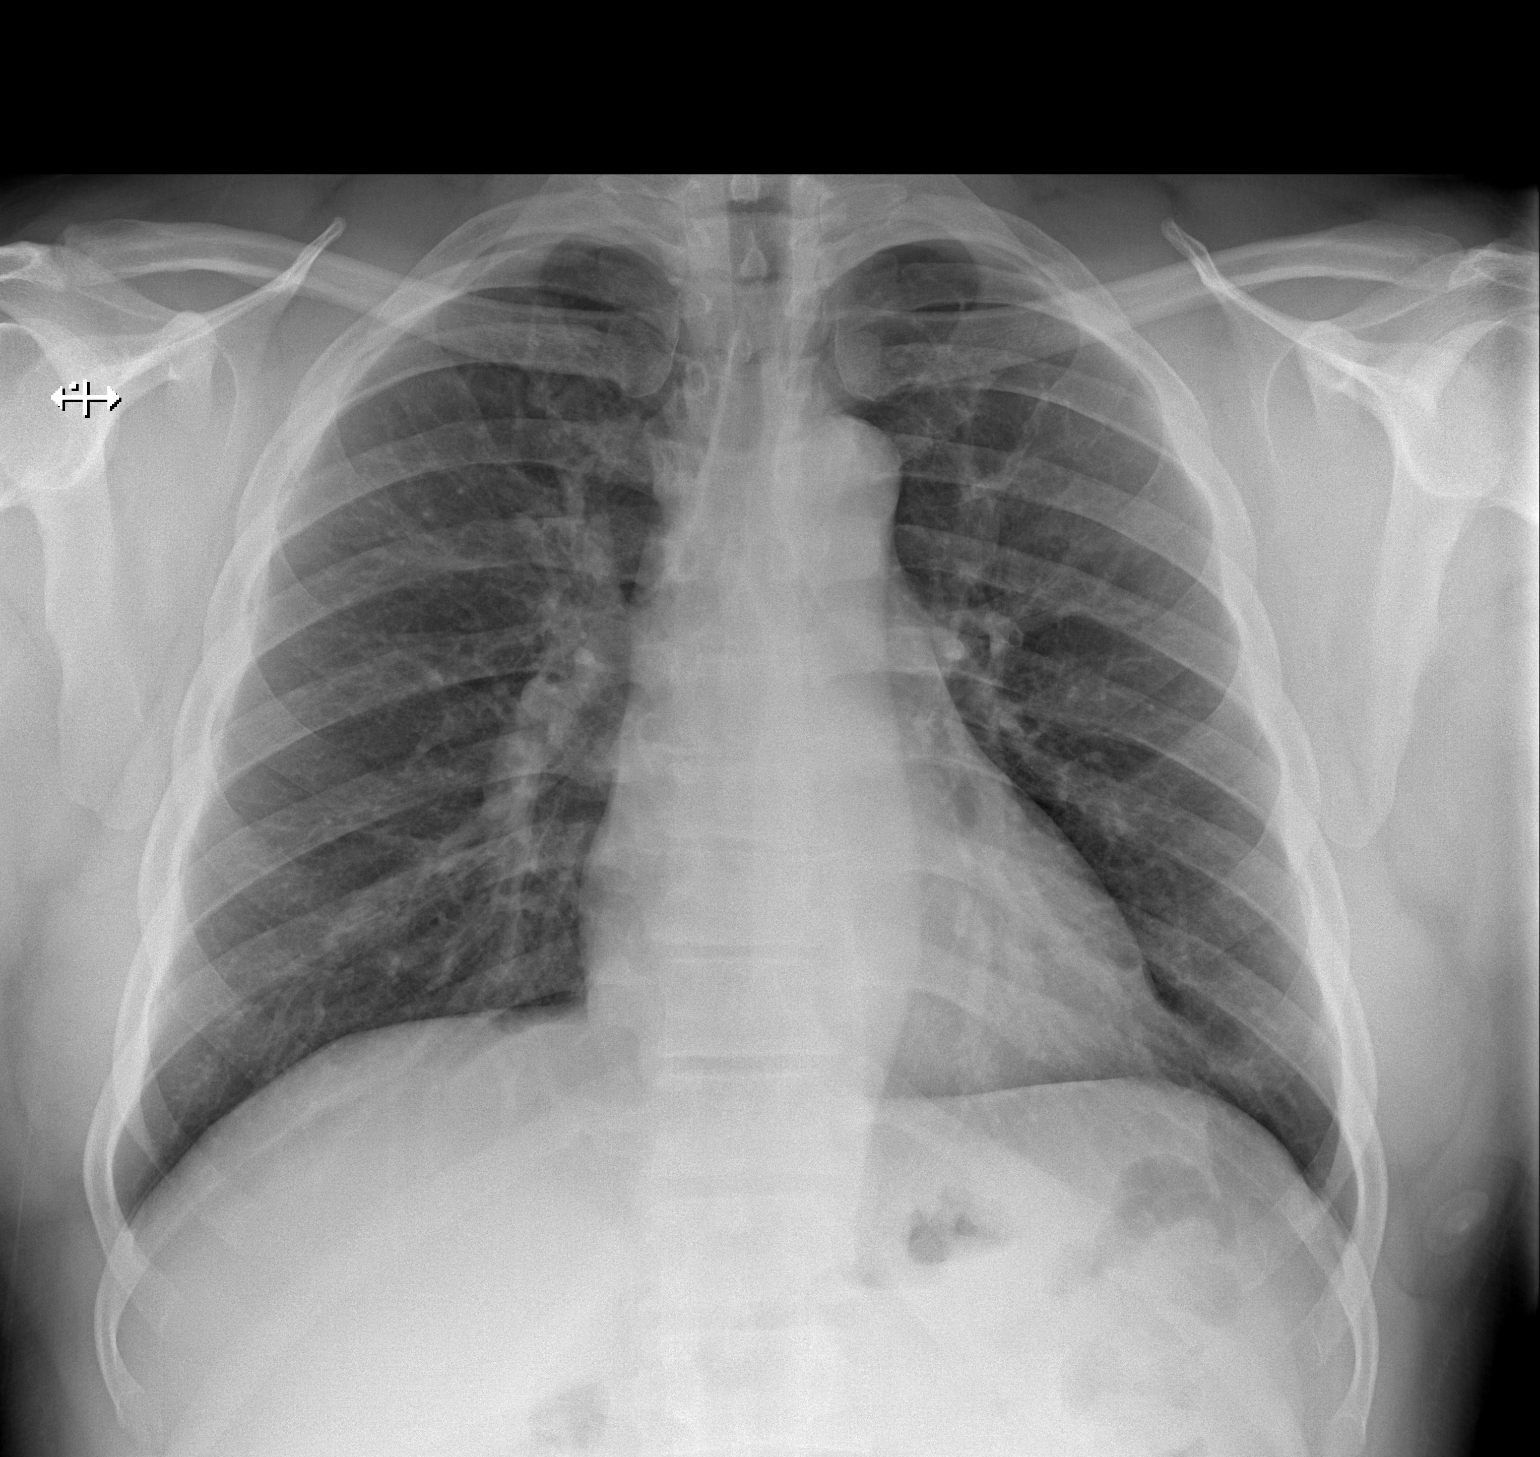

[w chest lat]
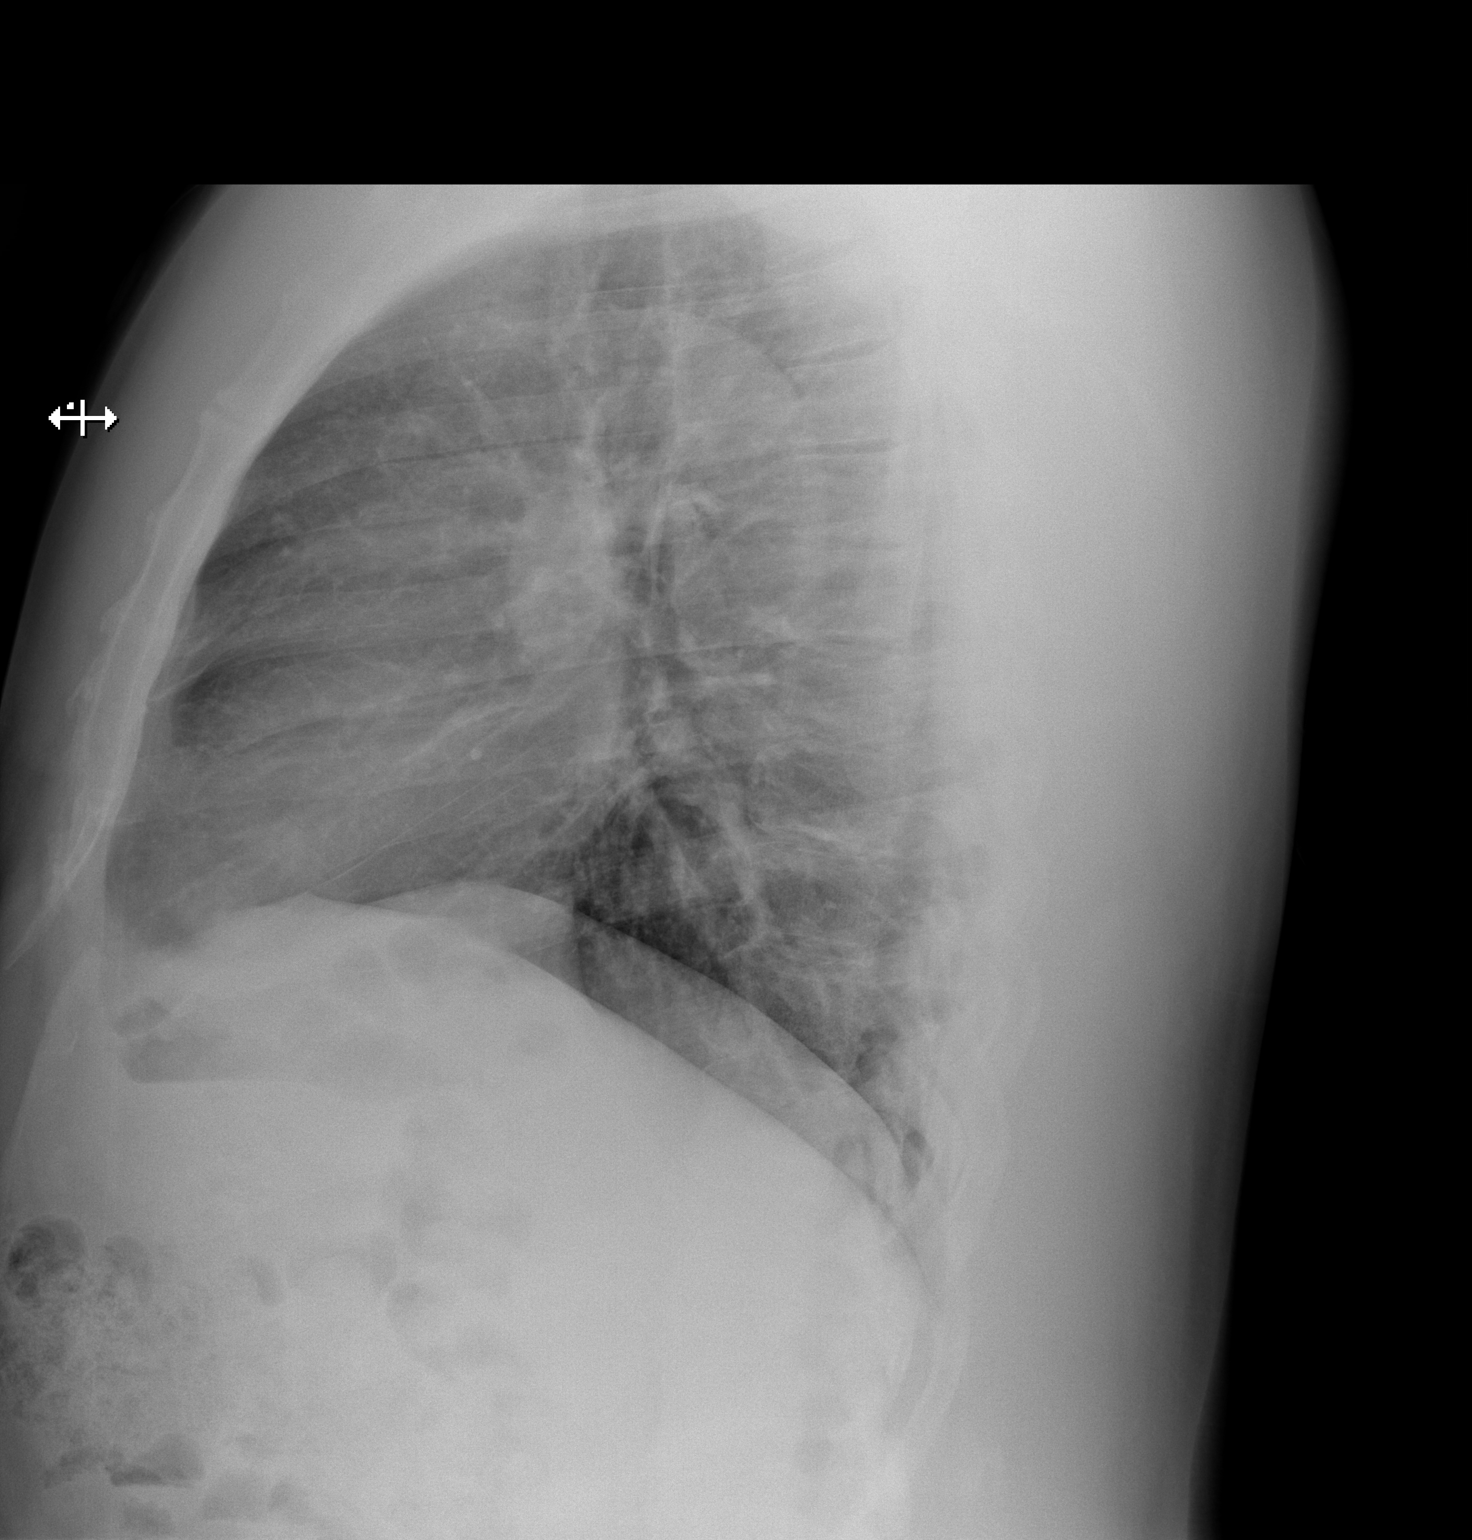

[2 of 2 positions shown; findings below may reference images not displayed]

FINDINGS: The lungs are adequately inflated. The interstitial markings are
coarse bilaterally. There is no alveolar infiltrate, pleural
effusion, or pneumothorax. The heart and pulmonary vascularity are
normal. The mediastinum is normal in width. The trachea is midline.
The bony thorax exhibits no acute abnormality.
IMPRESSION: Mild interstitial prominence bilaterally may be acute or chronic.
There is no alveolar pneumonia nor CHF.

## 2016-04-27 NOTE — ED Triage Notes (Signed)
Pt states "yesterday I was walking and had sudden onset of sharp chest pain across my chest and my right arm, today I was working out and my heart rate was going all over the place.". Denies CP at this moment.

## 2016-04-27 NOTE — Discharge Instructions (Signed)
Please read and follow all provided instructions.  Your diagnoses today include:  1. Nonspecific chest pain     Tests performed today include:  An EKG of your heart   A chest x-ray   Cardiac enzymes - a blood test for heart muscle damage  Blood counts and electrolytes  Vital signs. See below for your results today.   Medications prescribed:   None  Take any prescribed medications only as directed.  Follow-up instructions: Please follow-up with your primary care provider as soon as you can for further evaluation of your symptoms.   Return instructions:  SEEK IMMEDIATE MEDICAL ATTENTION IF:  You have severe chest pain, especially if the pain is crushing or pressure-like and spreads to the arms, back, neck, or jaw, or if you have sweating, nausea (feeling sick to your stomach), or shortness of breath. THIS IS AN EMERGENCY. Don't wait to see if the pain will go away. Get medical help at once. Call 911 or 0 (operator). DO NOT drive yourself to the hospital.   Your chest pain gets worse and does not go away with rest.   You have an attack of chest pain lasting longer than usual, despite rest and treatment with the medications your caregiver has prescribed.   You wake from sleep with chest pain or shortness of breath.  You feel dizzy or faint.  You have chest pain not typical of your usual pain for which you originally saw your caregiver.   You have any other emergent concerns regarding your health.  Additional Information: Chest pain comes from many different causes. Your caregiver has diagnosed you as having chest pain that is not specific for one problem, but does not require admission.  You are at low risk for an acute heart condition or other serious illness.   Your vital signs today were: BP 121/84    Pulse 66    Temp 98.3 F (36.8 C)    Resp 17    Ht 6' (1.829 m)    Wt 120.2 kg    SpO2 100%    BMI 35.94 kg/m  If your blood pressure (BP) was elevated above 135/85  this visit, please have this repeated by your doctor within one month. --------------

## 2016-04-27 NOTE — ED Provider Notes (Signed)
[facility] DEPT Provider Note   CSN: [id number] Arrival date & time: 04/27/16  [id number]     History   Chief Complaint Chief Complaint  Patient presents with  . Chest Pain    HPI Casey Park is a 46 y.o. male.  Patient with history of hypertension, smoking history presents with complaint of acute onset of fleeting right-sided chest pain yesterday. Symptoms occurred around 6 PM. Patient was walking downstairs when he had right sided chest pain with radiation to left chest which lasted 1-2 seconds and then completely resolved. He did not have associated shortness of breath, lightheadedness, dizziness, diaphoresis. Patient exercises regularly including this morning. Patient stopped his exercise early today because he noted that the heart rate on his treadmill and apple watch suddenly dropped to 70. He was asymptomatic during this event. No chest pain, lightheadedness, or shortness of breath. This scared him and caused him to present to the emergency department. Patient states that he had a negative exercise stress test 4-5 years ago. No history of diabetes, high cholesterol, several second-degree relatives with coronary artery disease, not any young age. Patient denies risk factors for pulmonary embolism including: unilateral leg swelling, history of DVT/PE/other blood clots, use of exogenous hormones, recent immobilizations, recent surgery, recent travel (>4hr segment), malignancy, hemoptysis.        History reviewed. No pertinent past medical history.  There are no active problems to display for this patient.   Past Surgical History:  Procedure Laterality Date  . ANTERIOR CRUCIATE LIGAMENT REPAIR    . FOOT SURGERY         Home Medications    Prior to Admission medications   Not on File    Family History No family history on file.  Social History Social History  Substance Use Topics  . Smoking status: Current Some Day Smoker  . Smokeless tobacco: Not on file  .  Alcohol use Yes     Allergies   Patient has no known allergies.   Review of Systems Review of Systems  Constitutional: Negative for diaphoresis and fever.  Eyes: Negative for redness.  Respiratory: Negative for cough and shortness of breath.   Cardiovascular: Positive for chest pain. Negative for palpitations and leg swelling.  Gastrointestinal: Negative for abdominal pain, nausea and vomiting.  Genitourinary: Negative for dysuria.  Musculoskeletal: Negative for back pain and neck pain.  Skin: Negative for rash.  Neurological: Negative for syncope and light-headedness.  Psychiatric/Behavioral: The patient is not nervous/anxious.      Physical Exam Updated Vital Signs BP 121/84   Pulse 66   Temp 98.3 F (36.8 C)   Resp 17   Ht 6' (1.829 m)   Wt 120.2 kg   SpO2 100%   BMI 35.94 kg/m   Physical Exam  Constitutional: He appears well-developed and well-nourished.  HENT:  Head: Normocephalic and atraumatic.  Mouth/Throat: Oropharynx is clear and moist and mucous membranes are normal. Mucous membranes are not dry.  Eyes: Conjunctivae are normal.  Neck: Trachea normal and normal range of motion. Neck supple. Normal carotid pulses and no JVD present. No muscular tenderness present. Carotid bruit is not present. No tracheal deviation present.  Cardiovascular: Normal rate, regular rhythm, S1 normal, S2 normal, normal heart sounds and intact distal pulses.  Exam reveals no distant heart sounds and no decreased pulses.   No murmur heard. Pulmonary/Chest: Effort normal and breath sounds normal. No respiratory distress. He has no wheezes. He exhibits no tenderness.  Abdominal: Soft. Normal aorta and  bowel sounds are normal. There is no tenderness. There is no rebound and no guarding.  Musculoskeletal: He exhibits no edema.  Neurological: He is alert.  Skin: Skin is warm and dry. He is not diaphoretic. No cyanosis. No pallor.  Psychiatric: He has a normal mood and affect.  Nursing  note and vitals reviewed.    ED Treatments / Results  Labs (all labs ordered are listed, but only abnormal results are displayed) Labs Reviewed  [place], ED    EKG  EKG Interpretation  Date/Time:  Wednesday April 27 2016 09:57:33 EST Ventricular Rate:  79 PR Interval:  156 QRS Duration: 74 QT Interval:  372 QTC Calculation: 426 R Axis:   12 Text Interpretation:  Normal sinus rhythm with sinus arrhythmia Normal ECG No previous ECGs available Confirmed by [facility] MD, ERIN ([id number]) on 04/27/2016 12:47:31 PM       Radiology Dg Chest 2 View  Result Date: 04/27/2016 CLINICAL DATA:  Chest pain radiating to the left shoulder yesterday. Abnormal pulse rate during exercise. Current smoker. EXAM: CHEST  2 VIEW COMPARISON:  None in PACs FINDINGS: The lungs are adequately inflated. The interstitial markings are coarse bilaterally. There is no alveolar infiltrate, pleural effusion, or pneumothorax. The heart and pulmonary vascularity are normal. The mediastinum is normal in width. The trachea is midline. The bony thorax exhibits no acute abnormality. IMPRESSION: Mild interstitial prominence bilaterally may be acute or chronic. There is no alveolar pneumonia nor CHF. Electronically Signed   By: David  Martinique M.D.   On: 04/27/2016 10:34    Procedures Procedures (including critical care time)  Medications Ordered in ED Medications - No data to display   Initial Impression / Assessment and Plan / ED Course  I have reviewed the triage vital signs and the nursing notes.  Pertinent labs & imaging results that were available during my care of the patient were reviewed by me and considered in my medical decision making (see chart for details).     Patient seen and examined. Work-up Reviewed with patient..   Vital signs reviewed and are as follows: BP 121/84   Pulse 66   Temp 98.3 F (36.8 C)   Resp 17   Ht 6' (1.829 m)   Wt 120.2 kg   SpO2  100%   BMI 35.94 kg/m    1:50 PM Patient was counseled to return with severe chest pain, especially if the pain is crushing or pressure-like and spreads to the arms, back, neck, or jaw, or if they have sweating, nausea, or shortness of breath with the pain. They were encouraged to call 911 with these symptoms.   Encouraged PCP follow-up in the next 1 week.  They were also told to return if their chest pain gets worse and does not go away with rest, they have an attack of chest pain lasting longer than usual despite rest and treatment with the medications their caregiver has prescribed, if they wake from sleep with chest pain or shortness of breath, if they feel dizzy or faint, if they have chest pain not typical of their usual pain, or if they have any other emergent concerns regarding their health.  The patient verbalized understanding and agreed.    Final Clinical Impressions(s) / ED Diagnoses   Final diagnoses:  Nonspecific chest pain   Patient with atypical short-lived right-sided chest pain. No significant associated symptoms. Workup in emergency department is negative. Troponin is negative, the event occurred greater than  12 hours prior to arrival. EKG is nonischemic. Heart score equals 3. No risk factors for PE. Symptoms are completely resolved. As to the incident today, patient was completely asymptomatic. I wonder if his heart rate was not being accurately measured. He did not have any chest pain, shortness of breath, dizziness, or lightheadedness with activity today. Feel that he is safe for discharge to home at this time with close PCP follow-up.  New Prescriptions There are no discharge medications for this patient.    Carlisle Cater, PA-C 04/27/16 [place], MD 05/03/16 [phone]

## 2017-11-29 ENCOUNTER — Encounter (INDEPENDENT_AMBULATORY_CARE_PROVIDER_SITE_OTHER): Payer: Self-pay | Admitting: Internal Medicine

## 2017-11-29 ENCOUNTER — Ambulatory Visit (FREE_STANDING_LABORATORY_FACILITY): Payer: TRICARE Prime—HMO | Admitting: Internal Medicine

## 2017-11-29 VITALS — BP 128/89 | HR 74 | Temp 97.7°F | Resp 12 | Ht 72.0 in | Wt 245.0 lb

## 2017-11-29 DIAGNOSIS — J309 Allergic rhinitis, unspecified: Secondary | ICD-10-CM

## 2017-11-29 DIAGNOSIS — J029 Acute pharyngitis, unspecified: Secondary | ICD-10-CM

## 2017-11-29 LAB — POCT RAPID STREP A: Rapid Strep A Screen POCT: NEGATIVE

## 2017-11-29 MED ORDER — FLUTICASONE PROPIONATE 50 MCG/ACT NA SUSP
2.0000 | Freq: Every day | NASAL | 0 refills | Status: AC
Start: 2017-11-29 — End: 2018-01-28

## 2017-11-29 MED ORDER — DEXAMETHASONE SODIUM PHOSPHATE 10 MG/ML IJ SOLN
10.0000 mg | Freq: Once | INTRAMUSCULAR | Status: AC
Start: 2017-11-29 — End: 2017-11-29
  Administered 2017-11-29: 09:00:00 10 mg via ORAL

## 2017-11-29 NOTE — Progress Notes (Signed)
[facility]  PROGRESS NOTE     Patient: Angel [name]   Date: 11/29/2017   MRN: [id number]       Angel [name] is a 47 y.o. male      HISTORY     Chief Complaint   Patient presents with   . Sore Throat     x 7 days, worsening past 2 dyas. HC:WCBJS drops, motrin, neti pot        48 yo M with Hx of TIA, PTSD, GERD and Arnold Chiari malformation, on PRN meds.  C/o acute onset X 1 week ago developed pharyngitis.  Took a protonix with minimal relief.  Hurts to swallow.  Throat is raw in AM.  No F/C, N/V.  3 days ago developed NC, sneezing.  No cough.  No SOB or myalgia.  No otalgia.  Ibuprofen with some relief.  Nyquil with some relief.          Review of Systems   Constitutional: Negative for chills and fever.   HENT: Positive for postnasal drip, sore throat and trouble swallowing. Negative for ear pain, sinus pain and sinus pressure.    Eyes: Negative for discharge and redness.   Respiratory: Negative for cough, shortness of breath and wheezing.    Cardiovascular: Negative for chest pain and palpitations.   Gastrointestinal: Negative for nausea and vomiting.   Endocrine: Negative for cold intolerance and polyuria.   Genitourinary: Negative for dysuria, flank pain and frequency.   Musculoskeletal: Negative for arthralgias and myalgias.   Skin: Negative for rash.   Allergic/Immunologic: Positive for environmental allergies.   Neurological: Negative for dizziness and headaches.   Hematological: Negative for adenopathy.   Psychiatric/Behavioral: Negative for agitation and confusion.       History:  Past Medical History:   Diagnosis Date   . Acid reflux    . Arnold-Chiari malformation, type I    . Cluster headache    . Hyperlipidemia    . Hypertensive disorder    . Meningitis    . PTSD (post-traumatic stress disorder)        Past Surgical History:   Procedure Laterality Date   . ACL REPAIR     . FOOT SURGERY     . SINUS SURGERY     . VASECTOMY         No family history on file.    Social History   Substance Use  Topics   . Smoking status: Light Tobacco Smoker     Years: 20.00   . Smokeless tobacco: Never Used      Comment: 2-3 cigarettes/day   . Alcohol use Yes      Comment: social       History reviewed.        Current Outpatient Prescriptions:   .  aspirin EC 81 MG EC tablet, Take 81 mg by mouth daily., Disp: , Rfl:   .  BUPROPION HCL PO, Take 1 tablet by mouth 2 (two) times daily. , Disp: , Rfl:   .  gabapentin (NEURONTIN) 100 MG capsule, Take 100 mg by mouth 3 (three) times daily., Disp: , Rfl:   .  ibuprofen (ADVIL,MOTRIN) 200 MG tablet, Take 200 mg by mouth every 6 (six) hours as needed., Disp: , Rfl:   .  pantoprazole (PROTONIX) 40 MG tablet, Take 40 mg by mouth daily, Disp: , Rfl:   .  simvastatin (ZOCOR) 20 MG tablet, Take 20 mg by mouth nightly, Disp: , Rfl:   .  lisinopril (PRINIVIL,ZESTRIL) 10 MG tablet, Take 10 mg by mouth daily., Disp: , Rfl:   .  omeprazole (PRILOSEC) 40 MG capsule, Take 40 mg by mouth daily., Disp: , Rfl:     Current Facility-Administered Medications:   .  dexamethasone (DECADRON) injection 10 mg, 10 mg, Oral, Once, Estalee Mccandlish Asif, MD    No Known Allergies    Medications and Allergies reviewed.    PHYSICAL EXAM     Vitals:    11/29/17 0837   BP: 128/89   Pulse: 74   Resp: 12   Temp: 97.7 F (36.5 C)   SpO2: 98%   Weight: 111.1 kg (245 lb)   Height: 1.829 m (6')       Physical Exam   Nursing note and vitals reviewed.  Constitutional: He is oriented to person, place, and time. He appears well-developed and well-nourished. No distress.   HENT:   Head: Normocephalic and atraumatic.   Right Ear: External ear normal.   Left Ear: External ear normal.   Mouth/Throat: Oropharynx is clear and moist. No oropharyngeal exudate.   Eyes: Conjunctivae and EOM are normal. Right eye exhibits no discharge. Left eye exhibits discharge.   Neck: Normal range of motion. Neck supple.   Pulmonary/Chest: Effort normal and breath sounds normal. No respiratory distress. He has no wheezes.   Musculoskeletal:  Normal range of motion.   Lymphadenopathy:     He has no cervical adenopathy.   Neurological: He is alert and oriented to person, place, and time.   Skin: Skin is warm and dry. No rash noted.   Psychiatric: He has a normal mood and affect. His behavior is normal. Judgment and thought content normal.         UCC COURSE       Results     Procedure Component Value Units Date/Time    Rapid Group A Strep [[id number]]  (Normal) Collected:  11/29/17 0846    Specimen:  Throat Updated:  11/29/17 0854     POCT QC Pass     Rapid Strep A Screen POCT Negative      Comment Negative Results should be confirmed by throat Cx to confirm absence of Strep A inf.            No results found.      Orders Placed This Encounter   Medications   . dexamethasone (DECADRON) injection 10 mg         PROCEDURES     Procedures       ASSESSMENT     Encounter Diagnoses   Name Primary?   . Sore throat    . Acute pharyngitis, unspecified etiology Yes   . Allergic rhinitis, unspecified seasonality, unspecified trigger           SSESSMENT    PLAN     Nasal saline several times a day.  Nasal steroid spray as directed.  Tylenol / ibuprofen as directed.  Push fluids.  Follow up with your doctor in 2-3 days as needed.    Discussed results and diagnosis with patient/family.  Reviewed warning signs for worsening condition, as well as, indications for follow-up with pmd and return to urgent care clinic.   Patient/family expressed understanding of instructions.    Orders Placed This Encounter   Procedures   . Throat Culture ([place])   . Rapid Group A Strep         An After Visit Summary was printed and given to the patient.  Signed,  Braden Cimo Asif Gianni Mihalik, MD

## 2017-11-29 NOTE — Patient Instructions (Signed)
Adult Self-Care for Colds    Colds are caused by viruses. They can't be cured with antibiotics. However, you can ease symptoms and support your body's efforts to heal itself. No matter which symptoms you have, be sure to:   Drink plenty of fluids (water or clear soup)   Stop smoking and drinking alcohol   Get plenty of rest  Understand a fever   Take your temperature several times a day. If your fever is100.65F(38.0C) for more than a day, call your healthcare provider.   Relax, lie down. Go to bed if you want. Just get off your feet and rest. Also, drink plenty of fluids to avoid dehydration.   Take acetaminophen or a nonsteroidal anti-inflammatory agent (NSAID), such as ibuprofen.    Treat a troubled nose kindly   Breathe steam or heated humidified air to open blocked nasal passages. Stand in a hot shower or use a vaporizer. Be careful not to get burned by the steam.   Saline nasal sprays and decongestant tablets help open a stuffy nose. Antihistamines can also help, but they can cause side effects such as drowsiness and drying of the eyes, nose, and mouth.    Soothe a sore throat and cough   Gargle every2hours with1/4teaspoon of salt dissolved in1/2 cup of warm water. Suck on throat lozenges and cough drops to moisten your throat.   Cough medicines are available but it is unclear how well they actually work.   Take acetaminophen or an NSAID, such as ibuprofen, to ease throat pain    Ease digestive problems   Put fluids back into your body. Take frequent sips of clear liquids such as water or broth. Avoid drinks that have a lot of sugar in them, such as juices and sodas. These can make diarrhea worse. Older children and adults can drink sports drinks.   As your appetite returns, you can resume your normal diet. Ask your healthcare provider if there are any foods you should avoid.    When you first notice symptoms, ask your healthcare provider if antiviral medicines are  appropriate.Antibiotics should not be taken for colds or flu. Also, call your healthcare provider if you have any of the following symptoms or if you aren't feeling better after 7 days:   Shortness of breath   Pain or pressure in the chest or belly (abdomen)   Worsening symptoms, especially after a period of improvement   Fever of100.65F (38.0C) or higher, or fever that doesn't go down with medicine   Sudden dizziness or confusion   Severe or continued vomiting   Signs of dehydration, including extreme thirst, dark urine, infrequent urination, dry mouth   Spotted, red, or very sore throat  Date Last Reviewed: 03/05/2015   2000-2019 [employer], [place]. [address], [place], [place] [address]. All rights reserved. This information is not intended as a substitute for professional medical care. Always follow your healthcare professional's instructions.      Nasal saline several times a day.  Nasal steroid spray as directed.  Tylenol / ibuprofen as directed.  Push fluids.  Follow up with your doctor in 2-3 days as needed.

## 2018-06-21 ENCOUNTER — Telehealth: Admitting: Physician Assistant

## 2018-06-21 ENCOUNTER — Telehealth: Payer: Self-pay | Admitting: Emergency Medicine

## 2018-06-21 DIAGNOSIS — Z20828 Contact with and (suspected) exposure to other viral communicable diseases: Secondary | ICD-10-CM

## 2018-06-21 NOTE — Progress Notes (Signed)
Based on what you shared with me, I feel that you are considered high risk for Corona virus virus because of a known exposure, fever, shortness of breath and cough.  You should proceed to our testing site at [address] [place] [address].   I have placed an order for you to have the cornoavirus (COVID19) test done.  - You will be tested for (COVID-19) and discharged home on quarantine except to seek medical care if symptoms worsen, and asked to  - Stay home and avoid contact with others until you get your results (4-5 days)  - Avoid travel on public transportation if possible (such as bus, train, or airplane)  Continue to monitor at home and seek medical attention if your symptoms worsen.  If you are having a medical emergency, call 911.   Please review the forms below as these are required. PRINT, sign and complete and bring with you if possible to the testing site.     Person Under Monitoring Name: Casey Park  Location: [place] [address]   CORONAVIRUS DISEASE 2019 (COVID-19) Guidance for Persons Under Investigation You are being tested for the virus that causes coronavirus disease 2019 (COVID-19). Public health actions are necessary to ensure protection of your health and the health of others, and to prevent further spread of infection. COVID-19 is caused by a virus that can cause symptoms, such as fever, cough, and shortness of breath. The primary transmission from person to person is by coughing or sneezing. On May 03, 2018, the [place] announced a [employer] Emergency of International Concern and on May 04, 2018 the U.S. Department of Health and Human Services declared a public health emergency. If the virus that causesCOVID-19 spreads in the community, it could have severe public health consequences.  As a person under investigation for COVID-19, the [facility] advises you to adhere to the following guidance until your test results are reported to you. If your test result is positive, you will receive additional information from your provider and your local health department at that time.   Remain at home until you are cleared by your health provider or public health authorities.   Keep a log of visitors to your home using the form provided. Any visitors to your home must be aware of your isolation status.  If you plan to move to a new address or leave the county, notify the local health department in your county.  Call a doctor or seek care if you have an urgent medical need. Before seeking medical care, call ahead and get instructions from the provider before arriving at the medical office, clinic or hospital. Notify them that you are being tested for the virus that causes COVID-19 so arrangements can be made, as necessary, to prevent transmission to others in the healthcare setting. Next, notify the local health department in your county.  If a medical emergency arises and you need to call 911, inform the first responders that you are being tested for the virus that causes COVID-19. Next, notify the local health department in your county.  Adhere to all guidance set forth by the [place] for [facility] of patients that is based on guidance from the Center for Disease Control and Prevention with suspected or confirmed COVID-19. It is provided with this guidance for Persons Under Investigation.  Your health and the health  of our community are our top priorities. Public Health officials remain available to provide assistance and counseling to you about COVID-19 and compliance with this guidance.  Provider: ______________William Daun Peacock, PA-C______________________________________________ Date: ___3___/__19___/___2020______  By signing below, you acknowledge that you have read and agree to comply with this  Guidance for Persons Under Investigation. ______________________________________________________________ Date: ______/_____/_________  WHO DO I CALL? You can find a list of local health departments here: [email or web address] Health Department: ____________________________________________________________________ Contact Name: ________________________________________________________________________ Telephone: ___________________________________________________________________________  Marice Potter, [place], Communicable Disease Branch COVID-19 Guidance for Persons Under Investigation June 09, 2018   Person Under Monitoring Name: Casey Park  Location: [place] [address]   Record here the list of visitors to your home since you became ill with respiratory symptoms that led you to consult a health provider:  Visitor Name Date Time In Time Out Did this person come within 6 feet of you? Indicate Y or N Relationship to Person Under Monitoring Phone number Comments   ___/____/____ __:__ AM/PM __:__ AM/PM       ___/____/____ __:__ AM/PM __:__ AM/PM       ___/____/____ __:__ AM/PM __:__ AM/PM       ___/____/____ __:__ AM/PM __:__ AM/PM       ___/____/____ __:__ AM/PM __:__ AM/PM       ___/____/____ __:__ AM/PM __:__ AM/PM       ___/____/____ __:__ AM/PM __:__ AM/PM       ___/____/____ __:__ AM/PM __:__ AM/PM       ___/____/____ __:__ AM/PM __:__ AM/PM       ___/____/____ __:__ AM/PM __:__ AM/PM       ___/____/____ __:__ AM/PM __:__ AM/PM       ___/____/____ __:__ AM/PM __:__ AM/PM       ___/____/____ __:__ AM/PM __:__ AM/PM       ___/____/____ __:__ AM/PM __:__ AM/PM       Marice Potter, [place], Communicable Disease Branch

## 2018-06-21 NOTE — Progress Notes (Signed)
I have spent 5 minutes in review of e-visit questionnaire, review and updating patient chart, medical decision making and response to patient.   Djon Tith Cody Nicloe Frontera, PA-C    

## 2018-06-25 ENCOUNTER — Ambulatory Visit: Payer: Self-pay | Admitting: *Deleted

## 2018-06-25 NOTE — Telephone Encounter (Signed)
Pt calling for results of COVID -19 test, performed 06/21/2018. Advised practice will call him as soon as those are released. Pt verbalized understanding.  Reason for Disposition . General information question, no triage required and triager able to answer question  Answer Assessment - Initial Assessment Questions 1. REASON FOR CALL or QUESTION: "What is your reason for calling today?" or "How can I best help you?" or "What question do you have that I can help answer?"     Calling for results of COVID -19 testing done 06/21/2018  Protocols used: INFORMATION ONLY CALL-A-AH

## 2018-06-27 ENCOUNTER — Telehealth: Payer: Self-pay | Admitting: Emergency Medicine

## 2018-06-27 NOTE — Telephone Encounter (Signed)
No results yet. He will know as soon as I know. [facility] should be calling them directly with results as they get the result before I do, but I promise as soon as the result is sent I will call. There is now a 7-9 day turnaround for results.   We can check with [facility] to get an update if you are willing.

## 2018-06-27 NOTE — Telephone Encounter (Signed)
Advised patient that lab results have not resulted yet. Delay in turn around testing. As soon as labs are resulted will give patient a call

## 2018-06-27 NOTE — Telephone Encounter (Signed)
Copied from [place] #[id number]. Topic: General - Inquiry >> Jun 27, 2018 10:18 AM Margot Ables wrote: Reason for CRM: pt called [place] office asking for results from Elyn Aquas, [place] for [place] testing. Pt had e-visit with provider. Pt was advised results may take 7-9 days based on recent information. Please advise.

## 2018-06-28 NOTE — Telephone Encounter (Signed)
Spoke with patient. Informed that COVID-19 results are still being processed.

## 2018-06-28 NOTE — Telephone Encounter (Signed)
Would you please help and call [facility] to see if they have an update -- he was sent to tent for testing.

## 2018-06-28 NOTE — Telephone Encounter (Signed)
Patient is calling back today to check on results of testing- he states he is going stir crazy waiting for results. He is feeling well- apologized for his wait and thanked him for his patience. Told him as soon as we could notify him we would.

## 2018-06-28 NOTE — Telephone Encounter (Signed)
Please let patient know in my absence. Thank you.

## 2018-06-28 NOTE — Telephone Encounter (Signed)
Spoke with [employer] Rep. [facility] is no longer doing updates on COVID-19 testing. Once specimen is received, order will say active until results are put in and ordering provider is contacted. This is [facility]'s effort to test more samples at a faster rate.

## 2018-06-29 ENCOUNTER — Telehealth: Payer: Self-pay | Admitting: Physician Assistant

## 2018-06-29 ENCOUNTER — Encounter: Payer: Self-pay | Admitting: Physician Assistant

## 2018-06-29 LAB — NOVEL CORONAVIRUS, NAA: SARS-CoV-2, NAA: NOT DETECTED

## 2018-06-29 NOTE — Telephone Encounter (Signed)
Pt. Calling to get COVID 19 test results. Reassured pt. He will be notified as soon as results are ready.

## 2019-04-29 ENCOUNTER — Ambulatory Visit: Attending: Internal Medicine

## 2019-04-29 DIAGNOSIS — Z20822 Contact with and (suspected) exposure to covid-19: Secondary | ICD-10-CM

## 2019-04-30 ENCOUNTER — Telehealth: Payer: Self-pay | Admitting: Hematology

## 2019-04-30 LAB — NOVEL CORONAVIRUS, NAA: SARS-CoV-2, NAA: NOT DETECTED

## 2019-04-30 NOTE — Telephone Encounter (Signed)
Pt is aware covid 19 test is neg on 04-30-2019

## 2019-05-03 ENCOUNTER — Ambulatory Visit: Attending: Internal Medicine

## 2019-05-03 DIAGNOSIS — Z20822 Contact with and (suspected) exposure to covid-19: Secondary | ICD-10-CM

## 2019-05-04 LAB — NOVEL CORONAVIRUS, NAA: SARS-CoV-2, NAA: NOT DETECTED

## 2019-05-06 ENCOUNTER — Ambulatory Visit: Attending: Internal Medicine

## 2019-12-09 ENCOUNTER — Emergency Department (HOSPITAL_COMMUNITY)

## 2019-12-09 ENCOUNTER — Other Ambulatory Visit: Payer: Self-pay

## 2019-12-09 ENCOUNTER — Encounter (HOSPITAL_COMMUNITY): Payer: Self-pay

## 2019-12-09 ENCOUNTER — Observation Stay (HOSPITAL_COMMUNITY)
Admission: EM | Admit: 2019-12-09 | Discharge: 2019-12-10 | Disposition: A | Discharge status: Home / Self Care | Attending: Internal Medicine | Admitting: Internal Medicine

## 2019-12-09 DIAGNOSIS — I82401 Acute embolism and thrombosis of unspecified deep veins of right lower extremity: Secondary | ICD-10-CM | POA: Diagnosis not present

## 2019-12-09 DIAGNOSIS — I2699 Other pulmonary embolism without acute cor pulmonale: Secondary | ICD-10-CM | POA: Diagnosis not present

## 2019-12-09 DIAGNOSIS — Z7982 Long term (current) use of aspirin: Secondary | ICD-10-CM | POA: Insufficient documentation

## 2019-12-09 DIAGNOSIS — R1310 Dysphagia, unspecified: Secondary | ICD-10-CM

## 2019-12-09 DIAGNOSIS — Z8673 Personal history of transient ischemic attack (TIA), and cerebral infarction without residual deficits: Secondary | ICD-10-CM

## 2019-12-09 DIAGNOSIS — I10 Essential (primary) hypertension: Secondary | ICD-10-CM | POA: Diagnosis present

## 2019-12-09 DIAGNOSIS — F17210 Nicotine dependence, cigarettes, uncomplicated: Secondary | ICD-10-CM | POA: Diagnosis not present

## 2019-12-09 DIAGNOSIS — I824Z1 Acute embolism and thrombosis of unspecified deep veins of right distal lower extremity: Secondary | ICD-10-CM | POA: Diagnosis present

## 2019-12-09 DIAGNOSIS — Z20822 Contact with and (suspected) exposure to covid-19: Secondary | ICD-10-CM | POA: Diagnosis not present

## 2019-12-09 DIAGNOSIS — Z79899 Other long term (current) drug therapy: Secondary | ICD-10-CM | POA: Diagnosis not present

## 2019-12-09 DIAGNOSIS — R079 Chest pain, unspecified: Secondary | ICD-10-CM | POA: Diagnosis present

## 2019-12-09 DIAGNOSIS — E785 Hyperlipidemia, unspecified: Secondary | ICD-10-CM | POA: Diagnosis present

## 2019-12-09 DIAGNOSIS — F4310 Post-traumatic stress disorder, unspecified: Secondary | ICD-10-CM | POA: Diagnosis not present

## 2019-12-09 HISTORY — DX: Gastro-esophageal reflux disease without esophagitis: K21.9

## 2019-12-09 HISTORY — DX: Hyperlipidemia, unspecified: E78.5

## 2019-12-09 HISTORY — DX: Post-traumatic stress disorder, unspecified: F43.10

## 2019-12-09 HISTORY — DX: Essential (primary) hypertension: I10

## 2019-12-09 LAB — BASIC METABOLIC PANEL
Anion gap: 7 (ref 5–15)
BUN: 11 mg/dL (ref 6–20)
CO2: 26 mmol/L (ref 22–32)
Calcium: 8.8 mg/dL — ABNORMAL LOW (ref 8.9–10.3)
Chloride: 106 mmol/L (ref 98–111)
Creatinine, Ser: 1.12 mg/dL (ref 0.61–1.24)
GFR calc Af Amer: >60 mL/min (ref >60)
GFR calc non Af Amer: >60 mL/min (ref >60)
Glucose, Bld: 105 mg/dL — ABNORMAL HIGH (ref 70–99)
Potassium: 4 mmol/L (ref 3.5–5.1)
Sodium: 139 mmol/L (ref 135–145)

## 2019-12-09 LAB — CBC
HCT: 39.5 % (ref 39.0–52.0)
Hemoglobin: 13.8 g/dL (ref 13.0–17.0)
MCH: 30.7 pg (ref 26.0–34.0)
MCHC: 34.9 g/dL (ref 30.0–36.0)
MCV: 87.8 fL (ref 80.0–100.0)
Platelets: 168 10*3/uL (ref 150–400)
RBC: 4.5 MIL/uL (ref 4.22–5.81)
RDW: 13.3 % (ref 11.5–15.5)
WBC: 10.2 10*3/uL (ref 4.0–10.5)
nRBC: 0 % (ref 0.0–0.2)

## 2019-12-09 LAB — D-DIMER, QUANTITATIVE: D-Dimer, Quant: 2.79 ug/mL-FEU — ABNORMAL HIGH (ref 0.00–0.50)

## 2019-12-09 LAB — SARS CORONAVIRUS 2 BY RT PCR (HOSPITAL ORDER, PERFORMED IN ~~LOC~~ HOSPITAL LAB): SARS Coronavirus 2: NEGATIVE

## 2019-12-09 LAB — POC SARS CORONAVIRUS 2 AG -  ED: SARS Coronavirus 2 Ag: NEGATIVE

## 2019-12-09 LAB — TROPONIN I (HIGH SENSITIVITY)
Troponin I (High Sensitivity): 3 ng/L (ref <18)
Troponin I (High Sensitivity): 4 ng/L (ref <18)

## 2019-12-09 IMAGING — CR DG CHEST 2V
2 series · 2 of 2 positions shown · non-contrast
Comparison: [DATE]

CLINICAL DATA: Chest discomfort since last night; patient states
that the pain intensifies when he takes breath in, it feels like
when he takes in very cold air. No known cardiopulmonary problems;
occasional smoker x 15 years; HTN

EXAM:
CHEST - 2 VIEW

[w chest pa]
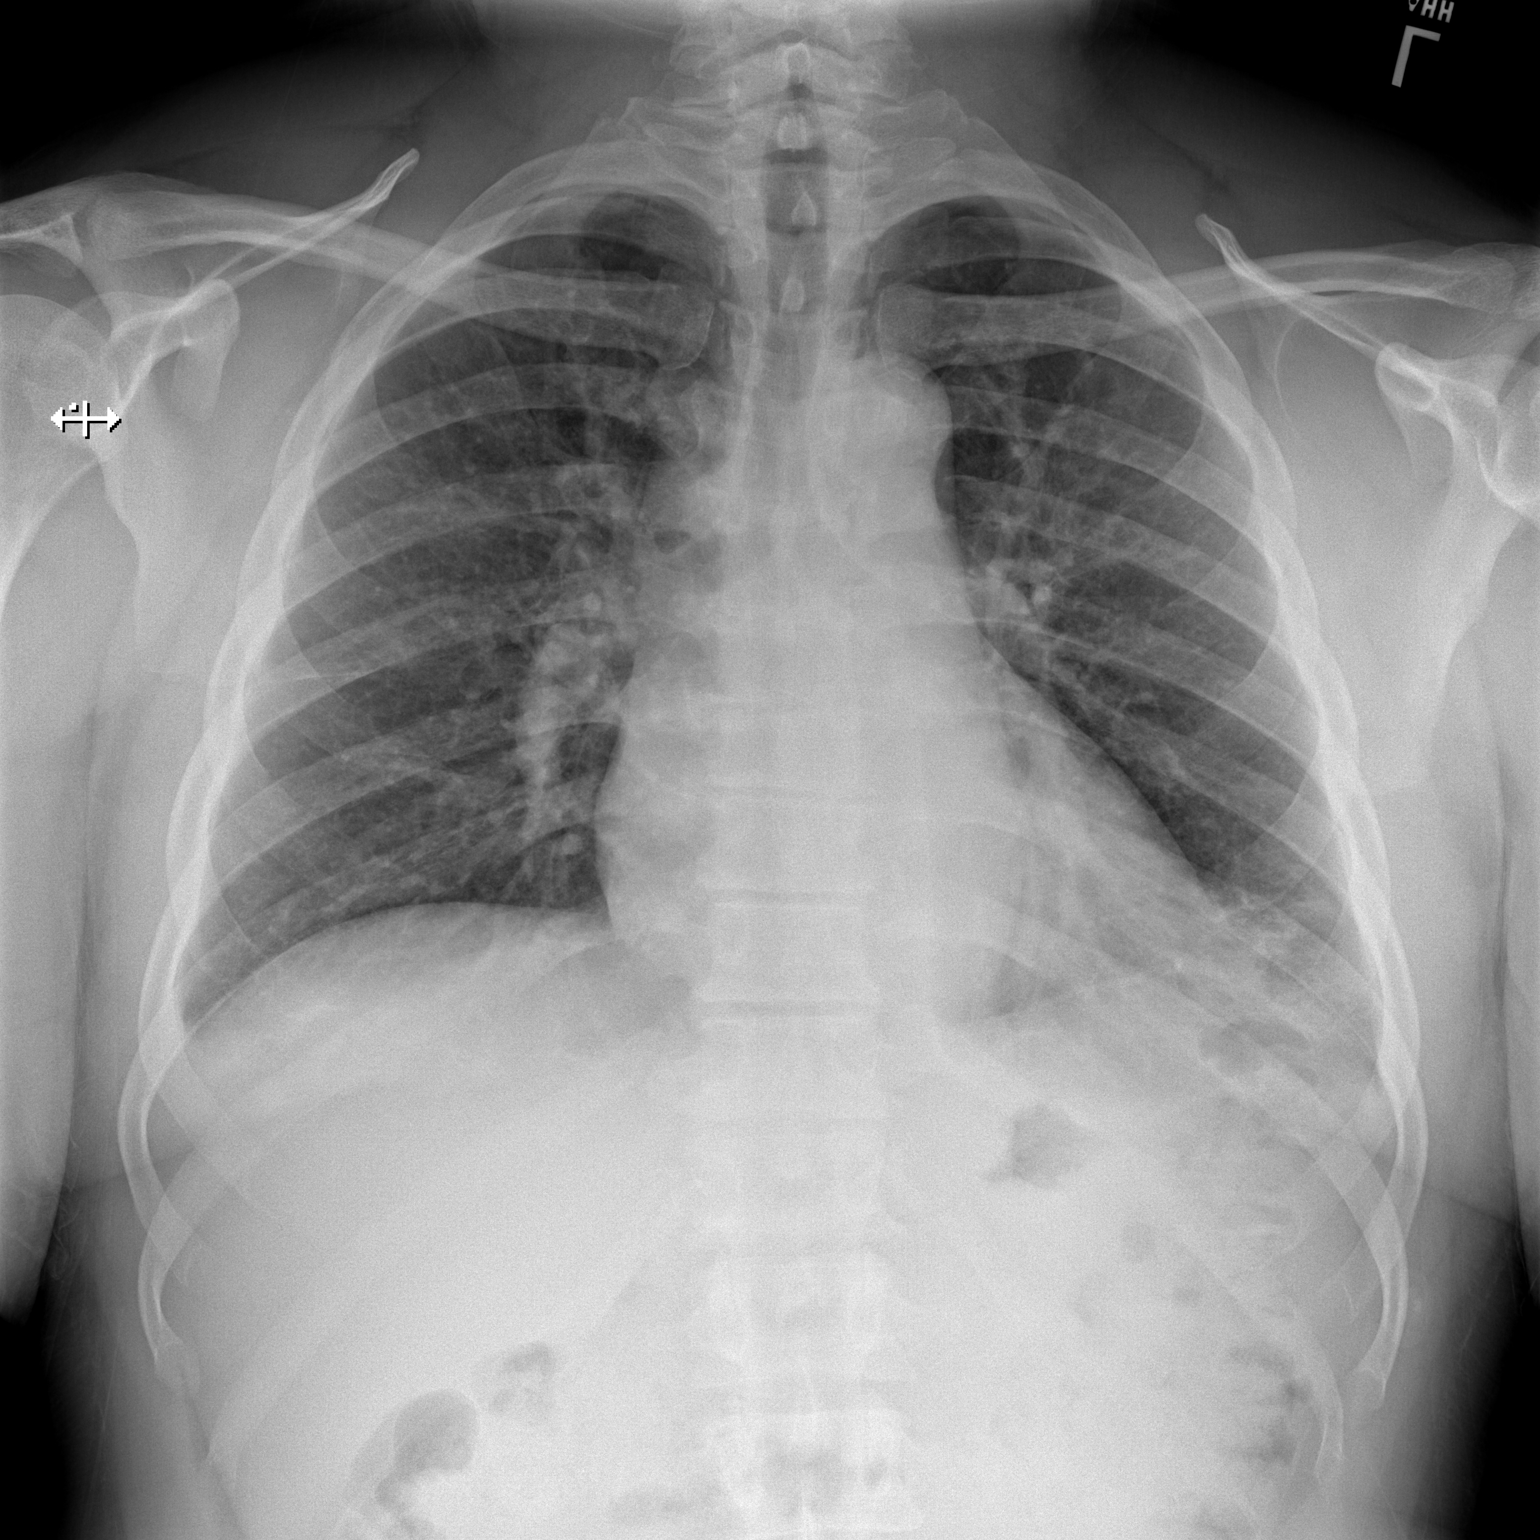

[w chest lat]
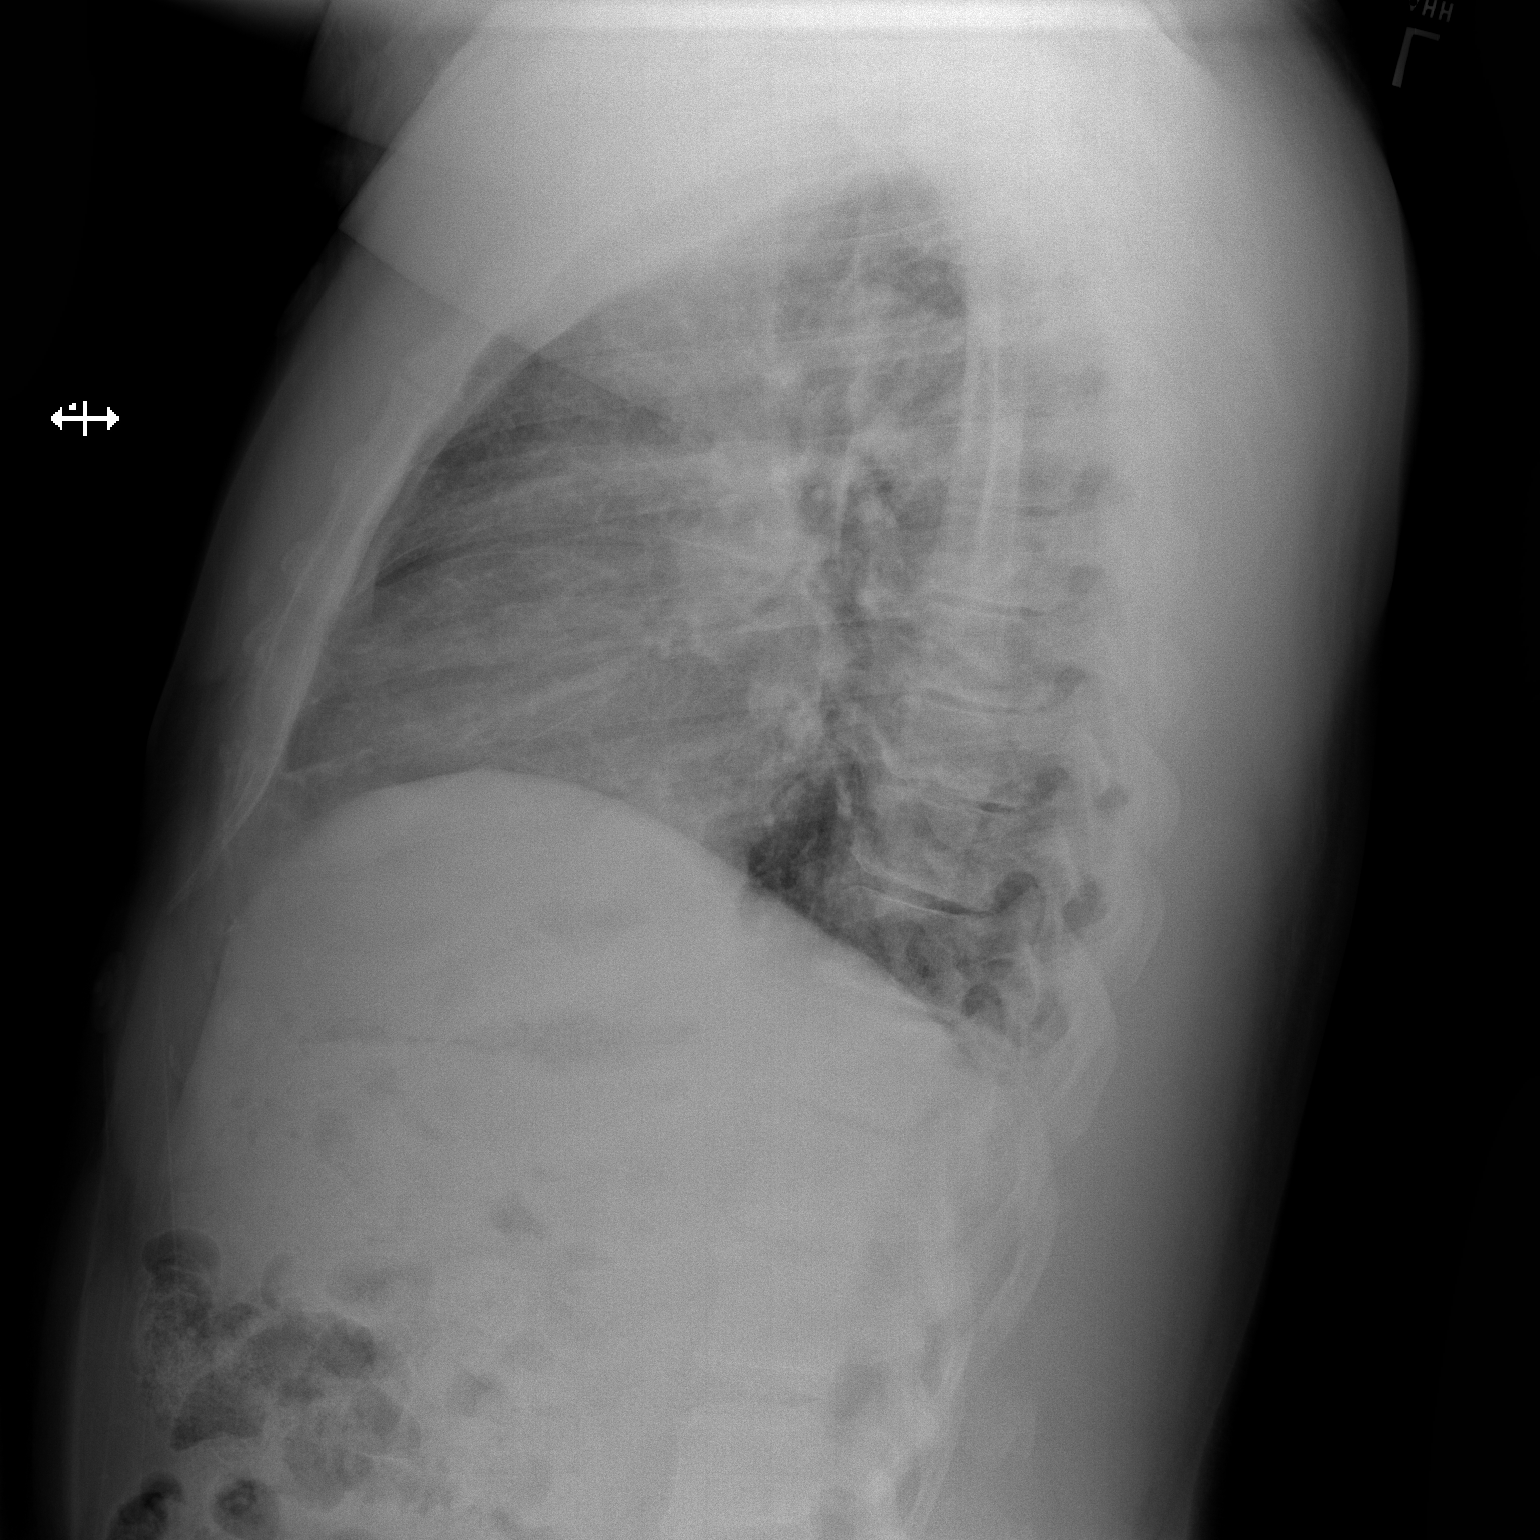

[2 of 2 positions shown; findings below may reference images not displayed]

FINDINGS: Small area of opacity at the left lateral lung base best seen on the
frontal view, new since the prior exam. Remainder of the lungs is
clear.

No pleural effusion or pneumothorax.

Cardiac silhouette is normal in size. No mediastinal or hilar masses
or evidence of adenopathy.

Skeletal structures are intact.
IMPRESSION: 1. Small area of opacity at the left lateral lung base. This may
reflect pneumonia or be due to atelectasis/scarring. No other
evidence of acute cardiopulmonary disease.

## 2019-12-09 IMAGING — CT CT ANGIO CHEST
2 of 6 series · 18 of 36 positions shown · IV contrast (OMNIPAQUE 350)
Comparison: [DATE] plain film.  No prior CT.

CLINICAL DATA: Chest pain.  Pleuritic.

EXAM:
CT ANGIOGRAPHY CHEST WITH CONTRAST
TECHNIQUE: Multidetector CT imaging of the chest was performed using the
standard protocol during bolus administration of intravenous
contrast. Multiplanar CT image reconstructions and MIPs were
obtained to evaluate the vascular anatomy.
CONTRAST:  100mL OMNIPAQUE IOHEXOL 350 MG/ML SOLN

[Series 5: thins · axial · 0.68mm/px · z∈[+1369,+1649]mm · 17 of 316 slices shown]
[im 18/316  lung]
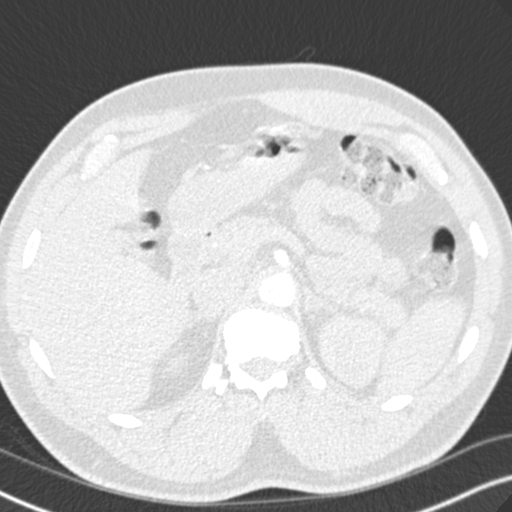
[im 36/316  mediastinal]
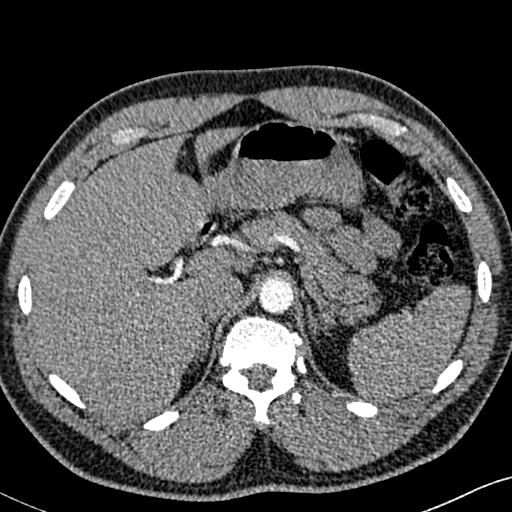
[im 53/316  lung]
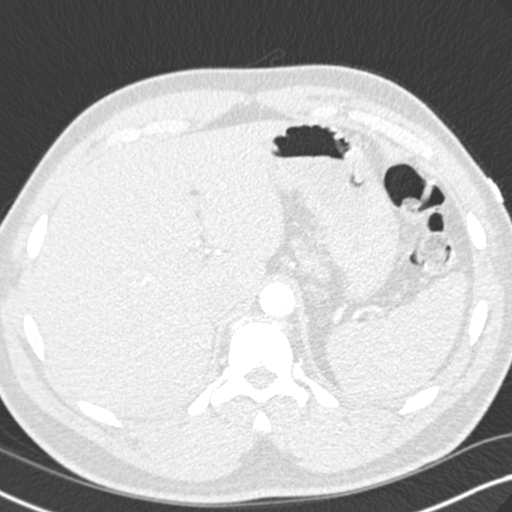
[im 71/316  mediastinal]
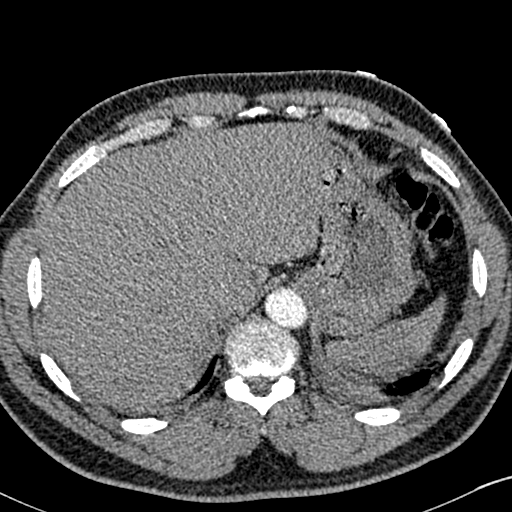
[im 88/316  lung]
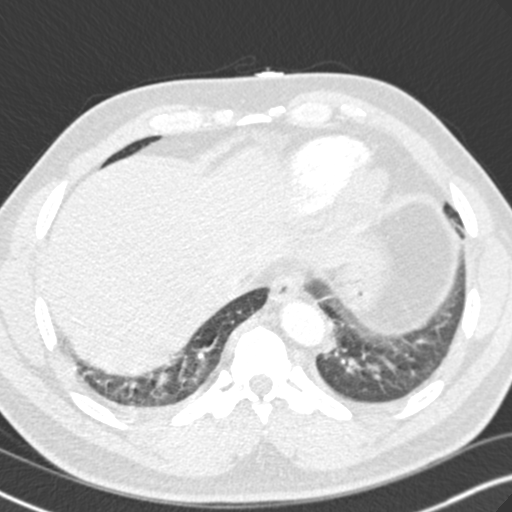
[im 106/316  mediastinal]
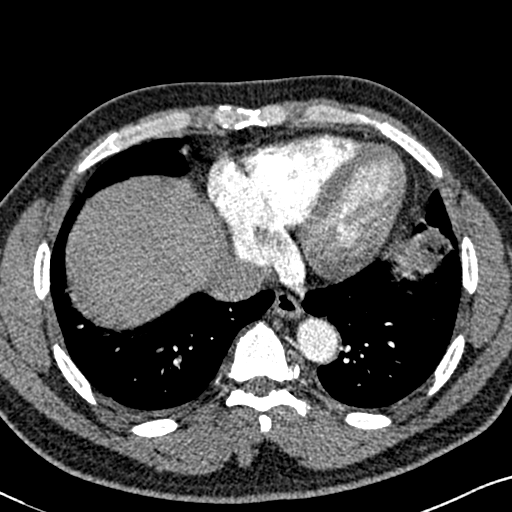
[im 123/316  lung]
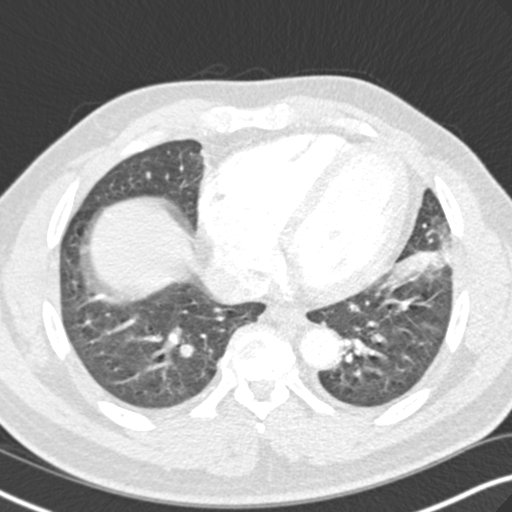
[im 141/316  mediastinal]
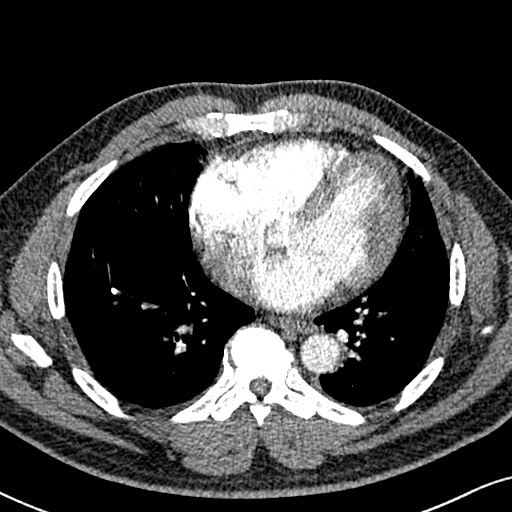
[im 158/316  lung]
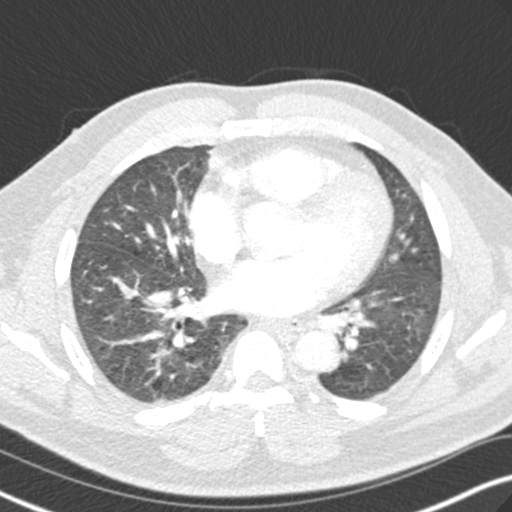
[im 176/316  mediastinal]
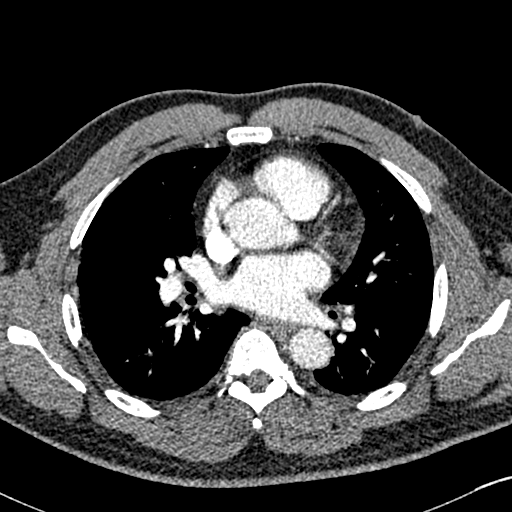
[im 193/316  lung]
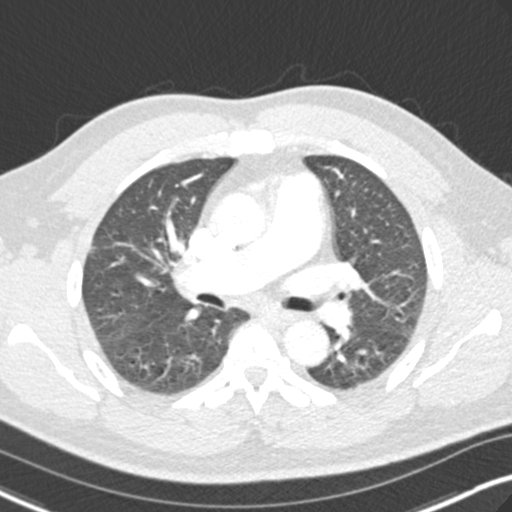
[im 211/316  mediastinal]
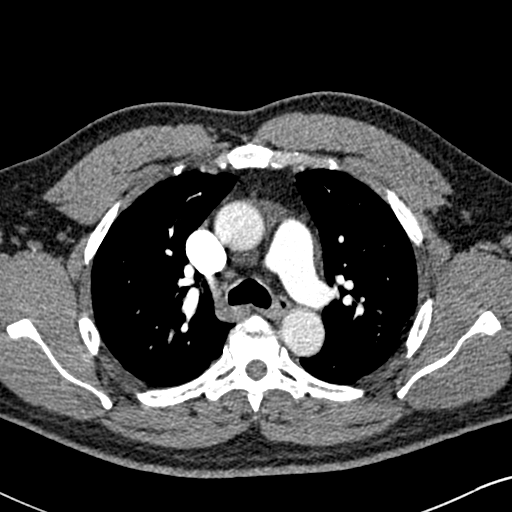
[im 228/316  lung]
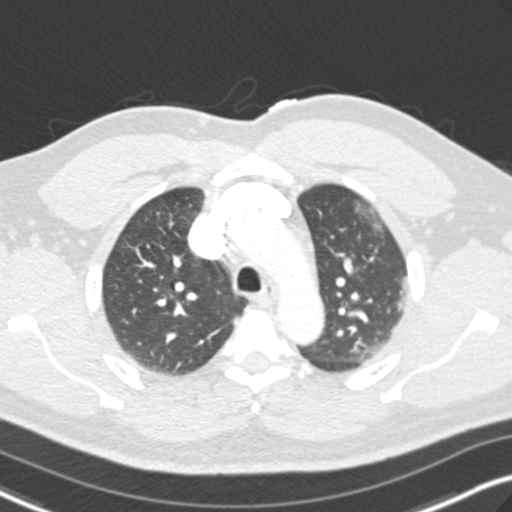
[im 246/316  mediastinal]
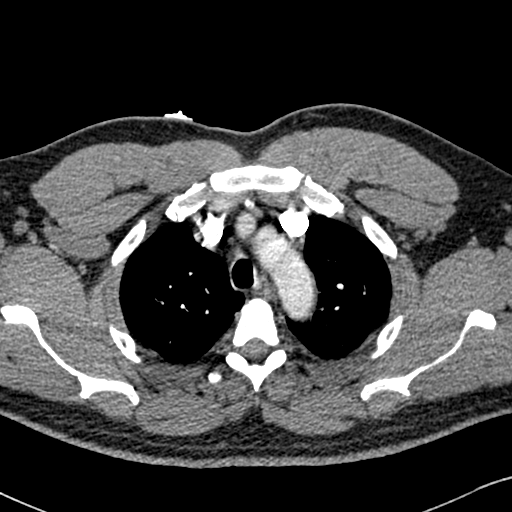
[im 263/316  lung]
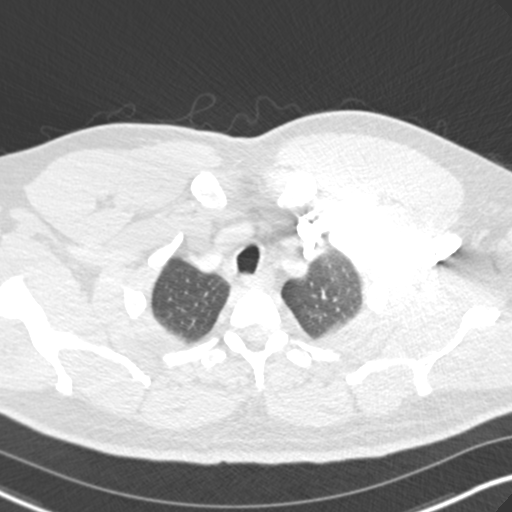
[im 281/316  mediastinal]
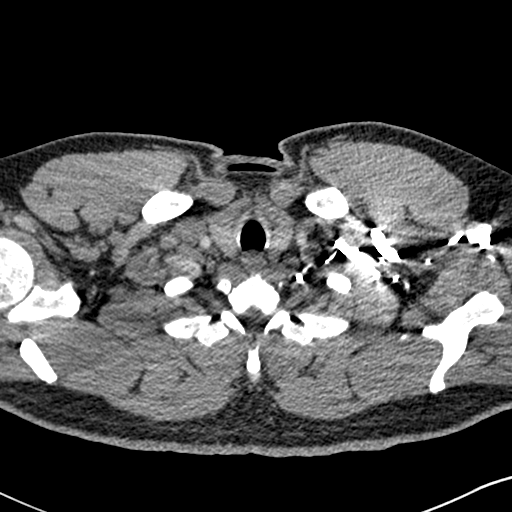
[im 298/316  lung]
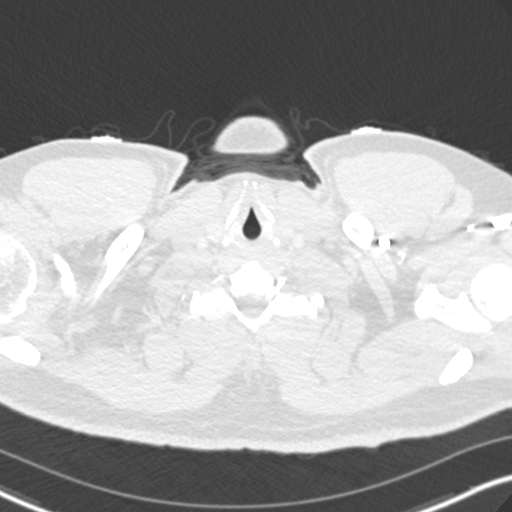

[Series 7: coronal mpr · coronal · 0.66mm/px · 1 of 147 slices shown]
[im 74/147  mediastinal]
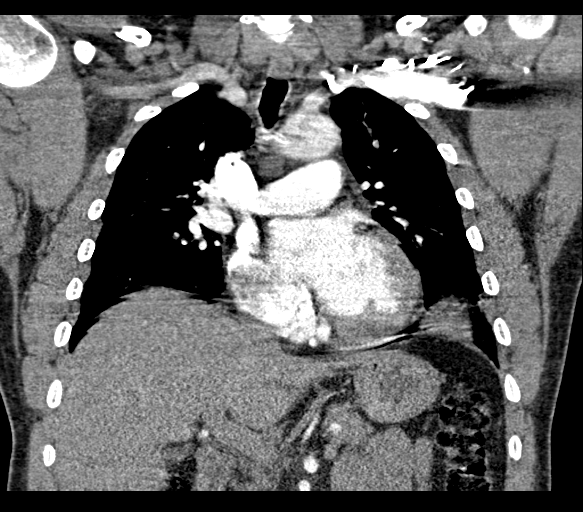

[18 of 36 positions shown; findings below may reference images not displayed]

FINDINGS: Cardiovascular: The quality of this exam for evaluation of pulmonary
embolism is poor. In addition to motion, the bolus is suboptimally
timed, contrast centered in the brachiocephalic veins and SVC.
Suspicion of isolated anterior left upper lobe subsegmental
pulmonary embolism, including on 86/5, coronal image 79.

No central or large lobar pulmonary embolism identified.

Normal aortic caliber. Borderline cardiomegaly, without pericardial
effusion. Pulmonary artery enlargement, outflow tract 3.2 cm

Mediastinum/Nodes: No mediastinal or hilar adenopathy.

Lungs/Pleura: Trace bilateral pleural fluid. Mild motion degradation
inferiorly. Peripheral predominant mild ground-glass opacities,
worse on the left than right.

Dense left lower lobe consolidation, including on 105/6.

Upper Abdomen: Motion degradation continuing. Normal imaged portions
of the liver, spleen, stomach, pancreas, gallbladder, adrenal
glands, kidneys.

Musculoskeletal: No acute osseous abnormality.

Review of the MIP images confirms the above findings.
IMPRESSION: 1. Poor quality exam for evaluation of pulmonary embolism. Suspicion
of a subsegmental pulmonary embolism to the anterior left upper
lobe. Of questionable clinical significance. Otherwise, no evidence
of pulmonary embolism to the large lobar level.
2. Subtle peripheral ground-glass opacities which are suspicious for
mild [BO] pneumonia.
3. Dense consolidation in the left lung base which could represent a
focus of infection or infarct if the patient has pulmonary emboli.
As this is plain film visible, recommend radiographic follow-up to
confirm resolution.
4. Trace bilateral pleural fluid.

## 2019-12-09 MED ORDER — ROSUVASTATIN CALCIUM 10 MG PO TABS
10.0000 mg | ORAL_TABLET | Freq: Every day | ORAL | Status: DC
  Administered 2019-12-10: 10 mg via ORAL
  Filled 2019-12-09: qty 1

## 2019-12-09 MED ORDER — ACETAMINOPHEN 325 MG PO TABS: 650.0000 mg | ORAL_TABLET | Freq: Four times a day (QID) | ORAL | Status: DC | PRN

## 2019-12-09 MED ORDER — ONDANSETRON HCL 4 MG PO TABS: 4.0000 mg | ORAL_TABLET | Freq: Four times a day (QID) | ORAL | Status: DC | PRN

## 2019-12-09 MED ORDER — ASPIRIN EC 81 MG PO TBEC
81.0000 mg | DELAYED_RELEASE_TABLET | Freq: Every day | ORAL | Status: DC
  Administered 2019-12-10: 81 mg via ORAL
  Filled 2019-12-09: qty 1

## 2019-12-09 MED ORDER — PANTOPRAZOLE SODIUM 40 MG PO TBEC
40.0000 mg | DELAYED_RELEASE_TABLET | Freq: Every day | ORAL | Status: DC
  Administered 2019-12-10: 40 mg via ORAL
  Filled 2019-12-09: qty 1

## 2019-12-09 MED ORDER — BUPROPION HCL ER (XL) 150 MG PO TB24
150.0000 mg | ORAL_TABLET | Freq: Every morning | ORAL | Status: DC
  Administered 2019-12-10: 150 mg via ORAL
  Filled 2019-12-09: qty 1

## 2019-12-09 MED ORDER — ACETAMINOPHEN 650 MG RE SUPP: 650.0000 mg | Freq: Four times a day (QID) | RECTAL | Status: DC | PRN

## 2019-12-09 MED ORDER — HEPARIN BOLUS VIA INFUSION
5400.0000 [IU] | Freq: Once | INTRAVENOUS | Status: AC
  Administered 2019-12-10: 5400 [IU] via INTRAVENOUS
  Filled 2019-12-09: qty 5400

## 2019-12-09 MED ORDER — GABAPENTIN 100 MG PO CAPS
100.0000 mg | ORAL_CAPSULE | Freq: Three times a day (TID) | ORAL | Status: DC
  Administered 2019-12-10: 100 mg via ORAL
  Filled 2019-12-09 (×2): qty 1

## 2019-12-09 MED ORDER — ONDANSETRON HCL 4 MG/2ML IJ SOLN: 4.0000 mg | Freq: Four times a day (QID) | INTRAMUSCULAR | Status: DC | PRN

## 2019-12-09 MED ORDER — HYDROCODONE-ACETAMINOPHEN 5-325 MG PO TABS: 1.0000 | ORAL_TABLET | ORAL | Status: DC | PRN

## 2019-12-09 MED ORDER — IOHEXOL 350 MG/ML SOLN
100.0000 mL | Freq: Once | INTRAVENOUS | Status: AC | PRN
  Administered 2019-12-09: 100 mL via INTRAVENOUS

## 2019-12-09 MED ORDER — LISINOPRIL 10 MG PO TABS
10.0000 mg | ORAL_TABLET | Freq: Every day | ORAL | Status: DC
  Administered 2019-12-10: 10 mg via ORAL
  Filled 2019-12-09: qty 1

## 2019-12-09 MED ORDER — SENNOSIDES-DOCUSATE SODIUM 8.6-50 MG PO TABS: 1.0000 | ORAL_TABLET | Freq: Every evening | ORAL | Status: DC | PRN

## 2019-12-09 MED ORDER — SODIUM CHLORIDE 0.9% FLUSH
3.0000 mL | Freq: Two times a day (BID) | INTRAVENOUS | Status: DC
  Administered 2019-12-10: 3 mL via INTRAVENOUS

## 2019-12-09 MED ORDER — HEPARIN (PORCINE) 25000 UT/250ML-% IV SOLN
1700.0000 [IU]/h | INTRAVENOUS | Status: DC
  Administered 2019-12-10 (×2): 1700 [IU]/h via INTRAVENOUS
  Filled 2019-12-09 (×2): qty 250

## 2019-12-09 NOTE — ED Provider Notes (Signed)
[facility] DEPT Provider Note   CSN: [id number] Arrival date & time: 12/09/19  1523     History Chief Complaint  Patient presents with  . Chest Pain    [place] Park is a 49 y.o. male.  HPI  HPI: A 49 year old patient with a history of hypertension, hypercholesterolemia and obesity presents for evaluation of chest pain. Initial onset of pain was more than 6 hours ago. The patient's chest pain is described as heaviness/pressure/tightness and is not worse with exertion. The patient's chest pain is middle- or left-sided, is not well-localized, is not sharp and does not radiate to the arms/jaw/neck. The patient does not complain of nausea and denies diaphoresis. The patient has smoked in the past 90 days. The patient has no history of stroke, has no history of peripheral artery disease, denies any history of treated diabetes and has no relevant family history of coronary artery disease (first degree relative at less than age 50).   Patient notes that his chest is described as pressure type pain that located across the lower part of the chest, in a bandlike manner.  It does not radiate to the neck, back, shoulder.  The pain is not exertional but it is worse with deep inspiration.  The pain is also worse when he lays flat.  Past Medical History:  Diagnosis Date  . PTSD (post-traumatic stress disorder)     Patient Active Problem List   Diagnosis Date Noted  . Pulmonary embolus ([place]) 12/09/2019    Past Surgical History:  Procedure Laterality Date  . ANTERIOR CRUCIATE LIGAMENT REPAIR    . FOOT SURGERY    . NOSE SURGERY         Family History  Family history unknown: Yes    Social History   Tobacco Use  . Smoking status: Current Some Day Smoker    Types: Cigarettes, Cigars  . Smokeless tobacco: Never Used  Vaping Use  . Vaping Use: Never used  Substance Use Topics  . Alcohol use: Yes  . Drug use: Never    Home Medications Prior to  Admission medications   Medication Sig Start Date End Date Taking? Authorizing Provider  albuterol (VENTOLIN HFA) 108 (90 Base) MCG/ACT inhaler Inhale 2 puffs into the lungs as needed for wheezing or shortness of breath. 12/03/16  Yes [provider]  ASPIRIN LOW DOSE 81 MG EC tablet Take 81 mg by mouth daily. 09/14/19  Yes [provider]  buPROPion (WELLBUTRIN XL) 150 MG 24 hr tablet Take 150 mg by mouth every morning. 07/17/19  Yes [provider]  gabapentin (NEURONTIN) 100 MG capsule Take 100 mg by mouth 3 (three) times daily.   Yes [provider]  lisinopril (ZESTRIL) 10 MG tablet Take 10 mg by mouth daily. 09/14/19  Yes [provider]  pantoprazole (PROTONIX) 40 MG tablet Take 40 mg by mouth daily. 09/15/19  Yes [provider]  rosuvastatin (CRESTOR) 10 MG tablet Take 10 mg by mouth daily. 09/14/19  Yes [provider]  valACYclovir (VALTREX) 500 MG tablet Take 500 mg by mouth daily. 11/05/19  Yes [provider]    Allergies    Patient has no known allergies.  Review of Systems   Review of Systems  Constitutional: Positive for activity change.  Respiratory: Positive for shortness of breath.   Cardiovascular: Positive for chest pain.  Gastrointestinal: Negative for nausea and vomiting.  Allergic/Immunologic: Negative for immunocompromised state.  All other systems reviewed and are negative.  Physical Exam Updated Vital Signs BP 130/88   Pulse 78   Temp 98.5 F (36.9 C) (Oral)   Resp 14   Ht 6' 2"  (1.88 m)   Wt 118.8 kg   SpO2 99%   BMI 33.64 kg/m   Physical Exam Vitals and nursing note reviewed.  Constitutional:      Appearance: He is well-developed.  HENT:     Head: Atraumatic.  Cardiovascular:     Rate and Rhythm: Normal rate.     Heart sounds: No friction rub.  Pulmonary:     Effort: Pulmonary effort is normal.  Musculoskeletal:     Cervical back: Neck supple.     Right lower leg: No edema.      Left lower leg: No edema.  Skin:    General: Skin is warm.  Neurological:     Mental Status: He is alert and oriented to person, place, and time.     ED Results / Procedures / Treatments   Labs (all labs ordered are listed, but only abnormal results are displayed) Labs Reviewed  BASIC METABOLIC PANEL - Abnormal; Notable for the following components:      Result Value   Glucose, Bld 105 (*)    Calcium 8.8 (*)    All other components within normal limits  D-DIMER, QUANTITATIVE (NOT AT [facility]) - Abnormal; Notable for the following components:   D-Dimer, Quant 2.79 (*)    All other components within normal limits  SARS CORONAVIRUS 2 BY RT PCR (HOSPITAL ORDER, [facility])  CBC  LACTATE DEHYDROGENASE  FERRITIN  TRIGLYCERIDES  FIBRINOGEN  C-REACTIVE PROTEIN  POC SARS CORONAVIRUS 2 AG -  ED  TROPONIN I (HIGH SENSITIVITY)  TROPONIN I (HIGH SENSITIVITY)    EKG EKG Interpretation  Date/Time:  Monday December 09 2019 15:34:53 EDT Ventricular Rate:  70 PR Interval:    QRS Duration: 86 QT Interval:  374 QTC Calculation: 404 R Axis:   71 Text Interpretation: Sinus rhythm [id number] No significant change since last tracing No acute changes Confirmed by Varney Biles [phone]) on 12/09/2019 4:58:41 PM   Radiology DG Chest 2 View  Result Date: 12/09/2019 CLINICAL DATA:  Chest discomfort since last night; patient states that the pain intensifies when he takes breath in, it feels like when he takes in very cold air. No known cardiopulmonary problems; occasional smoker x 15 years; HTN EXAM: CHEST - 2 VIEW COMPARISON:  04/27/2016 FINDINGS: Small area of opacity at the left lateral lung base best seen on the frontal view, new since the prior exam. Remainder of the lungs is clear. No pleural effusion or pneumothorax. Cardiac silhouette is normal in size. No mediastinal or hilar masses or evidence of adenopathy. Skeletal structures are intact. IMPRESSION: 1. Small area  of opacity at the left lateral lung base. This may reflect pneumonia or be due to atelectasis/scarring. No other evidence of acute cardiopulmonary disease. Electronically Signed   By: Lajean Manes M.D.   On: 12/09/2019 16:08   CT Angio Chest PE W and/or Wo Contrast  Result Date: 12/09/2019 CLINICAL DATA:  Chest pain.  Pleuritic. EXAM: CT ANGIOGRAPHY CHEST WITH CONTRAST TECHNIQUE: Multidetector CT imaging of the chest was performed using the standard protocol during bolus administration of intravenous contrast. Multiplanar CT image reconstructions and MIPs were obtained to evaluate the vascular anatomy. CONTRAST:  129m OMNIPAQUE IOHEXOL 350 MG/ML SOLN COMPARISON:  12/09/2019 plain film.  No prior CT. FINDINGS: Cardiovascular: The quality of this exam for evaluation  of pulmonary embolism is poor. In addition to motion, the bolus is suboptimally timed, contrast centered in the brachiocephalic veins and SVC. Suspicion of isolated anterior left upper lobe subsegmental pulmonary embolism, including on 86/5, coronal image 79. No central or large lobar pulmonary embolism identified. Normal aortic caliber. Borderline cardiomegaly, without pericardial effusion. Pulmonary artery enlargement, outflow tract 3.2 cm Mediastinum/Nodes: No mediastinal or hilar adenopathy. Lungs/Pleura: Trace bilateral pleural fluid. Mild motion degradation inferiorly. Peripheral predominant mild ground-glass opacities, worse on the left than right. Dense left lower lobe consolidation, including on 105/6. Upper Abdomen: Motion degradation continuing. Normal imaged portions of the liver, spleen, stomach, pancreas, gallbladder, adrenal glands, kidneys. Musculoskeletal: No acute osseous abnormality. Review of the MIP images confirms the above findings. IMPRESSION: 1. Poor quality exam for evaluation of pulmonary embolism. Suspicion of a subsegmental pulmonary embolism to the anterior left upper lobe. Of questionable clinical significance.  Otherwise, no evidence of pulmonary embolism to the large lobar level. 2. Subtle peripheral ground-glass opacities which are suspicious for mild COVID-19 pneumonia. 3. Dense consolidation in the left lung base which could represent a focus of infection or infarct if the patient has pulmonary emboli. As this is plain film visible, recommend radiographic follow-up to confirm resolution. 4. Trace bilateral pleural fluid. Electronically Signed   By: Abigail Miyamoto M.D.   On: 12/09/2019 18:10    Procedures .Critical Care Performed by: Varney Biles, MD Authorized by: Varney Biles, MD   Critical care provider statement:    Critical care time (minutes):  68   Critical care was time spent personally by me on the following activities:  Discussions with consultants, evaluation of patient's response to treatment, examination of patient, ordering and performing treatments and interventions, ordering and review of laboratory studies, ordering and review of radiographic studies, pulse oximetry, re-evaluation of patient's condition, obtaining history from patient or surrogate and review of old charts   (including critical care time)  Medications Ordered in ED Medications  heparin bolus via infusion 5,400 Units (has no administration in time range)  heparin ADULT infusion 100 units/mL (25000 units/261m sodium chloride 0.45%) (has no administration in time range)  iohexol (OMNIPAQUE) 350 MG/ML injection 100 mL (100 mLs Intravenous Contrast Given 12/09/19 1737)    ED Course  I have reviewed the triage vital signs and the nursing notes.  Pertinent labs & imaging results that were available during my care of the patient were reviewed by me and considered in my medical decision making (see chart for details).  Clinical Course as of Dec 08 2304  Mon Dec 09, 2019  1953 CT PE is equivocal. There is concern for pulmonary infarct, subsegmental PE, pneumonia, COVID-19.  Clinically, PE and pneumonia are higher  in the differential.  We will get the rapid COVID-19 antigen test and then decide.  CT Angio Chest PE W and/or Wo Contrast [AN]  2121 Rapid Covid is negative.  PCR Covid pending.  D-dimer also sent after discussion with e-link doctor.  Current plan is to treat patient for PE empirically if the D-dimer is elevated and covid is negative. If covid is positive and dimer is elevated, then we will likely  have shared decision making.  POC SARS Coronavirus 2 Ag-ED - Nasal Swab (BD Veritor Kit) [AN]  21287PCR Covid test for Covid is negative.  D-dimer is elevated at 2.79. We will treat patient has obvious PE.  Results discussed with the patient.  His Pasi score is low.  Patient unable to lay flat however, reporting  that he has severe discomfort when he does so.  He is not comfortable going home.  Although my suspicion for pericarditis is low, I think it might be worthwhile for patient to stay overnight and get an echocardiogram as well.  Additionally, patient does not have a PCP in town.  We will start him on heparin and admit him to medicine.  D-dimer, quantitative (not at [facility])(!) [AN]    Clinical Course User Index [AN] Varney Biles, MD   MDM Rules/Calculators/A&P HEAR Score: 53                        49 year old male with history of hypertension, hyperlipidemia comes in a chief complaint of chest pain.  The chest pain is atypical for ACS.  It was constantly present all night last night, and now he has pleuritic it appears that the discomfort is also worse when laying flat.  Differential diagnoses primarily includes pericarditis, PE.  ACS is lower in the differential.  Social history is unremarkable which is reassuring.  It does not appear that this is GERD.  Patient is vaccinated for COVID-19. He denies any cough.  He does not have any PE risk factors.  Chest x-ray showing opacity in the left lower lung base.  Patient continues to deny any focal left-sided discomfort, cough, fevers.  Will want to  CT angiogram to evaluate for pulmonary infarct/PE.  [place] was evaluated in Emergency Department on 12/09/2019 for the symptoms described in the history of present illness. He was evaluated in the context of the global COVID-19 pandemic, which necessitated consideration that the patient might be at risk for infection with the SARS-CoV-2 virus that causes COVID-19. Institutional protocols and algorithms that pertain to the evaluation of patients at risk for COVID-19 are in a state of rapid change based on information released by regulatory bodies including the CDC and federal and state organizations. These policies and algorithms were followed during the patient's care in the ED.   Final Clinical Impression(s) / ED Diagnoses Final diagnoses:  Other acute pulmonary embolism without acute cor pulmonale ([place])    Rx / DC Orders ED Discharge Orders    None       Varney Biles, MD 12/09/19 2306

## 2019-12-09 NOTE — ED Notes (Signed)
Admitting provider at bedside.

## 2019-12-09 NOTE — H&P (Signed)
History and Physical    Casey Park:[id number] DOB: 09-17-1970 DOA: 12/09/2019  PCP: System, Pcp Not In  Patient coming from: Home  I have personally briefly reviewed patient's old medical records in [place]  Chief Complaint: Pleuritic chest pain  HPI: Casey Park is a 49 y.o. male with medical history significant for HTN, HLD, PTSD/anxiety, TIA, GERD, Chiari I malformation who presents to the ED for evaluation of new onset pleuritic type chest pain.  Patient states he took a trip to [facility] this weekend.  Yesterday he went golfing and then was having some drinks with friends socially.  Last night he developed new onset of bandlike discomfort across his lower chest.  Pain was pressure-like.  He noticed that the discomfort was worse when he was laying down flat and improved with sitting up or when walking.  Pain also worse when taking deep breaths.  He says this was notably different than his GERD symptoms.  He has not had any cough.  He denies any radiation of the pain up to his neck, jaw, arms.  He has not had any subjective fevers, chills, diaphoresis, nausea, vomiting, abdominal pain, dysuria.  He does note observing some swelling in his left knee with pain/tightness at his left calf recently.  He denies any personal history of blood clots or known blood clots in immediate family.  He denies any recent surgeries.  He says he has been vaccinated against COVID-19 with [employer] vaccine, completed in April 2021.  He is a current smoker but has been tapering down with the use of Wellbutrin with plan to quit date 10 days from now.  He uses alcohol socially.  His previous medical care was in [place].  He moved to the local area 4 years ago but has not established with a primary care provider here.  ED Course:  Initial vitals showed BP 148/94, pulse 74, RR 16, temp 98.5 Fahrenheit, SPO2 98% on room air.  Initial labs showed sodium 139, potassium 4.0, bicarb 26, BUN 11,  creatinine 1.12, serum glucose 105, WBC 10.2, hemoglobin 13.8, platelets 168,000, high-sensitivity troponin I 3 and 4.  POC SARS-CoV-2 antigen was negative.  SARS-CoV-2 PCR was negative.  2 view chest x-ray showed a small area of opacity at the left lateral lung base.  CTA chest was read as a poor quality exam for evaluation of PE but there was suspicion of subsegmental PE to the anterior left upper lobe.  Subtle peripheral groundglass opacities were noted as well as dense consolidation in the left lung base which could represent focus of infection or infarct if PE present.  EDP discussed with pulmonology given indeterminate imaging findings.  They recommended obtaining D-dimer and if positive considering admission and starting anticoagulation.  D-dimer was obtained and elevated at 2.79.  Patient was started on IV heparin anticoagulation.  Given degree of discomfort and question of pulmonary infarct, the hospitalist service was consulted to admit for further evaluation and management.  Review of Systems: All systems reviewed and are negative except as documented in history of present illness above.   Past Medical History:  Diagnosis Date   GERD (gastroesophageal reflux disease)    Hyperlipidemia    Hypertension    PTSD (post-traumatic stress disorder)     Past Surgical History:  Procedure Laterality Date   ANTERIOR CRUCIATE LIGAMENT REPAIR     FOOT SURGERY     NOSE SURGERY      Social History:  reports that he  has been smoking cigarettes and cigars. He has never used smokeless tobacco. He reports current alcohol use. He reports that he does not use drugs.  No Known Allergies  Family History  Problem Relation Age of Onset   Stroke Father    Throat cancer Father      Prior to Admission medications   Medication Sig Start Date End Date Taking? Authorizing Provider  albuterol (VENTOLIN HFA) 108 (90 Base) MCG/ACT inhaler Inhale 2 puffs into the lungs as needed for  wheezing or shortness of breath. 12/03/16  Yes [provider]  ASPIRIN LOW DOSE 81 MG EC tablet Take 81 mg by mouth daily. 09/14/19  Yes [provider]  buPROPion (WELLBUTRIN XL) 150 MG 24 hr tablet Take 150 mg by mouth every morning. 07/17/19  Yes [provider]  gabapentin (NEURONTIN) 100 MG capsule Take 100 mg by mouth 3 (three) times daily.   Yes [provider]  lisinopril (ZESTRIL) 10 MG tablet Take 10 mg by mouth daily. 09/14/19  Yes [provider]  pantoprazole (PROTONIX) 40 MG tablet Take 40 mg by mouth daily. 09/15/19  Yes [provider]  rosuvastatin (CRESTOR) 10 MG tablet Take 10 mg by mouth daily. 09/14/19  Yes [provider]  valACYclovir (VALTREX) 500 MG tablet Take 500 mg by mouth daily. 11/05/19  Yes [provider]    Physical Exam: Vitals:   12/09/19 2215 12/09/19 2230 12/09/19 2252 12/09/19 2315  BP: 128/84 130/83 130/88 134/82  Pulse:   78 66  Resp: 18 16 14 16   Temp:      TempSrc:      SpO2:   99% 98%  Weight:      Height:       Constitutional: Sitting up in chair, NAD, calm, comfortable Eyes: PERRL, lids and conjunctivae normal ENMT: Mucous membranes are moist. Posterior pharynx clear of any exudate or lesions.Normal dentition.  Neck: normal, supple, no masses. Respiratory: clear to auscultation bilaterally, no wheezing, no crackles. Normal respiratory effort. No accessory muscle use.  Cardiovascular: Regular rate and rhythm, no murmurs / rubs / gallops. No extremity edema. 2+ pedal pulses. Abdomen: no tenderness, no masses palpated. No hepatosplenomegaly. Bowel sounds positive.  Musculoskeletal: no clubbing / cyanosis. No joint deformity upper and lower extremities. Good ROM, no contractures. Normal muscle tone.  Skin: no rashes, lesions, ulcers. No induration Neurologic: CN 2-12 grossly intact. Sensation intact, Strength 5/5 in all 4.  Psychiatric: Normal judgment and insight. Alert and  oriented x 3. Normal mood.     Labs on Admission: I have personally reviewed following labs and imaging studies  CBC: Recent Labs  Lab 12/09/19 1548  WBC 10.2  HGB 13.8  HCT 39.5  MCV 87.8  PLT 314   Basic Metabolic Panel: Recent Labs  Lab 12/09/19 1548  NA 139  K 4.0  CL 106  CO2 26  GLUCOSE 105*  BUN 11  CREATININE 1.12  CALCIUM 8.8*   GFR: Estimated Creatinine Clearance: 109.2 mL/min (by C-G formula based on SCr of 1.12 mg/dL). Liver Function Tests: No results for input(s): AST, ALT, ALKPHOS, BILITOT, PROT, ALBUMIN in the last 168 hours. No results for input(s): LIPASE, AMYLASE in the last 168 hours. No results for input(s): AMMONIA in the last 168 hours. Coagulation Profile: No results for input(s): INR, PROTIME in the last 168 hours. Cardiac Enzymes: No results for input(s): CKTOTAL, CKMB, CKMBINDEX, TROPONINI in the last 168 hours. BNP (last 3 results) No results for input(s): PROBNP in  the last 8760 hours. HbA1C: No results for input(s): HGBA1C in the last 72 hours. CBG: No results for input(s): GLUCAP in the last 168 hours. Lipid Profile: No results for input(s): CHOL, HDL, LDLCALC, TRIG, CHOLHDL, LDLDIRECT in the last 72 hours. Thyroid Function Tests: No results for input(s): TSH, T4TOTAL, FREET4, T3FREE, THYROIDAB in the last 72 hours. Anemia Panel: No results for input(s): VITAMINB12, FOLATE, FERRITIN, TIBC, IRON, RETICCTPCT in the last 72 hours. Urine analysis: No results found for: COLORURINE, APPEARANCEUR, LABSPEC, PHURINE, GLUCOSEU, HGBUR, BILIRUBINUR, KETONESUR, PROTEINUR, UROBILINOGEN, NITRITE, LEUKOCYTESUR  Radiological Exams on Admission: DG Chest 2 View  Result Date: 12/09/2019 CLINICAL DATA:  Chest discomfort since last night; patient states that the pain intensifies when he takes breath in, it feels like when he takes in very cold air. No known cardiopulmonary problems; occasional smoker x 15 years; HTN EXAM: CHEST - 2 VIEW COMPARISON:   04/27/2016 FINDINGS: Small area of opacity at the left lateral lung base best seen on the frontal view, new since the prior exam. Remainder of the lungs is clear. No pleural effusion or pneumothorax. Cardiac silhouette is normal in size. No mediastinal or hilar masses or evidence of adenopathy. Skeletal structures are intact. IMPRESSION: 1. Small area of opacity at the left lateral lung base. This may reflect pneumonia or be due to atelectasis/scarring. No other evidence of acute cardiopulmonary disease. Electronically Signed   By: Lajean Manes M.D.   On: 12/09/2019 16:08   CT Angio Chest PE W and/or Wo Contrast  Result Date: 12/09/2019 CLINICAL DATA:  Chest pain.  Pleuritic. EXAM: CT ANGIOGRAPHY CHEST WITH CONTRAST TECHNIQUE: Multidetector CT imaging of the chest was performed using the standard protocol during bolus administration of intravenous contrast. Multiplanar CT image reconstructions and MIPs were obtained to evaluate the vascular anatomy. CONTRAST:  175mL OMNIPAQUE IOHEXOL 350 MG/ML SOLN COMPARISON:  12/09/2019 plain film.  No prior CT. FINDINGS: Cardiovascular: The quality of this exam for evaluation of pulmonary embolism is poor. In addition to motion, the bolus is suboptimally timed, contrast centered in the brachiocephalic veins and SVC. Suspicion of isolated anterior left upper lobe subsegmental pulmonary embolism, including on 86/5, coronal image 79. No central or large lobar pulmonary embolism identified. Normal aortic caliber. Borderline cardiomegaly, without pericardial effusion. Pulmonary artery enlargement, outflow tract 3.2 cm Mediastinum/Nodes: No mediastinal or hilar adenopathy. Lungs/Pleura: Trace bilateral pleural fluid. Mild motion degradation inferiorly. Peripheral predominant mild ground-glass opacities, worse on the left than right. Dense left lower lobe consolidation, including on 105/6. Upper Abdomen: Motion degradation continuing. Normal imaged portions of the liver, spleen,  stomach, pancreas, gallbladder, adrenal glands, kidneys. Musculoskeletal: No acute osseous abnormality. Review of the MIP images confirms the above findings. IMPRESSION: 1. Poor quality exam for evaluation of pulmonary embolism. Suspicion of a subsegmental pulmonary embolism to the anterior left upper lobe. Of questionable clinical significance. Otherwise, no evidence of pulmonary embolism to the large lobar level. 2. Subtle peripheral ground-glass opacities which are suspicious for mild COVID-19 pneumonia. 3. Dense consolidation in the left lung base which could represent a focus of infection or infarct if the patient has pulmonary emboli. As this is plain film visible, recommend radiographic follow-up to confirm resolution. 4. Trace bilateral pleural fluid. Electronically Signed   By: Abigail Miyamoto M.D.   On: 12/09/2019 18:10    EKG: Independently reviewed. Normal sinus rhythm, S1Q3T3 pattern noted.  Prior EKG showed sinus arrhythmia with similar S1Q3T3 pattern.  Assessment/Plan Principal Problem:   Pulmonary embolus (HCC) Active Problems:  Essential hypertension   Hyperlipidemia   History of TIA (transient ischemic attack)   PTSD (post-traumatic stress disorder)   Suspected PE with question of left lung base pulmonary infarct: Patient with pleuritic type chest pain.  CTA chest PE study is indeterminant but given clinical picture there is suspicion of left-sided PE with possible pulmonary infarct.  Currently no evidence of infectious pneumonia.  SARS-CoV-2 antigen and PCR both negative.  Patient states he has been fully vaccinated against Covid with the [place] vaccine completed in April 2021.  He is a current smoker and has had recent travel drive to [facility].  No known family history of blood clots.  No recent surgeries.  Has not had any obvious bleeding.  He has been started on anticoagulation Pulmonology recommendations. -Continue IV heparin anticoagulation for now, can transition to [place]  on discharge -Will obtain echocardiogram and lower extremity Dopplers -Supplemental oxygen as needed  Hypertension: Stable.  Continue lisinopril.  Hyperlipidemia: Continue rosuvastatin.  History of TIA: Continue aspirin and rosuvastatin.  GERD: Continue pantoprazole.  PTSD/anxiety: Continue Wellbutrin.  Tobacco use: Patient has been decreasing amount he is smoking with the assistance of Wellbutrin.  He is motivated to quit immediately.  Continue Wellbutrin.  DVT prophylaxis: Heparin anticoagulation Code Status: Full code, confirmed with patient Family Communication: Discussed with patient, he has discussed with family Disposition Plan: From home and likely discharge to home in 1 day pending echo, lower extremity Dopplers Consults called: EDP discussed with on-call pulmonology Admission status:  Status is: Observation  The patient remains OBS appropriate and will d/c before 2 midnights.  Dispo: The patient is from: Home              Anticipated d/c is to: Home              Anticipated d/c date is: 1 day              Patient currently is not medically stable to d/c.  Zada Finders MD [facility]  If 7PM-7AM, please contact night-coverage [email or web address]  12/09/2019, 11:58 PM

## 2019-12-09 NOTE — Progress Notes (Signed)
[facility] Progress Note Patient Name: Casey Park DOB: 1970/10/27 MRN: [id number]   Date of Service  12/09/2019  HPI/Events of Note  Dr Kathrynn Humble wanted a Pulmonary opinion. CTPA: done for left pleuritic pain. Q what to do next.  49 yr old male with Left pleuritic pain for a day. No cough or fever.  CTPA film seen. Showing left > right sub pleural based mild air space densities. Can not rule out segmental PE.  No major PE.   eICU Interventions  Suspicious for Covid, but no hypoxemia or cough.  Get D dimer, if negative, no need for Anti coagulation. If +, consider admission and AC and follow up Covid PCR testing/markers.      Intervention Category Intermediate Interventions: Communication with other healthcare providers and/or family  [name] 12/09/2019, 9:15 PM

## 2019-12-09 NOTE — ED Triage Notes (Signed)
Patient reports that he began having chest pain last night and was worse when he was lying. Patient states when he takes a deep breath he has discomfort in his lungs also. Patient denies any coughing, headache, sore throat, or body aches.

## 2019-12-10 ENCOUNTER — Observation Stay (HOSPITAL_BASED_OUTPATIENT_CLINIC_OR_DEPARTMENT_OTHER)

## 2019-12-10 DIAGNOSIS — I2693 Single subsegmental pulmonary embolism without acute cor pulmonale: Secondary | ICD-10-CM

## 2019-12-10 DIAGNOSIS — I824Z1 Acute embolism and thrombosis of unspecified deep veins of right distal lower extremity: Secondary | ICD-10-CM | POA: Diagnosis present

## 2019-12-10 DIAGNOSIS — I2699 Other pulmonary embolism without acute cor pulmonale: Secondary | ICD-10-CM | POA: Diagnosis present

## 2019-12-10 DIAGNOSIS — E785 Hyperlipidemia, unspecified: Secondary | ICD-10-CM

## 2019-12-10 DIAGNOSIS — Z8673 Personal history of transient ischemic attack (TIA), and cerebral infarction without residual deficits: Secondary | ICD-10-CM | POA: Diagnosis not present

## 2019-12-10 DIAGNOSIS — I10 Essential (primary) hypertension: Secondary | ICD-10-CM

## 2019-12-10 LAB — LACTATE DEHYDROGENASE: LDH: 227 U/L — ABNORMAL HIGH (ref 98–192)

## 2019-12-10 LAB — ECHOCARDIOGRAM COMPLETE
Area-P 1/2: 3.12 cm2
Height: 74 in
S' Lateral: 3.1 cm
Weight: 4192 oz

## 2019-12-10 LAB — BASIC METABOLIC PANEL
Anion gap: 9 (ref 5–15)
BUN: 10 mg/dL (ref 6–20)
CO2: 24 mmol/L (ref 22–32)
Calcium: 8.4 mg/dL — ABNORMAL LOW (ref 8.9–10.3)
Chloride: 107 mmol/L (ref 98–111)
Creatinine, Ser: 1.02 mg/dL (ref 0.61–1.24)
GFR calc Af Amer: >60 mL/min (ref >60)
GFR calc non Af Amer: >60 mL/min (ref >60)
Glucose, Bld: 101 mg/dL — ABNORMAL HIGH (ref 70–99)
Potassium: 4.6 mmol/L (ref 3.5–5.1)
Sodium: 140 mmol/L (ref 135–145)

## 2019-12-10 LAB — FERRITIN: Ferritin: 182 ng/mL (ref 24–336)

## 2019-12-10 LAB — CBC
HCT: 39 % (ref 39.0–52.0)
Hemoglobin: 13.4 g/dL (ref 13.0–17.0)
MCH: 30.7 pg (ref 26.0–34.0)
MCHC: 34.4 g/dL (ref 30.0–36.0)
MCV: 89.2 fL (ref 80.0–100.0)
Platelets: 175 10*3/uL (ref 150–400)
RBC: 4.37 MIL/uL (ref 4.22–5.81)
RDW: 13.2 % (ref 11.5–15.5)
WBC: 8.6 10*3/uL (ref 4.0–10.5)
nRBC: 0 % (ref 0.0–0.2)

## 2019-12-10 LAB — HEPARIN LEVEL (UNFRACTIONATED): Heparin Unfractionated: 0.67 IU/mL (ref 0.30–0.70)

## 2019-12-10 LAB — C-REACTIVE PROTEIN: CRP: 4.8 mg/dL — ABNORMAL HIGH (ref <1.0)

## 2019-12-10 LAB — BRAIN NATRIURETIC PEPTIDE: B Natriuretic Peptide: 15.3 pg/mL (ref 0.0–100.0)

## 2019-12-10 LAB — HIV ANTIBODY (ROUTINE TESTING W REFLEX): HIV Screen 4th Generation wRfx: NONREACTIVE

## 2019-12-10 LAB — PROCALCITONIN: Procalcitonin: <0.1 ng/mL

## 2019-12-10 LAB — SEDIMENTATION RATE: Sed Rate: 26 mm/hr — ABNORMAL HIGH (ref 0–16)

## 2019-12-10 LAB — TRIGLYCERIDES: Triglycerides: 26 mg/dL (ref <150)

## 2019-12-10 LAB — FIBRINOGEN: Fibrinogen: 504 mg/dL — ABNORMAL HIGH (ref 210–475)

## 2019-12-10 MED ORDER — APIXABAN 5 MG PO TABS: ORAL_TABLET | ORAL | 1 refills | Status: DC

## 2019-12-10 MED ORDER — APIXABAN 5 MG PO TABS: 5.0000 mg | ORAL_TABLET | Freq: Two times a day (BID) | ORAL | Status: DC

## 2019-12-10 MED ORDER — APIXABAN 5 MG PO TABS
10.0000 mg | ORAL_TABLET | Freq: Two times a day (BID) | ORAL | Status: DC
  Administered 2019-12-10: 10 mg via ORAL
  Filled 2019-12-10: qty 2

## 2019-12-10 NOTE — Discharge Instructions (Signed)

## 2019-12-10 NOTE — Progress Notes (Signed)
  Echocardiogram 2D Echocardiogram has been performed.  Casey Park 12/10/2019, 8:52 AM

## 2019-12-10 NOTE — Discharge Summary (Signed)
Physician Discharge Summary  Casey Park YBW:[id number] DOB: [birth year] DOA: 12/09/2019  PCP: System, Pcp Not In  Admit date: 12/09/2019 Discharge date: 12/10/2019  Admitted From: home Disposition:  home  Recommendations for Outpatient Follow-up:  1. Follow up with PCP in 1-2 weeks 2. Please obtain BMP/CBC in one week 3. Patient will follow up with pulmonology for follow up of PE and GGO on CT  Discharge Condition:stable CODE STATUS:full code Diet recommendation: heart healthy  Brief/Interim Summary: Casey Park is a 49 y.o. male with medical history significant for HTN, HLD, PTSD/anxiety, TIA, GERD, Chiari I malformation who presents to the ED for evaluation of new onset pleuritic type chest pain.  Patient states he took a trip to [facility] this weekend.  Yesterday he went golfing and then was having some drinks with friends socially.  Last night he developed new onset of bandlike discomfort across his lower chest.  Pain was pressure-like.  He noticed that the discomfort was worse when he was laying down flat and improved with sitting up or when walking.  Pain also worse when taking deep breaths.  He says this was notably different than his GERD symptoms.  He has not had any cough.  He denies any radiation of the pain up to his neck, jaw, arms.  He has not had any subjective fevers, chills, diaphoresis, nausea, vomiting, abdominal pain, dysuria.  He does note observing some swelling in his left knee with pain/tightness at his left calf recently.  He denies any personal history of blood clots or known blood clots in immediate family.  He denies any recent surgeries.  He says he has been vaccinated against COVID-19 with [employer] vaccine, completed in April 2021.  He is a current smoker but has been tapering down with the use of Wellbutrin with plan to quit date 10 days from now.  He uses alcohol socially.  His previous medical care was in [place].  He moved to the local area 4  years ago but has not established with a primary care provider here.  ED Course:  Initial vitals showed BP 148/94, pulse 74, RR 16, temp 98.5 Fahrenheit, SPO2 98% on room air.  Initial labs showed sodium 139, potassium 4.0, bicarb 26, BUN 11, creatinine 1.12, serum glucose 105, WBC 10.2, hemoglobin 13.8, platelets 168,000, high-sensitivity troponin I 3 and 4.  POC SARS-CoV-2 antigen was negative.  SARS-CoV-2 PCR was negative.  2 view chest x-ray showed a small area of opacity at the left lateral lung base.  CTA chest was read as a poor quality exam for evaluation of PE but there was suspicion of subsegmental PE to the anterior left upper lobe.  Subtle peripheral groundglass opacities were noted as well as dense consolidation in the left lung base which could represent focus of infection or infarct if PE present.  EDP discussed with pulmonology given indeterminate imaging findings.  They recommended obtaining D-dimer and if positive considering admission and starting anticoagulation.  D-dimer was obtained and elevated at 2.79.  Patient was started on IV heparin anticoagulation.  Given degree of discomfort and question of pulmonary infarct, the hospitalist service was consulted to admit for further evaluation and management.  Discharge Diagnoses:  Principal Problem:   Pulmonary embolus (HCC) Active Problems:   Essential hypertension   Hyperlipidemia   History of TIA (transient ischemic attack)   PTSD (post-traumatic stress disorder)   Lower leg DVT (deep venous thromboembolism), acute, right ([place])   Pulmonary infarct ([place])  1. Acute  pulmonary embolus with left lung base pulmonary infarct and associated right lower extremity DVT.  Patient noted to have suspected PE on CT imaging.  D-dimer also noted to be elevated.  Venous Dopplers positive for DVT right lower extremity.  Patient was started on anticoagulation with heparin and subsequently transitioned to Eliquis.  He was seen by  pulmonology and was not felt that antibiotics are indicated at this time for pulmonary infarct unless he develops fever.  He was encouraged to practice pulmonary hygiene and incentive spirometry.  Patient is not hypoxic at this time.  He is able to ambulate. 2. Bilateral groundglass opacities.  Unclear etiology.  Plans are to follow-up with pulmonology as an outpatient for further management. 3. GERD.  Continue PPI 4. Hyperlipidemia.  Continue statin 5. Hypertension.  Continue lisinopril. 6. Tobacco use.  Counseled on importance of tobacco cessation.  Continue on Wellbutrin.  Case discussed with Dr. Vaughan Browner and it was felt appropriate to discharge patient home today with outpatient pulmonology follow-up.  Discharge Instructions  Discharge Instructions    Diet - low sodium heart healthy   Complete by: As directed    Increase activity slowly   Complete by: As directed      Allergies as of 12/10/2019   No Known Allergies     Medication List    STOP taking these medications   Aspirin Low Dose 81 MG EC tablet Generic drug: aspirin     TAKE these medications   albuterol 108 (90 Base) MCG/ACT inhaler Commonly known as: VENTOLIN HFA Inhale 2 puffs into the lungs as needed for wheezing or shortness of breath.   apixaban 5 MG Tabs tablet Commonly known as: Eliquis Take 2 tablets (10mg ) twice daily for 7 days, then 1 tablet (5mg ) twice daily   buPROPion 150 MG 24 hr tablet Commonly known as: WELLBUTRIN XL Take 150 mg by mouth every morning.   gabapentin 100 MG capsule Commonly known as: NEURONTIN Take 100 mg by mouth 3 (three) times daily.   lisinopril 10 MG tablet Commonly known as: ZESTRIL Take 10 mg by mouth daily.   pantoprazole 40 MG tablet Commonly known as: PROTONIX Take 40 mg by mouth daily.   rosuvastatin 10 MG tablet Commonly known as: CRESTOR Take 10 mg by mouth daily.   valACYclovir 500 MG tablet Commonly known as: VALTREX Take 500 mg by mouth daily.        Follow-up Information    Marshell Garfinkel, MD Follow up.   Specialty: Pulmonary Disease Why: office will call you with an appointment Contact information: [address] [place] [id number] [phone]              No Known Allergies  Consultations:  Pulmonology, Dr. Vaughan Browner   Procedures/Studies: DG Chest 2 View  Result Date: 12/09/2019 CLINICAL DATA:  Chest discomfort since last night; patient states that the pain intensifies when he takes breath in, it feels like when he takes in very cold air. No known cardiopulmonary problems; occasional smoker x 15 years; HTN EXAM: CHEST - 2 VIEW COMPARISON:  04/27/2016 FINDINGS: Small area of opacity at the left lateral lung base best seen on the frontal view, new since the prior exam. Remainder of the lungs is clear. No pleural effusion or pneumothorax. Cardiac silhouette is normal in size. No mediastinal or hilar masses or evidence of adenopathy. Skeletal structures are intact. IMPRESSION: 1. Small area of opacity at the left lateral lung base. This may reflect pneumonia or be due  to atelectasis/scarring. No other evidence of acute cardiopulmonary disease. Electronically Signed   By: Lajean Manes M.D.   On: 12/09/2019 16:08   CT Angio Chest PE W and/or Wo Contrast  Result Date: 12/09/2019 CLINICAL DATA:  Chest pain.  Pleuritic. EXAM: CT ANGIOGRAPHY CHEST WITH CONTRAST TECHNIQUE: Multidetector CT imaging of the chest was performed using the standard protocol during bolus administration of intravenous contrast. Multiplanar CT image reconstructions and MIPs were obtained to evaluate the vascular anatomy. CONTRAST:  113mL OMNIPAQUE IOHEXOL 350 MG/ML SOLN COMPARISON:  12/09/2019 plain film.  No prior CT. FINDINGS: Cardiovascular: The quality of this exam for evaluation of pulmonary embolism is poor. In addition to motion, the bolus is suboptimally timed, contrast centered in the brachiocephalic veins and SVC. Suspicion of isolated  anterior left upper lobe subsegmental pulmonary embolism, including on 86/5, coronal image 79. No central or large lobar pulmonary embolism identified. Normal aortic caliber. Borderline cardiomegaly, without pericardial effusion. Pulmonary artery enlargement, outflow tract 3.2 cm Mediastinum/Nodes: No mediastinal or hilar adenopathy. Lungs/Pleura: Trace bilateral pleural fluid. Mild motion degradation inferiorly. Peripheral predominant mild ground-glass opacities, worse on the left than right. Dense left lower lobe consolidation, including on 105/6. Upper Abdomen: Motion degradation continuing. Normal imaged portions of the liver, spleen, stomach, pancreas, gallbladder, adrenal glands, kidneys. Musculoskeletal: No acute osseous abnormality. Review of the MIP images confirms the above findings. IMPRESSION: 1. Poor quality exam for evaluation of pulmonary embolism. Suspicion of a subsegmental pulmonary embolism to the anterior left upper lobe. Of questionable clinical significance. Otherwise, no evidence of pulmonary embolism to the large lobar level. 2. Subtle peripheral ground-glass opacities which are suspicious for mild COVID-19 pneumonia. 3. Dense consolidation in the left lung base which could represent a focus of infection or infarct if the patient has pulmonary emboli. As this is plain film visible, recommend radiographic follow-up to confirm resolution. 4. Trace bilateral pleural fluid. Electronically Signed   By: Abigail Miyamoto M.D.   On: 12/09/2019 18:10   ECHOCARDIOGRAM COMPLETE  Result Date: 12/10/2019    ECHOCARDIOGRAM REPORT   Patient Name:   Casey Park Date of Exam: 12/10/2019 Medical Rec #:  [id number]      Height:       74.0 in Accession #:    [id number]     Weight:       262.0 lb Date of Birth:  [birth year]      BSA:          2.438 m Patient Age:    11 years       BP:           120/85 mmHg Patient Gender: M              HR:           73 bpm. Exam Location:  Inpatient Procedure: 2D Echo Indications:     pulmonary embolus 415.19  History:        Patient has no prior history of Echocardiogram examinations.                 Risk Factors:Hypertension, Dyslipidemia and Current Smoker.  Sonographer:    Jannett Celestine RDCS (AE) Referring Phys: [id number] VISHAL R PATEL IMPRESSIONS  1. Left ventricular ejection fraction, by estimation, is 60 to 65%. The left ventricle has normal function. The left ventricle has no regional wall motion abnormalities. There is mild concentric left ventricular hypertrophy. Left ventricular diastolic parameters were normal.  2. Right ventricular systolic function is normal. The right  ventricular size is moderately enlarged. Tricuspid regurgitation signal is inadequate for assessing PA pressure.  3. The mitral valve is normal in structure. No evidence of mitral valve regurgitation. No evidence of mitral stenosis.  4. The aortic valve is normal in structure. Aortic valve regurgitation is not visualized. No aortic stenosis is present.  5. Aortic dilatation noted. There is mild dilatation of the aortic root measuring 41 mm.  6. The inferior vena cava is dilated in size with <50% respiratory variability, suggesting right atrial pressure of 15 mmHg. FINDINGS  Left Ventricle: Left ventricular ejection fraction, by estimation, is 60 to 65%. The left ventricle has normal function. The left ventricle has no regional wall motion abnormalities. The left ventricular internal cavity size was normal in size. There is  mild concentric left ventricular hypertrophy. Left ventricular diastolic parameters were normal. Normal left ventricular filling pressure. Right Ventricle: The right ventricular size is moderately enlarged. No increase in right ventricular wall thickness. Right ventricular systolic function is normal. Tricuspid regurgitation signal is inadequate for assessing PA pressure. Left Atrium: Left atrial size was normal in size. Right Atrium: Right atrial size was normal in size. Pericardium: There is no  evidence of pericardial effusion. Mitral Valve: The mitral valve is normal in structure. Normal mobility of the mitral valve leaflets. No evidence of mitral valve regurgitation. No evidence of mitral valve stenosis. Tricuspid Valve: The tricuspid valve is normal in structure. Tricuspid valve regurgitation is not demonstrated. No evidence of tricuspid stenosis. Aortic Valve: The aortic valve is normal in structure. Aortic valve regurgitation is not visualized. No aortic stenosis is present. Pulmonic Valve: The pulmonic valve was normal in structure. Pulmonic valve regurgitation is not visualized. No evidence of pulmonic stenosis. Aorta: Aortic dilatation noted. There is mild dilatation of the aortic root measuring 41 mm. Venous: The inferior vena cava is dilated in size with less than 50% respiratory variability, suggesting right atrial pressure of 15 mmHg. IAS/Shunts: No atrial level shunt detected by color flow Doppler.  LEFT VENTRICLE PLAX 2D LVIDd:         4.40 cm  Diastology LVIDs:         3.10 cm  LV e' lateral:   15.30 cm/s LV PW:         1.20 cm  LV E/e' lateral: 4.1 LV IVS:        1.20 cm  LV e' medial:    9.36 cm/s LVOT diam:     2.40 cm  LV E/e' medial:  6.6 LV SV:         83 LV SV Index:   34 LVOT Area:     4.52 cm  RIGHT VENTRICLE TAPSE (M-mode): 1.8 cm LEFT ATRIUM             Index       RIGHT ATRIUM           Index LA diam:        3.80 cm 1.56 cm/m  RA Area:     23.30 cm LA Vol (A2C):   73.8 ml 30.27 ml/m RA Volume:   75.10 ml  30.81 ml/m LA Vol (A4C):   27.4 ml 11.24 ml/m LA Biplane Vol: 46.3 ml 18.99 ml/m  AORTIC VALVE LVOT Vmax:   84.20 cm/s LVOT Vmean:  61.300 cm/s LVOT VTI:    0.184 m  AORTA Ao Root diam: 4.10 cm MITRAL VALVE MV Area (PHT): 3.12 cm    SHUNTS MV Decel Time: 243 msec    Systemic  VTI:  0.18 m MV E velocity: 62.10 cm/s  Systemic Diam: 2.40 cm MV A velocity: 55.70 cm/s MV E/A ratio:  1.11 Fransico Him MD Electronically signed by Fransico Him MD Signature Date/Time:  12/10/2019/9:22:57 AM    Final    VAS [place] LOWER EXTREMITY VENOUS (DVT)  Result Date: 12/10/2019  Lower Venous DVTStudy Indications: Pulmonary embolism, and recent travel.  Comparison Study: No prior study Performing Technologist: Maudry Mayhew MHA, RDMS, RVT, RDCS  Examination Guidelines: A complete evaluation includes B-mode imaging, spectral Doppler, color Doppler, and power Doppler as needed of all accessible portions of each vessel. Bilateral testing is considered an integral part of a complete examination. Limited examinations for reoccurring indications may be performed as noted. The reflux portion of the exam is performed with the patient in reverse Trendelenburg.  +---------+---------------+---------+-----------+----------+--------------+ RIGHT    CompressibilityPhasicitySpontaneityPropertiesThrombus Aging +---------+---------------+---------+-----------+----------+--------------+ CFV      Full           Yes      Yes                                 +---------+---------------+---------+-----------+----------+--------------+ SFJ      Full                                                        +---------+---------------+---------+-----------+----------+--------------+ FV Prox  Full                                                        +---------+---------------+---------+-----------+----------+--------------+ FV Mid   Full                                                        +---------+---------------+---------+-----------+----------+--------------+ FV DistalFull                                                        +---------+---------------+---------+-----------+----------+--------------+ PFV      Full                                                        +---------+---------------+---------+-----------+----------+--------------+ POP      Full           Yes      Yes                                  +---------+---------------+---------+-----------+----------+--------------+ PTV      Full                                                        +---------+---------------+---------+-----------+----------+--------------+  PERO     Full                                                        +---------+---------------+---------+-----------+----------+--------------+ Gastroc  None                    No                   Acute          +---------+---------------+---------+-----------+----------+--------------+   +---------+---------------+---------+-----------+----------+--------------+ LEFT     CompressibilityPhasicitySpontaneityPropertiesThrombus Aging +---------+---------------+---------+-----------+----------+--------------+ CFV      Full           Yes      Yes                                 +---------+---------------+---------+-----------+----------+--------------+ SFJ      Full                                                        +---------+---------------+---------+-----------+----------+--------------+ FV Prox  Full                                                        +---------+---------------+---------+-----------+----------+--------------+ FV Mid   Full                                                        +---------+---------------+---------+-----------+----------+--------------+ FV DistalFull                                                        +---------+---------------+---------+-----------+----------+--------------+ PFV      Full                                                        +---------+---------------+---------+-----------+----------+--------------+ POP      Full           Yes      Yes                                 +---------+---------------+---------+-----------+----------+--------------+ PTV      Full                                                         +---------+---------------+---------+-----------+----------+--------------+  PERO     Full                                                        +---------+---------------+---------+-----------+----------+--------------+     Summary: RIGHT: - Findings consistent with acute deep vein thrombosis involving the right gastrocnemius veins. - No cystic structure found in the popliteal fossa.  LEFT: - There is no evidence of deep vein thrombosis in the lower extremity.  - No cystic structure found in the popliteal fossa.  *See table(s) above for measurements and observations. Electronically signed by Deitra Mayo MD on 12/10/2019 at 4:04:29 PM.    Final       Subjective:   Discharge Exam: Vitals:   12/10/19 1330 12/10/19 1440 12/10/19 1530 12/10/19 1630  BP: 125/81 125/81 125/81 125/81  Pulse: 66 72 72 70  Resp: 14 (!) 23 (!) 23 (!) 23  Temp:      TempSrc:      SpO2: 99% 96% 99% 99%  Weight:      Height:        General: Pt is alert, awake, not in acute distress Cardiovascular: RRR, S1/S2 +, no rubs, no gallops Respiratory: CTA bilaterally, no wheezing, no rhonchi Abdominal: Soft, NT, ND, bowel sounds + Extremities: no edema, no cyanosis    The results of significant diagnostics from this hospitalization (including imaging, microbiology, ancillary and laboratory) are listed below for reference.     Microbiology: Recent Results (from the past 240 hour(s))  SARS Coronavirus 2 by RT PCR (hospital order, performed in [facility] hospital lab) Nasopharyngeal Nasopharyngeal Swab     Status: None   Collection Time: 12/09/19  8:34 PM   Specimen: Nasopharyngeal Swab  Result Value Ref Range Status   SARS Coronavirus 2 NEGATIVE NEGATIVE Final    Comment: (NOTE) SARS-CoV-2 target nucleic acids are NOT DETECTED.  The SARS-CoV-2 RNA is generally detectable in upper and lower respiratory specimens during the acute phase of infection. The lowest concentration of SARS-CoV-2 viral copies  this assay can detect is 250 copies / mL. A negative result does not preclude SARS-CoV-2 infection and should not be used as the sole basis for treatment or other patient management decisions.  A negative result may occur with improper specimen collection / handling, submission of specimen other than nasopharyngeal swab, presence of viral mutation(s) within the areas targeted by this assay, and inadequate number of viral copies (<250 copies / mL). A negative result must be combined with clinical observations, patient history, and epidemiological information.  Fact Sheet for Patients:   StrictlyIdeas.no  Fact Sheet for Healthcare Providers: BankingDealers.co.za  This test is not yet approved or  cleared by the [place] FDA and has been authorized for detection and/or diagnosis of SARS-CoV-2 by FDA under an Emergency Use Authorization (EUA).  This EUA will remain in effect (meaning this test can be used) for the duration of the COVID-19 declaration under Section 564(b)(1) of the Act, 21 U.S.C. section 360bbb-3(b)(1), unless the authorization is terminated or revoked sooner.  Performed at [facility], [place] [address] [place], [place] [address]      Labs: BNP (last 3 results) No results for input(s): BNP in the last 8760 hours. Basic Metabolic Panel: Recent Labs  Lab 12/09/19 1548 12/10/19 0604  NA 139 140  K 4.0 4.6  CL 106 107  CO2 26 24  GLUCOSE 105* 101*  BUN 11 10  CREATININE 1.12 1.02  CALCIUM 8.8* 8.4*   Liver Function Tests: No results for input(s): AST, ALT, ALKPHOS, BILITOT, PROT, ALBUMIN in the last 168 hours. No results for input(s): LIPASE, AMYLASE in the last 168 hours. No results for input(s): AMMONIA in the last 168 hours. CBC: Recent Labs  Lab 12/09/19 1548 12/10/19 0604  WBC 10.2 8.6  HGB 13.8 13.4  HCT 39.5 39.0  MCV 87.8 89.2  PLT 168 175   Cardiac Enzymes: No results  for input(s): CKTOTAL, CKMB, CKMBINDEX, TROPONINI in the last 168 hours. BNP: Invalid input(s): POCBNP CBG: No results for input(s): GLUCAP in the last 168 hours. D-Dimer Recent Labs    12/09/19 2128  DDIMER 2.79*   Hgb A1c No results for input(s): HGBA1C in the last 72 hours. Lipid Profile Recent Labs    12/09/19 2301  TRIG 26   Thyroid function studies No results for input(s): TSH, T4TOTAL, T3FREE, THYROIDAB in the last 72 hours.  Invalid input(s): FREET3 Anemia work up [employer]    12/09/19 2354  FERRITIN 182   Urinalysis No results found for: COLORURINE, APPEARANCEUR, LABSPEC, [place], GLUCOSEU, [place], [place], [place], PROTEINUR, UROBILINOGEN, NITRITE, LEUKOCYTESUR Sepsis Labs Invalid input(s): PROCALCITONIN,  WBC,  LACTICIDVEN Microbiology Recent Results (from the past 240 hour(s))  SARS Coronavirus 2 by RT PCR (hospital order, performed in [facility] hospital lab) Nasopharyngeal Nasopharyngeal Swab     Status: None   Collection Time: 12/09/19  8:34 PM   Specimen: Nasopharyngeal Swab  Result Value Ref Range Status   SARS Coronavirus 2 NEGATIVE NEGATIVE Final    Comment: (NOTE) SARS-CoV-2 target nucleic acids are NOT DETECTED.  The SARS-CoV-2 RNA is generally detectable in upper and lower respiratory specimens during the acute phase of infection. The lowest concentration of SARS-CoV-2 viral copies this assay can detect is 250 copies / mL. A negative result does not preclude SARS-CoV-2 infection and should not be used as the sole basis for treatment or other patient management decisions.  A negative result may occur with improper specimen collection / handling, submission of specimen other than nasopharyngeal swab, presence of viral mutation(s) within the areas targeted by this assay, and inadequate number of viral copies (<250 copies / mL). A negative result must be combined with clinical observations, patient history, and epidemiological  information.  Fact Sheet for Patients:   StrictlyIdeas.no  Fact Sheet for Healthcare Providers: BankingDealers.co.za  This test is not yet approved or  cleared by the [place] FDA and has been authorized for detection and/or diagnosis of SARS-CoV-2 by FDA under an Emergency Use Authorization (EUA).  This EUA will remain in effect (meaning this test can be used) for the duration of the COVID-19 declaration under Section 564(b)(1) of the Act, 21 U.S.C. section 360bbb-3(b)(1), unless the authorization is terminated or revoked sooner.  Performed at [facility], [place] [address], [place], [place] [address]      Time coordinating discharge: 58mins  SIGNED:   Kathie Dike, MD  [facility] 12/10/2019, 5:26 PM   If 7PM-7AM, please contact night-coverage [email or web address]

## 2019-12-10 NOTE — Consult Note (Addendum)
NAME:  Casey Park, MRN:  [id number], DOB:  27-Jan-1971, LOS: 0 ADMISSION DATE:  12/09/2019, CONSULTATION DATE:  9/7 REFERRING MD:  Roderic Palau, CHIEF COMPLAINT:  Abnormal CT chest    Brief History   49 year old male admitted w/ pleuritic type CP 9/6. CT imaging of poor quality but did raise concern for subsegmental left anterior lobe PE as well as possible Left base infarct. Pulm asked to see.   History of present illness   49 year old male who presented to ED w/ cc: band-like pleuritic chest pain first noted the PM hours of 9/5. No cough, fever, chills, sick exposures or recent illness. Did travel to [facility] the weekend of 9/3. Described the pressure as worse when lying down, better w/ sitting up or walking. Did have recent swelling in the left knee w/ tightness and swelling in the left lower leg.  In ED a CT chest was obtained. It was poor quality but raised concern for subsegmental PE in the left anterior lobe, subtle gg airspace disease and dense consolidation on left lung base.  He was admitted w/ working dx of PE w/ possible pulmonary infarct. Pulmonary asked to evaluate    Past Medical History  HTN, HLD< PTSD/anxiety, GERD, Chiari I malformation, TIA.  Had his COVID-19 vax April 2021 Active smoker. Working on quitting  [facility]   9/6 admitted 9/7 pccm consulted   Consults:  pulm   Procedures:   Significant Diagnostic Tests:   CT chest 9/6: 1. Poor quality exam for evaluation of pulmonary embolism. Suspicion of a subsegmental pulmonary embolism to the anterior left upper lobe. Of questionable clinical significance. Otherwise, no evidence of pulmonary embolism to the large lobar level. 2. Subtle peripheral ground-glass opacities which are suspicious for mild COVID-19 pneumonia. 3. Dense consolidation in the left lung base which could represent a focus of infection or infarct if the patient has pulmonary emboli. As this is plain film visible, recommend  radiographic follow-up to confirm resolution.4. Trace bilateral pleural fluid Echo 9/7: 1. Left ventricular ejection fraction, by estimation, is 60 to 65%. The left ventricle has normal function. The left ventricle has no regional wall motion abnormalities. There is mild concentric left ventricular  hypertrophy. Left ventricular diastolic parameters were normal. 2. Right ventricular systolic function is normal. The right ventricular size is moderately enlarged. Tricuspid regurgitation signal is inadequate for assessing PA pressure.  3. The mitral valve is normal in structure. No evidence of mitral valve regurgitation. No evidence of mitral stenosis.  [place] 9/6>>  Micro Data:    Antimicrobials:    Interim history/subjective:    Objective   Blood pressure 124/85, pulse 64, temperature 98.5 F (36.9 C), temperature source Oral, resp. rate 16, height 6\' 2"  (1.88 m), weight 118.8 kg, SpO2 97 %.       No intake or output data in the 24 hours ending 12/10/19 1254 Filed Weights   12/09/19 1537  Weight: 118.8 kg    Examination: General: otherwise healthy appearing 49 year old male in no  HENT: NCAT no JVD  Lungs: clear no accessory use  Cardiovascular: RRR Abdomen: soft  Extremities: warm and dry  Neuro: awake and oriented  GU: voids  Resolved Hospital Problem list     Assessment & Plan:   DVT w/ what looks like pulmonary infarct at left lung base.  -this would explain his discomfort as well -provoking factor would be recent travel  Plan Await LE [place] Cont systemic anticoagulation will  need at least 3 months of therapy; agree reasonable to transition to [place] at dc Re: possible infarct would repeat CXR in am; some times these can become infected so need to watch for fever, cough or worsening sob/return of chest pain  Will get him a f/u appointment in our office after dc for f/u cxr.   Bilateral ground glass pulm infiltrates.  Etiology not clear. Has h/o sig reflux and this has been  a chronic but also was fairly significant this weekend Plan Checking BNP Check sed rate Ck PCT Will get esophagram  Dr Vaughan Browner to follow   Pleuritic type chest pain. Reproducible any time he is in supine position -I am worried about reflux Plan Esophagram   Best practice:  Per primary   Labs   CBC: Recent Labs  Lab 12/09/19 1548 12/10/19 0604  WBC 10.2 8.6  HGB 13.8 13.4  HCT 39.5 39.0  MCV 87.8 89.2  PLT 168 664    Basic Metabolic Panel: Recent Labs  Lab 12/09/19 1548 12/10/19 0604  NA 139 140  K 4.0 4.6  CL 106 107  CO2 26 24  GLUCOSE 105* 101*  BUN 11 10  CREATININE 1.12 1.02  CALCIUM 8.8* 8.4*   GFR: Estimated Creatinine Clearance: 119.9 mL/min (by C-G formula based on SCr of 1.02 mg/dL). Recent Labs  Lab 12/09/19 1548 12/10/19 0604  WBC 10.2 8.6    Liver Function Tests: No results for input(s): AST, ALT, ALKPHOS, BILITOT, PROT, ALBUMIN in the last 168 hours. No results for input(s): LIPASE, AMYLASE in the last 168 hours. No results for input(s): AMMONIA in the last 168 hours.  ABG No results found for: PHART, PCO2ART, PO2ART, HCO3, TCO2, ACIDBASEDEF, O2SAT   Coagulation Profile: No results for input(s): INR, PROTIME in the last 168 hours.  Cardiac Enzymes: No results for input(s): CKTOTAL, CKMB, CKMBINDEX, TROPONINI in the last 168 hours.  HbA1C: No results found for: HGBA1C  CBG: No results for input(s): GLUCAP in the last 168 hours.  Review of Systems:   Review of Systems  Constitutional: Negative for chills, fever and malaise/fatigue.  HENT: Negative.   Eyes: Negative.   Respiratory: Positive for shortness of breath.   Cardiovascular: Positive for chest pain and leg swelling.  Gastrointestinal: Positive for heartburn.  Musculoskeletal: Negative.   Skin: Negative.   Neurological: Negative.   Endo/Heme/Allergies: Negative.   Psychiatric/Behavioral: Negative.      Past Medical History  He,  has a past medical history of GERD  (gastroesophageal reflux disease), Hyperlipidemia, Hypertension, and PTSD (post-traumatic stress disorder).   Surgical History    Past Surgical History:  Procedure Laterality Date   ANTERIOR CRUCIATE LIGAMENT REPAIR     FOOT SURGERY     NOSE SURGERY       Social History   reports that he has been smoking cigarettes and cigars. He has never used smokeless tobacco. He reports current alcohol use. He reports that he does not use drugs.   Family History   His family history includes Stroke in his father; Throat cancer in his father.   Allergies No Known Allergies   Home Medications  Prior to Admission medications   Medication Sig Start Date End Date Taking? Authorizing Provider  albuterol (VENTOLIN HFA) 108 (90 Base) MCG/ACT inhaler Inhale 2 puffs into the lungs as needed for wheezing or shortness of breath. 12/03/16  Yes [provider]  ASPIRIN LOW DOSE 81 MG EC tablet Take 81 mg by mouth daily. 09/14/19  Yes  [provider]  buPROPion (WELLBUTRIN XL) 150 MG 24 hr tablet Take 150 mg by mouth every morning. 07/17/19  Yes [provider]  gabapentin (NEURONTIN) 100 MG capsule Take 100 mg by mouth 3 (three) times daily.   Yes [provider]  lisinopril (ZESTRIL) 10 MG tablet Take 10 mg by mouth daily. 09/14/19  Yes [provider]  pantoprazole (PROTONIX) 40 MG tablet Take 40 mg by mouth daily. 09/15/19  Yes [provider]  rosuvastatin (CRESTOR) 10 MG tablet Take 10 mg by mouth daily. 09/14/19  Yes [provider]  valACYclovir (VALTREX) 500 MG tablet Take 500 mg by mouth daily. 11/05/19  Yes [provider]     Critical care time: na    Erick Colace ACNP-BC [place] Pager # [phone] OR # [phone] if no answer

## 2019-12-10 NOTE — Progress Notes (Signed)
ANTICOAGULATION CONSULT NOTE -  Consult  Pharmacy Consult for IV heparin Indication: pulmonary embolus  No Known Allergies  Patient Measurements: Height: 6\' 2"  (188 cm) Weight: 118.8 kg (262 lb) IBW/kg (Calculated) : 82.2 Heparin Dosing Weight: 107.4 kg   Vital Signs: BP: 102/74 (09/07 0600) Pulse Rate: 62 (09/07 0600)  Labs: Recent Labs    12/09/19 1548 12/09/19 1804 12/10/19 0604  HGB 13.8  --  13.4  HCT 39.5  --  39.0  PLT 168  --  175  HEPARINUNFRC  --   --  0.67  CREATININE 1.12  --  1.02  TROPONINIHS 3 4  --     Estimated Creatinine Clearance: 119.9 mL/min (by C-G formula based on SCr of 1.02 mg/dL).   Medical History: Past Medical History:  Diagnosis Date  . GERD (gastroesophageal reflux disease)   . Hyperlipidemia   . Hypertension   . PTSD (post-traumatic stress disorder)     Medications:  Scheduled:  . aspirin EC  81 mg Oral Daily  . buPROPion  150 mg Oral q morning - 10a  . gabapentin  100 mg Oral TID  . lisinopril  10 mg Oral Daily  . pantoprazole  40 mg Oral Daily  . rosuvastatin  10 mg Oral Daily  . sodium chloride flush  3 mL Intravenous Q12H    Assessment: Pharmacy is consulted to dose heparin in 49 yo male diagnosed with PE with a question of left lung base pulmonary infarct. CTA chest PE study is indeterminant but given clinical picture there is suspicion of left-sided PE with possible pulmonary infarct.    Today, 12/10/19  Scr 1.02 mg/dl  Hgb 13.4, plt 175  HL 0.67, therapeutic  No line or bleeding issues per RN       Goal of Therapy:  Heparin level 0.3-0.7 units/ml Monitor platelets by anticoagulation protocol: Yes   Plan:   Continue heparin IV 1700 units/hr  Obtain confirmatory HL 6 hours    Daily CBC, HL while on heparin  Monitor clinical course, renal function, cultures as available  Royetta Asal, PharmD, BCPS 12/10/2019 6:31 AM

## 2019-12-10 NOTE — Progress Notes (Signed)
ANTICOAGULATION CONSULT NOTE - Initial Consult  Pharmacy Consult for IV heparin Indication: pulmonary embolus  No Known Allergies  Patient Measurements: Height: 6\' 2"  (188 cm) Weight: 118.8 kg (262 lb) IBW/kg (Calculated) : 82.2 Heparin Dosing Weight: 107.4 kg   Vital Signs: Temp: 98.5 F (36.9 C) (09/06 1535) Temp Source: Oral (09/06 1535) BP: 125/86 (09/07 0019) Pulse Rate: 76 (09/07 0019)  Labs: Recent Labs    12/09/19 1548 12/09/19 1804  HGB 13.8  --   HCT 39.5  --   PLT 168  --   CREATININE 1.12  --   TROPONINIHS 3 4    Estimated Creatinine Clearance: 109.2 mL/min (by C-G formula based on SCr of 1.12 mg/dL).   Medical History: Past Medical History:  Diagnosis Date  . GERD (gastroesophageal reflux disease)   . Hyperlipidemia   . Hypertension   . PTSD (post-traumatic stress disorder)     Medications:  Scheduled:  . aspirin EC  81 mg Oral Daily  . buPROPion  150 mg Oral q morning - 10a  . gabapentin  100 mg Oral TID  . lisinopril  10 mg Oral Daily  . pantoprazole  40 mg Oral Daily  . rosuvastatin  10 mg Oral Daily  . sodium chloride flush  3 mL Intravenous Q12H    Assessment: Pharmacy is consulted to dose heparin in 49 yo male diagnosed with PE with a question of left lung base pulmonary infarct. CTA chest PE study is indeterminant but given clinical picture there is suspicion of left-sided PE with possible pulmonary infarct.    Today, 12/10/19  Scr 1.12 mg/dl  Hgb 13.8, plt 168  D-dimer 2.79      Goal of Therapy:  Heparin level 0.3-0.7 units/ml Monitor platelets by anticoagulation protocol: Yes   Plan:   Heparin 5400 IV bolus, then 1700 units/hr  Obtain HL 6 hours after start of heparin infusion   Daily CBC, HL while on heparin  Monitor clinical course, renal function, cultures as available  Royetta Asal, PharmD, BCPS 12/10/2019 12:51 AM

## 2019-12-10 NOTE — Progress Notes (Signed)
ANTICOAGULATION CONSULT NOTE - Follow Up Consult  Pharmacy Consult for Apixaban Indication: pulmonary embolus  No Known Allergies  Patient Measurements: Height: 6\' 2"  (188 cm) Weight: 118.8 kg (262 lb) IBW/kg (Calculated) : 82.2 Heparin Dosing Weight: 107 kg  Vital Signs: BP: 122/86 (09/07 1057) Pulse Rate: 79 (09/07 1057)  Labs: Recent Labs    12/09/19 1548 12/09/19 1804 12/10/19 0604  HGB 13.8  --  13.4  HCT 39.5  --  39.0  PLT 168  --  175  HEPARINUNFRC  --   --  0.67  CREATININE 1.12  --  1.02  TROPONINIHS 3 4  --     Estimated Creatinine Clearance: 119.9 mL/min (by C-G formula based on SCr of 1.02 mg/dL).   Medications:  Infusions:  . heparin 1,700 Units/hr (12/10/19 1057)    Assessment: 49 yo male diagnosed with PE with a question of left lung base pulmonary infarct. CTA chest PE study is indeterminant but given clinical picture there is suspicion of left-sided PE with possible pulmonary infarct.  Pharmacy was initially consulted to dose Heparin IV, and now consulted to transition to apixaban.  No PTA anticoagulation  Today, 12/10/2019: Heparin level last therapeutic at 0.67 on 9/7 AM.  Heparin level due at noon was drawn, but insufficient for lab.  Re-draw is still pending at 16:00. CBC: Hgb and Plt WNL/stable No bleeding, complications reported D-dimer 2.79 (9/6) Dopplers + RLE DVT SCr 1.02, CrCl > 100 ml/min  Goal of Therapy:  Heparin level 0.3-0.7 units/ml Monitor platelets by anticoagulation protocol: Yes   Plan:  D/C Heparin Apixaban 10 mg PO BID x7 days, followed by 5mg  PO BID  Continue to monitor CBC, s/s bleeding Pharmacy to provide education prior to discharge.  Gretta Arab PharmD, BCPS Clinical Pharmacist [facility] main pharmacy [phone] 12/10/2019 11:33 AM

## 2019-12-10 NOTE — Progress Notes (Signed)
Bilateral lower extremity venous duplex completed. Refer to "CV Proc" under chart review to view preliminary results.  Preliminary results discussed with Marni Griffon.  12/10/2019 2:31 PM Kelby Aline., MHA, RVT, RDCS, RDMS

## 2019-12-11 ENCOUNTER — Telehealth: Payer: Self-pay | Admitting: Acute Care

## 2019-12-11 NOTE — Telephone Encounter (Signed)
-----   Message from [facility], [place] sent at 12/10/2019  4:18 PM EDT ----- Regarding: FW: follow up  ----- Message ----- From: Erick Colace, NP Sent: 12/10/2019   2:50 PM EDT To: [facility] Triage Pool Subject: follow up                                      [place] team   Will you please set this man up with a follow up in about 7-8 days. Place it in the computer Could be NP or MD...which ever. Needs two view cxr first. Thanks!  Laurey Arrow

## 2019-12-11 NOTE — Telephone Encounter (Signed)
Spoke with patient regarding prior message.Pateint stated he has been having pain with breathing . Pain sale is 9-10 with standing and pain scale with sitting is a 4. Advised to go to the ED to get evaluated . Patient's voice was understanding nothing else further needed.

## 2019-12-11 NOTE — Telephone Encounter (Signed)
Patient calling back concerned about left sided pain and discomfort.  Wanted to make sure if this is normal, suggestions.  Please advise.  [phone]

## 2019-12-11 NOTE — Telephone Encounter (Signed)
Called patient to schedule f/u - scheduled appt with [name] on 12/17/2019. After scheduling patient had several questions. He would like to know about the Esophagus imaging he was supposed to have - he states that he was to have that and a CT scan done prior to leaving the hospital and it wasn't done. He also states Laurey Arrow discussed possible lung scarring and now he is is experiencing the pain that [place] described. Ideally he would like to have Laurey Arrow give him a call at [phone]

## 2019-12-11 NOTE — Telephone Encounter (Signed)
Spoke with the pt  He states that the pain that he has is very minimal and he feels that this can wait until ov 12/17/19 to address  It is not new and he is not having any SOB with this  He is now asking if we take his new insurance, [employer]  Will forward to Edgar to ask this, as I was not completely sure, thanks

## 2019-12-17 ENCOUNTER — Other Ambulatory Visit: Payer: Self-pay

## 2019-12-17 ENCOUNTER — Ambulatory Visit (INDEPENDENT_AMBULATORY_CARE_PROVIDER_SITE_OTHER)

## 2019-12-17 ENCOUNTER — Encounter: Payer: Self-pay | Admitting: Primary Care

## 2019-12-17 ENCOUNTER — Ambulatory Visit (INDEPENDENT_AMBULATORY_CARE_PROVIDER_SITE_OTHER): Admitting: Primary Care

## 2019-12-17 VITALS — BP 118/80 | HR 94 | Temp 97.6°F | Ht 72.0 in | Wt 253.2 lb

## 2019-12-17 DIAGNOSIS — I824Z1 Acute embolism and thrombosis of unspecified deep veins of right distal lower extremity: Secondary | ICD-10-CM | POA: Diagnosis not present

## 2019-12-17 DIAGNOSIS — I2699 Other pulmonary embolism without acute cor pulmonale: Secondary | ICD-10-CM | POA: Diagnosis not present

## 2019-12-17 DIAGNOSIS — R918 Other nonspecific abnormal finding of lung field: Secondary | ICD-10-CM

## 2019-12-17 IMAGING — DX DG CHEST 2V
2 series · 2 of 2 positions shown · non-contrast
Comparison: [DATE]

CLINICAL DATA: F/U LLL opacity, pulmonary infarct, history of
hypertension

EXAM:
CHEST - 2 VIEW

[chest pa]
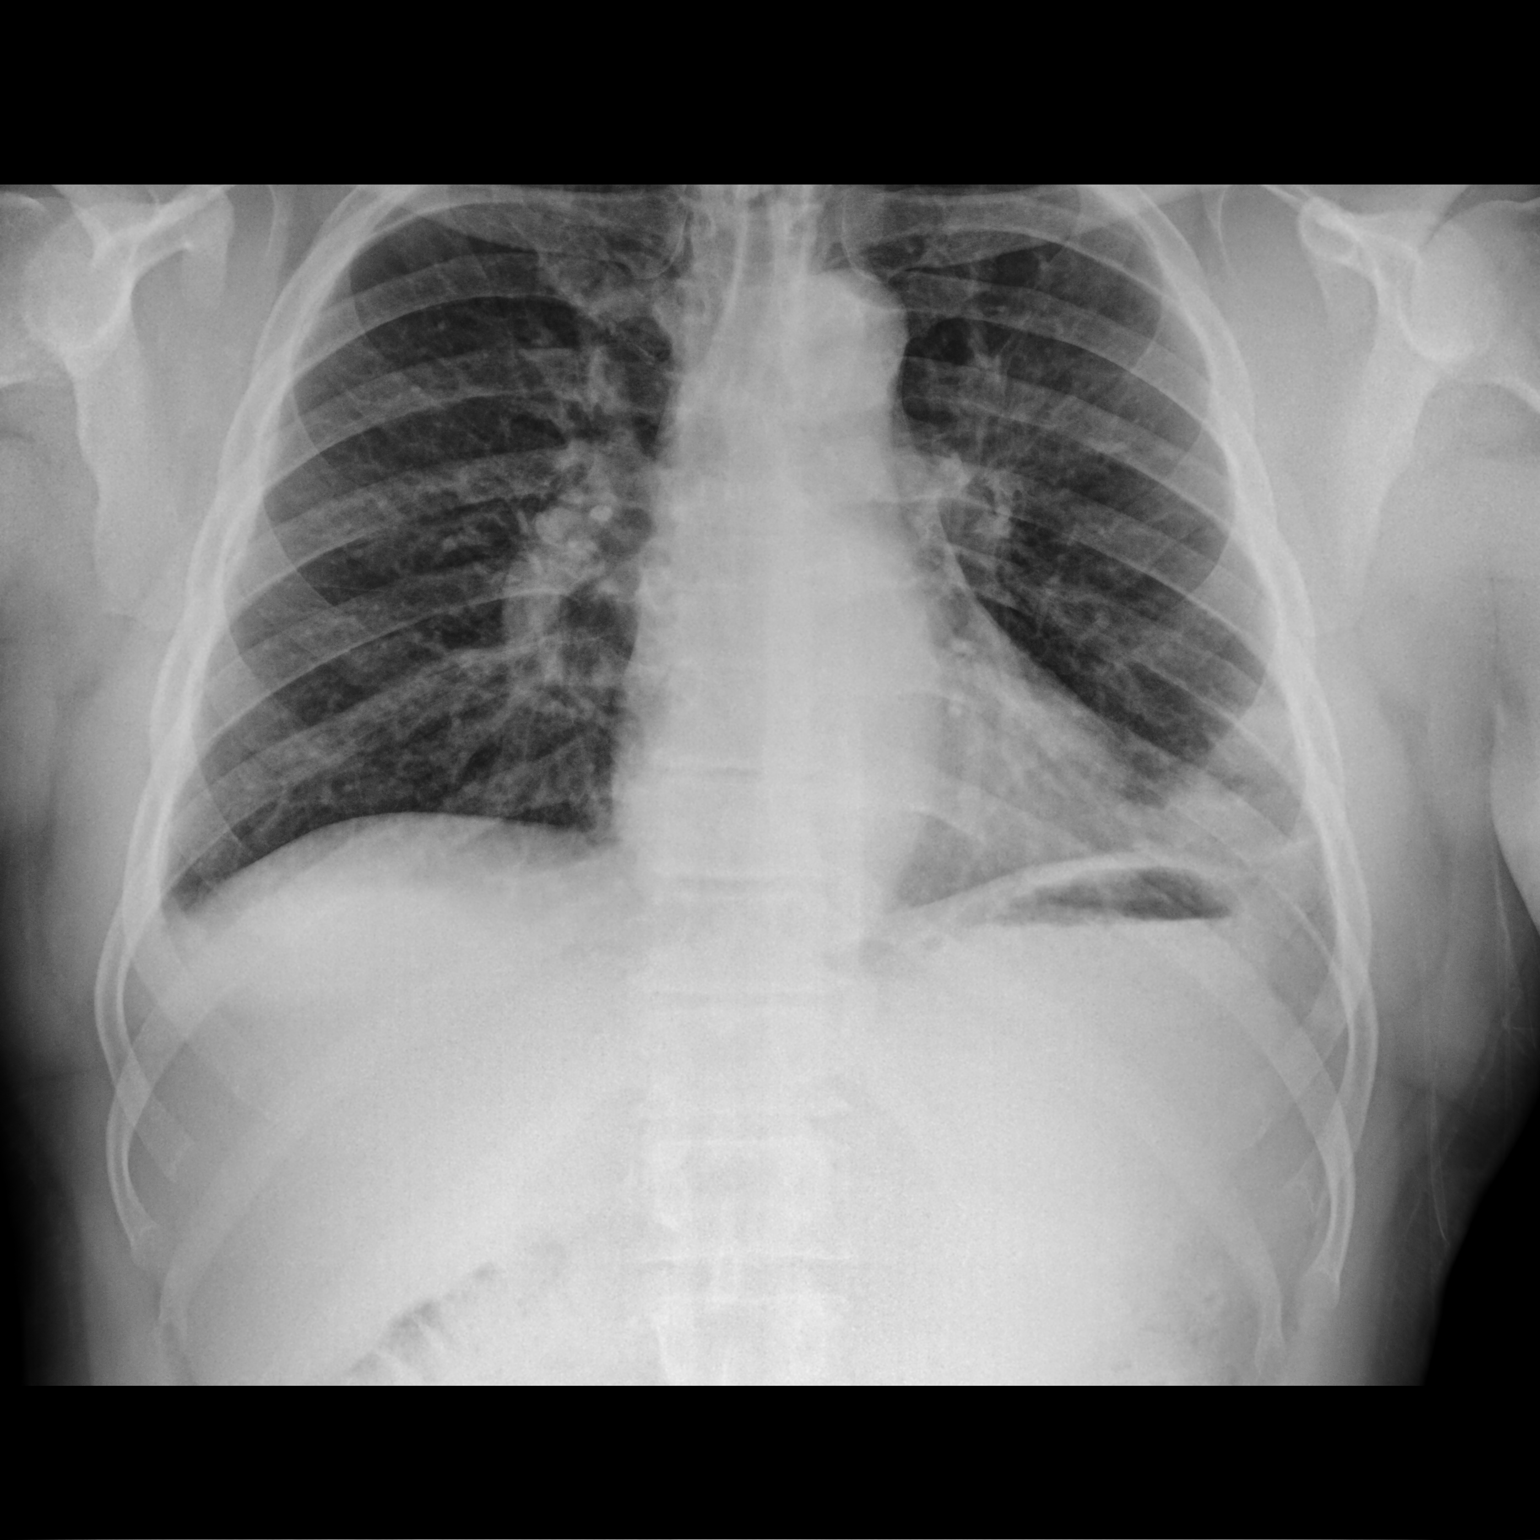

[chest lat]
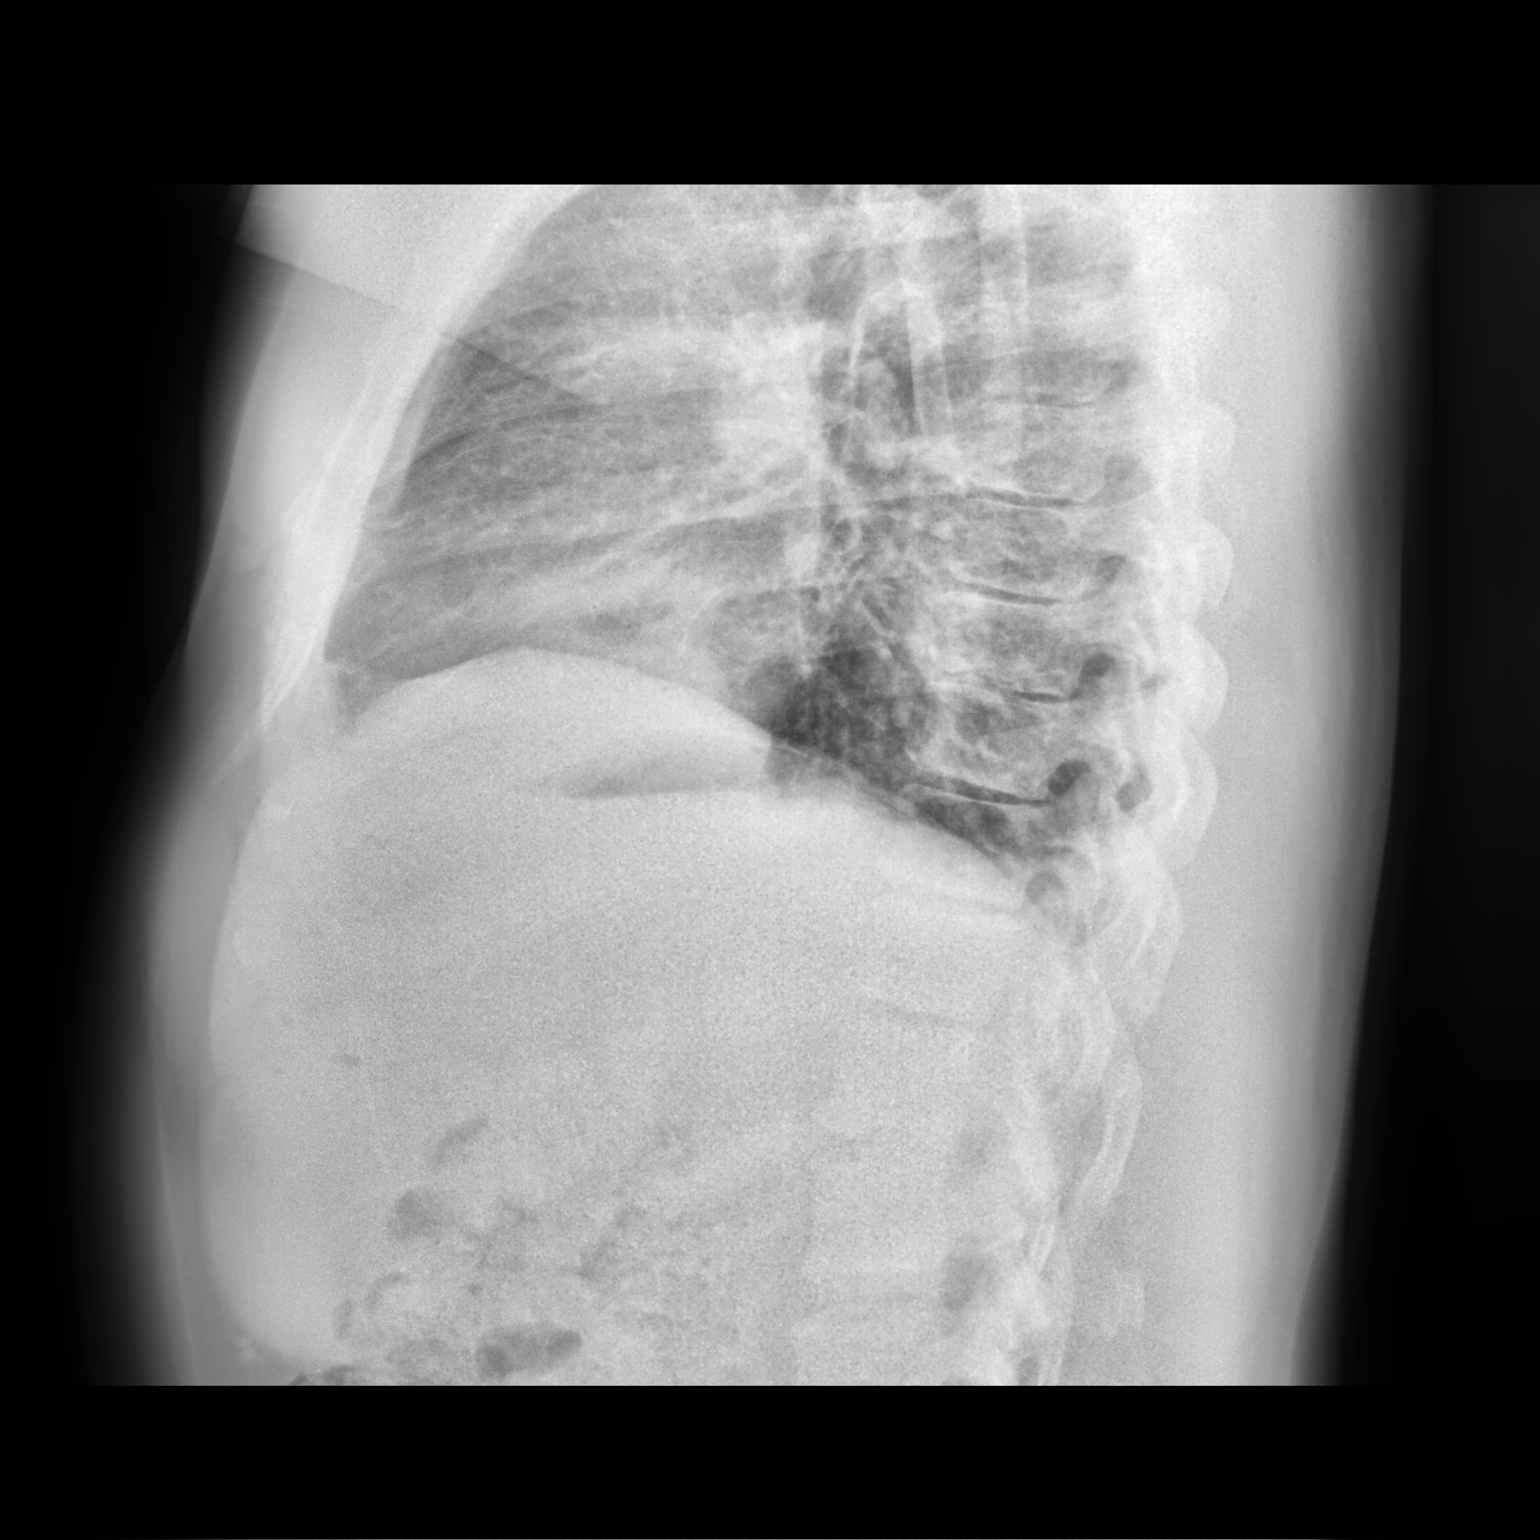

[2 of 2 positions shown; findings below may reference images not displayed]

FINDINGS: Normal heart size, mediastinal contours, and pulmonary vascularity.

Persistent LEFT basilar infiltrate, increased.

No additional infiltrate, pleural effusion, or pneumothorax.

Osseous structures unremarkable.
IMPRESSION: Increased LEFT basilar infiltrate.

## 2019-12-17 MED ORDER — TRAMADOL HCL 50 MG PO TABS: ORAL_TABLET | ORAL | 0 refills | Status: AC

## 2019-12-17 NOTE — Assessment & Plan Note (Addendum)
-   Stable. O2 100% RA today. Patient is having moderate posterior right sided back pain. Advised against taking Motrin with blood thinner. Rx tramadol 25-50 q 6 hours x 5 days for pain relief, alternating with Tylenol - CTA showed suspicion subsegmental PE to the anterior left upper lobe. Subtle peripheral ground glass opacities suspicious for mild covid-19 pneumonia. - He will needs systemic anticoagulation for at least 3 months then transition to [place] after discontinue

## 2019-12-17 NOTE — Patient Instructions (Addendum)
Testing:  CXR showed Persistent LEFT basilar infiltrate, increased. No additional infiltrate, pleural effusion, or pneumothorax.  Recommendations: - Continue Eliquis 5mg  twice daily  - Use incetive spirometry three times a day   Rx: - Tramadol 1 tab every 6 hours for back pain x 5 days; alternating with Tylenol  - Do not take NSAIDS/Ibuprofen with Blood thinner d/t risk for GI bleed/ulcer   Referral: - Hematology re: PE/DVT  Orders: - Esophagram re: reflux/ possible aspiration (ordered) - CTA w/wo contrast re: PE (ordered)  FU: - 3 weeks with Dr. Vaughan Browner     Pulmonary Embolism  A pulmonary embolism (PE) is a sudden blockage or decrease of blood flow in one or both lungs. Most blockages come from a blood clot that forms in the vein of a lower leg, thigh, or arm (deep vein thrombosis, DVT) and travels to the lungs. A clot is blood that has thickened into a gel or solid. PE is a dangerous and life-threatening condition that needs to be treated right away. What are the causes? This condition is usually caused by a blood clot that forms in a vein and moves to the lungs. In rare cases, it may be caused by air, fat, part of a tumor, or other tissue that moves through the veins and into the lungs. What increases the risk? The following factors may make you more likely to develop this condition:  Experiencing a traumatic injury, such as breaking a hip or leg.  Having: ? A spinal cord injury. ? Orthopedic surgery, especially hip or knee replacement. ? Any major surgery. ? A stroke. ? DVT. ? Blood clots or blood clotting disease. ? Long-term (chronic) lung or heart disease. ? Cancer treated with chemotherapy. ? A central venous catheter.  Taking medicines that contain estrogen. These include birth control pills and hormone replacement therapy.  Being: ? Pregnant. ? In the period of time after your baby is delivered (postpartum). ? Older than age 72. ? Overweight. ? A smoker,  especially if you have other risks. What are the signs or symptoms? Symptoms of this condition usually start suddenly and include:  Shortness of breath during activity or at rest.  Coughing, coughing up blood, or coughing up blood-tinged mucus.  Chest pain that is often worse with deep breaths.  Rapid or irregular heartbeat.  Feeling light-headed or dizzy.  Fainting.  Feeling anxious.  Fever.  Sweating.  Pain and swelling in a leg. This is a symptom of DVT, which can lead to PE. How is this diagnosed? This condition may be diagnosed based on:  Your medical history.  A physical exam.  Blood tests.  CT pulmonary angiogram. This test checks blood flow in and around your lungs.  Ventilation-perfusion scan, also called a lung VQ scan. This test measures air flow and blood flow to the lungs.  An ultrasound of the legs. How is this treated? Treatment for this condition depends on many factors, such as the cause of your PE, your risk for bleeding or developing more clots, and other medical conditions you have. Treatment aims to remove, dissolve, or stop blood clots from forming or growing larger. Treatment may include:  Medicines, such as: ? Blood thinning medicines (anticoagulants) to stop clots from forming. ? Medicines that dissolve clots (thrombolytics).  Procedures, such as: ? Using a flexible tube to remove a blood clot (embolectomy) or to deliver medicine to destroy it (catheter-directed thrombolysis). ? Inserting a filter into a large vein that carries blood to the heart (  inferior vena cava). This filter (vena cava filter) catches blood clots before they reach the lungs. ? Surgery to remove the clot (surgical embolectomy). This is rare. You may need a combination of immediate, long-term (up to 3 months after diagnosis), and extended (more than 3 months after diagnosis) treatments. Your treatment may continue for several months (maintenance therapy). You and your health  care provider will work together to choose the treatment program that is best for you. Follow these instructions at home: Medicines  Take over-the-counter and prescription medicines only as told by your health care provider.  If you are taking an anticoagulant medicine: ? Take the medicine every day at the same time each day. ? Understand what foods and drugs interact with your medicine. ? Understand the side effects of this medicine, including excessive bruising or bleeding. Ask your health care provider or pharmacist about other side effects. General instructions  Wear a medical alert bracelet or carry a medical alert card that says you have had a PE and lists what medicines you take.  Ask your health care provider when you may return to your normal activities. Avoid sitting or lying for a long time without moving.  Maintain a healthy weight. Ask your health care provider what weight is healthy for you.  Do not use any products that contain nicotine or tobacco, such as cigarettes, e-cigarettes, and chewing tobacco. If you need help quitting, ask your health care provider.  Talk with your health care provider about any travel plans. It is important to make sure that you are still able to take your medicine while on trips.  Keep all follow-up visits as told by your health care provider. This is important. Contact a health care provider if:  You missed a dose of your blood thinner medicine. Get help right away if:  You have: ? New or increased pain, swelling, warmth, or redness in an arm or leg. ? Numbness or tingling in an arm or leg. ? Shortness of breath during activity or at rest. ? A fever. ? Chest pain. ? A rapid or irregular heartbeat. ? A severe headache. ? Vision changes. ? A serious fall or accident, or you hit your head. ? Stomach (abdominal) pain. ? Blood in your vomit, stool, or urine. ? A cut that will not stop bleeding.  You cough up blood.  You feel  light-headed or dizzy.  You cannot move your arms or legs.  You are confused or have memory loss. These symptoms may represent a serious problem that is an emergency. Do not wait to see if the symptoms will go away. Get medical help right away. Call your local emergency services (911 in the [place] Do not drive yourself to the hospital. Summary  A pulmonary embolism (PE) is a sudden blockage or decrease of blood flow in one or both lungs. PE is a dangerous and life-threatening condition that needs to be treated right away.  Treatments for this condition usually include medicines to thin your blood (anticoagulants) or medicines to break apart blood clots (thrombolytics).  If you are given blood thinners, it is important to take the medicine every day at the same time each day.  Understand what foods and drugs interact with any medicines that you are taking.  If you have signs of PE or DVT, call your local emergency services (911 in the [place] This information is not intended to replace advice given to you by your health care provider. Make sure you discuss any questions  you have with your health care provider. Document Revised: 12/27/2017 Document Reviewed: 12/27/2017 Elsevier Patient Education  2020 [place].   Deep Vein Thrombosis  Deep vein thrombosis (DVT) is a condition in which a blood clot forms in a deep vein, such as a lower leg, thigh, or arm vein. A clot is blood that has thickened into a gel or solid. This condition is dangerous. It can lead to serious and even life-threatening complications if the clot travels to the lungs and causes a blockage (pulmonary embolism). It can also damage veins in the leg. This can result in leg pain, swelling, discoloration, and sores (post-thrombotic syndrome). What are the causes? This condition may be caused by:  A slowdown of blood flow.  Damage to a vein.  A condition that causes blood to clot more easily, such as an inherited  clotting disorder. What increases the risk? The following factors may make you more likely to develop this condition:  Being overweight.  Being older, especially over age 36.  Sitting or lying down for more than four hours.  Being in the hospital.  Lack of physical activity (sedentary lifestyle).  Pregnancy, being in childbirth, or having recently given birth.  Taking medicines that contain estrogen, such as medicines to prevent pregnancy.  Smoking.  A history of any of the following: ? Blood clots or a blood clotting disease. ? Peripheral vascular disease. ? Inflammatory bowel disease. ? Cancer. ? Heart disease. ? Genetic conditions that affect how your blood clots, such as Factor V Leiden mutation. ? Neurological diseases that affect your legs (leg paresis). ? A recent injury, such as a car accident. ? Major or lengthy surgery. ? A central line placed inside a large vein. What are the signs or symptoms? Symptoms of this condition include:  Swelling, pain, or tenderness in an arm or leg.  Warmth, redness, or discoloration in an arm or leg. If the clot is in your leg, symptoms may be more noticeable or worse when you stand or walk. Some people may not develop any symptoms. How is this diagnosed? This condition is diagnosed with:  A medical history and physical exam.  Tests, such as: ? Blood tests. These are done to check how well your blood clots. ? Ultrasound. This is done to check for clots. ? Venogram. For this test, contrast dye is injected into a vein and X-rays are taken to check for any clots. How is this treated? Treatment for this condition depends on:  The cause of your DVT.  Your risk for bleeding or developing more clots.  Any other medical conditions that you have. Treatment may include:  Taking a blood thinner (anticoagulant). This type of medicine prevents clots from forming. It may be taken by mouth, injected under the skin, or injected through  an IV (catheter).  Injecting clot-dissolving medicines into the affected vein (catheter-directed thrombolysis).  Having surgery. Surgery may be done to: ? Remove the clot. ? Place a filter in a large vein to catch blood clots before they reach the lungs. Some treatments may be continued for up to six months. Follow these instructions at home: If you are taking blood thinners:  Take the medicine exactly as told by your health care provider. Some blood thinners need to be taken at the same time every day. Do not skip a dose.  Talk with your health care provider before you take any medicines that contain aspirin or NSAIDs. These medicines increase your risk for dangerous bleeding.  Ask  your health care provider about foods and drugs that could change the way the medicine works (may interact). Avoid those things if your health care provider tells you to do so.  Blood thinners can cause easy bruising and may make it difficult to stop bleeding. Because of this: ? Be very careful when using knives, scissors, or other sharp objects. ? Use an electric razor instead of a blade. ? Avoid activities that could cause injury or bruising, and follow instructions about how to prevent falls.  Wear a medical alert bracelet or carry a card that lists what medicines you take. General instructions  Take over-the-counter and prescription medicines only as told by your health care provider.  Return to your normal activities as told by your health care provider. Ask your health care provider what activities are safe for you.  Wear compression stockings if recommended by your health care provider.  Keep all follow-up visits as told by your health care provider. This is important. How is this prevented? To lower your risk of developing this condition again:  For 30 or more minutes every day, do an activity that: ? Involves moving your arms and legs. ? Increases your heart rate.  When traveling for longer  than four hours: ? Exercise your arms and legs every hour. ? Drink plenty of water. ? Avoid drinking alcohol.  Avoid sitting or lying for a long time without moving your legs.  If you have surgery or you are hospitalized, ask about ways to prevent blood clots. These may include taking frequent walks or using anticoagulants.  Stay at a healthy weight.  If you are a woman who is older than age 78, avoid unnecessary use of medicines that contain estrogen, such as some birth control pills.  Do not use any products that contain nicotine or tobacco, such as cigarettes and e-cigarettes. This is especially important if you take estrogen medicines. If you need help quitting, ask your health care provider. Contact a health care provider if:  You miss a dose of your blood thinner.  Your menstrual period is heavier than usual.  You have unusual bruising. Get help right away if:  You have: ? New or increased pain, swelling, or redness in an arm or leg. ? Numbness or tingling in an arm or leg. ? Shortness of breath. ? Chest pain. ? A rapid or irregular heartbeat. ? A severe headache or confusion. ? A cut that will not stop bleeding.  There is blood in your vomit, stool, or urine.  You have a serious fall or accident, or you hit your head.  You feel light-headed or dizzy.  You cough up blood. These symptoms may represent a serious problem that is an emergency. Do not wait to see if the symptoms will go away. Get medical help right away. Call your local emergency services (911 in the [place] Do not drive yourself to the hospital. Summary  Deep vein thrombosis (DVT) is a condition in which a blood clot forms in a deep vein, such as a lower leg, thigh, or arm vein.  Symptoms can include swelling, warmth, pain, and redness in your leg or arm.  This condition may be treated with a blood thinner (anticoagulant medicine), medicine that is injected to dissolve blood clots,compression stockings,  or surgery.  If you are prescribed blood thinners, take them exactly as told. This information is not intended to replace advice given to you by your health care provider. Make sure you discuss any questions you  have with your health care provider. Document Revised: 03/03/2017 Document Reviewed: 08/19/2016 Elsevier Patient Education  [place].

## 2019-12-17 NOTE — Assessment & Plan Note (Addendum)
-   Mild ground glass opacities are nonspecific. He has no overt infectious symptoms. Denies fever, chills, fatigue, cough, chest tightness or wheezing.  - CXR today showed persistent left basilar infiltrate, increased.   - Checking esophagram d/t reflux - Needs follow-up CTA out patient

## 2019-12-17 NOTE — Assessment & Plan Note (Addendum)
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 Likely provoked from knee injury and 4 hours car ride to [employer]. Currently on Eliquis10mg  BID; he is to decrease dose to 5mg  twice daily tomorrow and will continue on this for minimum of 3 months. Would refer dopplers prior to discontinuing anticoagulate towards December 2021. Refer to hematology to rule/out potential underlying factors that may have precipitated blood clot

## 2019-12-17 NOTE — Progress Notes (Signed)
@Patient  ID: Casey Park, male    DOB: [birth year], 49 y.o.   MRN: [id number]  Chief Complaint  Patient presents with  . Hospitalization Follow-up    Pt states he is taking one day at a time trying to recooperate after hospital stay. Pt has had problems with SOB as well as pain in lungs. Denies any complaints of cough.    Referring provider: No ref. provider found  HPI: 49 year old male, current some day smoker. PMH significant for PE, DVT, pulmonary infarct, HTN, PTSD. Patient recently admitted with complaints of atypical chest pain. CXR showed opacity LLL. CTA showed suspicion subsegmental PE to the anterior left upper lobe. Subtle peripheral ground glass opacities suspicious for mild covid-19 pneumonia. Patient continues to deny any left sided discomfort, cough or fever. Plan continue anticoagulation for at least 3 months. Mild ground glass opacities which are nonspecific, no infectious symptoms. Consider esophagram. We will need follow-up CT as out patient. Consulted in patient by Dr. Vaughan Browner.   [facility] course: 49 year old male who presented to ED w/ cc: band-like pleuritic chest pain first noted the PM hours of 9/5. No cough, fever, chills, sick exposures or recent illness. Did travel to [facility] the weekend of 9/3. Described the pressure as worse when lying down, better w/ sitting up or walking. Did have recent swelling in the left knee w/ tightness and swelling in the left lower leg.  In ED a CT chest was obtained. It was poor quality but raised concern for subsegmental PE in the left anterior lobe, subtle gg airspace disease and dense consolidation on left lung base.  He was admitted w/ working dx of PE w/ possible pulmonary infarct. Pulmonary asked to evaluate   DVT w/ what looks like pulmonary infarct at left lung base.  -this would explain his discomfort as well -provoking factor would be recent travel  Plan Await LE [place] Cont systemic anticoagulation will need at least 3  months of therapy; agree reasonable to transition to DOAC at dc Re: possible infarct would repeat CXR in am; some times these can become infected so need to watch for fever, cough or worsening sob/return of chest pain  Will get him a f/u appoin  tment in our office after dc for f/u cxr.   Bilateral ground glass pulm infiltrates.  Etiology not clear. Has h/o sig reflux and this has been a chronic but also was fairly significant this weekend Plan Checking BNP Check sed rate Ck PCT Will get esophagram  Dr Vaughan Browner to follow   Pleuritic type chest pain. Reproducible any time he is in supine position -I am worried about reflux Plan Esophagram    12/17/2019- Interim hx Patient presents today for hospital follow-up RLE DVT/PR. Patient is currently on Eliquis 10mg  twice daily, he is due to decrease dose tomorrow to 5mg  twice daily. He will need to stay on therapy for 3 months. He originally had left sided pain which was unbearable. States that he took 800mg  of motrin and this improved. States that tylenol does not help. He is now experiencing posterior right sided pain with deep breath or movement. Associated shortness of breath. O2 100% today on RA. He is discourage about recent set back and as to why he develop blood clot in the first place. He had prior knee injury before 4 hours care drive to [facility] that likely provided DVT. He received pfizer vaccine in April. He started walking on treadmill and using  [employer]. Advised  against heavy lifting and keeping HR <130. Denies fever, chills, cough, chest tightness or wheezing.    Significant testing: CTA showed suspicion subsegmental PE to the anterior left upper lobe. Subtle peripheral ground glass opacities suspicious for mild covid-19 pneumonia. Dense consolidation left lung base which could represent a focus of infection or infarct in the patient had pulmonary emboli.   Echocardiogram- EF 60-65%, normal left ventricle function, mild  concentric LVH,  Mild dilatation aortic root  Venous Dopplers positive for DVT right lower extremit  SARS-CoV2 negative  D-dimer 2.79  No Known Allergies   There is no immunization history on file for this patient.  Past Medical History:  Diagnosis Date  . GERD (gastroesophageal reflux disease)   . Hyperlipidemia   . Hypertension   . PTSD (post-traumatic stress disorder)     Tobacco History: Social History   Tobacco Use  Smoking Status Former Smoker  . Packs/day: 1.00  . Years: 25.00  . Pack years: 25.00  . Types: Cigarettes, Cigars  . Quit date: 12/09/2019  . Years since quitting: 0.0  Smokeless Tobacco Never Used   Counseling given: Not Answered   Outpatient Medications Prior to Visit  Medication Sig Dispense Refill  . apixaban (ELIQUIS) 5 MG TABS tablet Take 2 tablets (10mg ) twice daily for 7 days, then 1 tablet (5mg ) twice daily 60 tablet 1  . buPROPion (WELLBUTRIN XL) 150 MG 24 hr tablet Take 150 mg by mouth every morning.    . gabapentin (NEURONTIN) 100 MG capsule Take 100 mg by mouth 3 (three) times daily.    [name] lisinopril (ZESTRIL) 10 MG tablet Take 10 mg by mouth daily.    . pantoprazole (PROTONIX) 40 MG tablet Take 40 mg by mouth daily.    . rosuvastatin (CRESTOR) 10 MG tablet Take 10 mg by mouth daily.    . valACYclovir (VALTREX) 500 MG tablet Take 500 mg by mouth daily.    [name] albuterol (VENTOLIN HFA) 108 (90 Base) MCG/ACT inhaler Inhale 2 puffs into the lungs as needed for wheezing or shortness of breath. (Patient not taking: Reported on 12/17/2019)     No facility-administered medications prior to visit.   Review of Systems  Review of Systems  Constitutional: Negative for fatigue and fever.  Respiratory: Positive for shortness of breath. Negative for cough, chest tightness and wheezing.   Musculoskeletal: Positive for back pain.    Physical Exam  BP 118/80 (BP Location: Left Arm, Cuff Size: Large)   Pulse 94   Temp 97.6 F (36.4 C) (Other  (Comment)) Comment (Src): wrist  Ht 6' (1.829 m)   Wt 253 lb 3.2 oz (114.9 kg)   SpO2 100%   BMI 34.34 kg/m  Physical Exam Constitutional:      Appearance: Normal appearance.  Cardiovascular:     Rate and Rhythm: Normal rate and regular rhythm.  Pulmonary:     Effort: Pulmonary effort is normal.     Breath sounds: Normal breath sounds. No wheezing or rhonchi.  Skin:    General: Skin is warm and dry.  Neurological:     Mental Status: He is alert.  Psychiatric:        Mood and Affect: Mood normal.        Behavior: Behavior normal.        Thought Content: Thought content normal.        Judgment: Judgment normal.      Lab Results:  CBC    Component Value Date/Time   WBC 8.6 12/10/2019  0604   RBC 4.37 12/10/2019 0604   HGB 13.4 12/10/2019 0604   HCT 39.0 12/10/2019 0604   PLT 175 12/10/2019 0604   MCV 89.2 12/10/2019 0604   MCH 30.7 12/10/2019 0604   MCHC 34.4 12/10/2019 0604   RDW 13.2 12/10/2019 0604    BMET    Component Value Date/Time   NA 140 12/10/2019 0604   K 4.6 12/10/2019 0604   CL 107 12/10/2019 0604   CO2 24 12/10/2019 0604   GLUCOSE 101 (H) 12/10/2019 0604   BUN 10 12/10/2019 0604   CREATININE 1.02 12/10/2019 0604   CALCIUM 8.4 (L) 12/10/2019 0604   GFRNONAA >60 12/10/2019 0604   GFRAA >60 12/10/2019 0604    BNP    Component Value Date/Time   BNP 15.3 12/10/2019 1623    ProBNP No results found for: PROBNP  Imaging: DG Chest 2 View  Result Date: 12/17/2019 CLINICAL DATA:  F/U LLL opacity, pulmonary infarct, history of hypertension EXAM: CHEST - 2 VIEW COMPARISON:  12/09/2019 FINDINGS: Normal heart size, mediastinal contours, and pulmonary vascularity. Persistent LEFT basilar infiltrate, increased. No additional infiltrate, pleural effusion, or pneumothorax. Osseous structures unremarkable. IMPRESSION: Increased LEFT basilar infiltrate. Electronically Signed   By: Lavonia Dana M.D.   On: 12/17/2019 09:53   DG Chest 2 View  Result Date:  12/09/2019 CLINICAL DATA:  Chest discomfort since last night; patient states that the pain intensifies when he takes breath in, it feels like when he takes in very cold air. No known cardiopulmonary problems; occasional smoker x 15 years; HTN EXAM: CHEST - 2 VIEW COMPARISON:  04/27/2016 FINDINGS: Small area of opacity at the left lateral lung base best seen on the frontal view, new since the prior exam. Remainder of the lungs is clear. No pleural effusion or pneumothorax. Cardiac silhouette is normal in size. No mediastinal or hilar masses or evidence of adenopathy. Skeletal structures are intact. IMPRESSION: 1. Small area of opacity at the left lateral lung base. This may reflect pneumonia or be due to atelectasis/scarring. No other evidence of acute cardiopulmonary disease. Electronically Signed   By: Lajean Manes M.D.   On: 12/09/2019 16:08   CT Angio Chest PE W and/or Wo Contrast  Result Date: 12/09/2019 CLINICAL DATA:  Chest pain.  Pleuritic. EXAM: CT ANGIOGRAPHY CHEST WITH CONTRAST TECHNIQUE: Multidetector CT imaging of the chest was performed using the standard protocol during bolus administration of intravenous contrast. Multiplanar CT image reconstructions and MIPs were obtained to evaluate the vascular anatomy. CONTRAST:  153mL OMNIPAQUE IOHEXOL 350 MG/ML SOLN COMPARISON:  12/09/2019 plain film.  No prior CT. FINDINGS: Cardiovascular: The quality of this exam for evaluation of pulmonary embolism is poor. In addition to motion, the bolus is suboptimally timed, contrast centered in the brachiocephalic veins and SVC. Suspicion of isolated anterior left upper lobe subsegmental pulmonary embolism, including on 86/5, coronal image 79. No central or large lobar pulmonary embolism identified. Normal aortic caliber. Borderline cardiomegaly, without pericardial effusion. Pulmonary artery enlargement, outflow tract 3.2 cm Mediastinum/Nodes: No mediastinal or hilar adenopathy. Lungs/Pleura: Trace bilateral pleural  fluid. Mild motion degradation inferiorly. Peripheral predominant mild ground-glass opacities, worse on the left than right. Dense left lower lobe consolidation, including on 105/6. Upper Abdomen: Motion degradation continuing. Normal imaged portions of the liver, spleen, stomach, pancreas, gallbladder, adrenal glands, kidneys. Musculoskeletal: No acute osseous abnormality. Review of the MIP images confirms the above findings. IMPRESSION: 1. Poor quality exam for evaluation of pulmonary embolism. Suspicion of a subsegmental pulmonary embolism to  the anterior left upper lobe. Of questionable clinical significance. Otherwise, no evidence of pulmonary embolism to the large lobar level. 2. Subtle peripheral ground-glass opacities which are suspicious for mild COVID-19 pneumonia. 3. Dense consolidation in the left lung base which could represent a focus of infection or infarct if the patient has pulmonary emboli. As this is plain film visible, recommend radiographic follow-up to confirm resolution. 4. Trace bilateral pleural fluid. Electronically Signed   By: Abigail Miyamoto M.D.   On: 12/09/2019 18:10   ECHOCARDIOGRAM COMPLETE  Result Date: 12/10/2019    ECHOCARDIOGRAM REPORT   Patient Name:   Casey Park Date of Exam: 12/10/2019 Medical Rec #:  [id number]      Height:       74.0 in Accession #:    [id number]     Weight:       262.0 lb Date of Birth:  1970/08/04      BSA:          2.438 m Patient Age:    82 years       BP:           120/85 mmHg Patient Gender: M              HR:           73 bpm. Exam Location:  Inpatient Procedure: 2D Echo Indications:    pulmonary embolus 415.19  History:        Patient has no prior history of Echocardiogram examinations.                 Risk Factors:Hypertension, Dyslipidemia and Current Smoker.  Sonographer:    Jannett Celestine RDCS (AE) Referring Phys: [id number] VISHAL R PATEL IMPRESSIONS  1. Left ventricular ejection fraction, by estimation, is 60 to 65%. The left ventricle has normal  function. The left ventricle has no regional wall motion abnormalities. There is mild concentric left ventricular hypertrophy. Left ventricular diastolic parameters were normal.  2. Right ventricular systolic function is normal. The right ventricular size is moderately enlarged. Tricuspid regurgitation signal is inadequate for assessing PA pressure.  3. The mitral valve is normal in structure. No evidence of mitral valve regurgitation. No evidence of mitral stenosis.  4. The aortic valve is normal in structure. Aortic valve regurgitation is not visualized. No aortic stenosis is present.  5. Aortic dilatation noted. There is mild dilatation of the aortic root measuring 41 mm.  6. The inferior vena cava is dilated in size with <50% respiratory variability, suggesting right atrial pressure of 15 mmHg. FINDINGS  Left Ventricle: Left ventricular ejection fraction, by estimation, is 60 to 65%. The left ventricle has normal function. The left ventricle has no regional wall motion abnormalities. The left ventricular internal cavity size was normal in size. There is  mild concentric left ventricular hypertrophy. Left ventricular diastolic parameters were normal. Normal left ventricular filling pressure. Right Ventricle: The right ventricular size is moderately enlarged. No increase in right ventricular wall thickness. Right ventricular systolic function is normal. Tricuspid regurgitation signal is inadequate for assessing PA pressure. Left Atrium: Left atrial size was normal in size. Right Atrium: Right atrial size was normal in size. Pericardium: There is no evidence of pericardial effusion. Mitral Valve: The mitral valve is normal in structure. Normal mobility of the mitral valve leaflets. No evidence of mitral valve regurgitation. No evidence of mitral valve stenosis. Tricuspid Valve: The tricuspid valve is normal in structure. Tricuspid valve regurgitation is not demonstrated. No evidence of tricuspid  stenosis. Aortic  Valve: The aortic valve is normal in structure. Aortic valve regurgitation is not visualized. No aortic stenosis is present. Pulmonic Valve: The pulmonic valve was normal in structure. Pulmonic valve regurgitation is not visualized. No evidence of pulmonic stenosis. Aorta: Aortic dilatation noted. There is mild dilatation of the aortic root measuring 41 mm. Venous: The inferior vena cava is dilated in size with less than 50% respiratory variability, suggesting right atrial pressure of 15 mmHg. IAS/Shunts: No atrial level shunt detected by color flow Doppler.  LEFT VENTRICLE PLAX 2D LVIDd:         4.40 cm  Diastology LVIDs:         3.10 cm  LV e' lateral:   15.30 cm/s LV PW:         1.20 cm  LV E/e' lateral: 4.1 LV IVS:        1.20 cm  LV e' medial:    9.36 cm/s LVOT diam:     2.40 cm  LV E/e' medial:  6.6 LV SV:         83 LV SV Index:   34 LVOT Area:     4.52 cm  RIGHT VENTRICLE TAPSE (M-mode): 1.8 cm LEFT ATRIUM             Index       RIGHT ATRIUM           Index LA diam:        3.80 cm 1.56 cm/m  RA Area:     23.30 cm LA Vol (A2C):   73.8 ml 30.27 ml/m RA Volume:   75.10 ml  30.81 ml/m LA Vol (A4C):   27.4 ml 11.24 ml/m LA Biplane Vol: 46.3 ml 18.99 ml/m  AORTIC VALVE LVOT Vmax:   84.20 cm/s LVOT Vmean:  61.300 cm/s LVOT VTI:    0.184 m  AORTA Ao Root diam: 4.10 cm MITRAL VALVE MV Area (PHT): 3.12 cm    SHUNTS MV Decel Time: 243 msec    Systemic VTI:  0.18 m MV E velocity: 62.10 cm/s  Systemic Diam: 2.40 cm MV A velocity: 55.70 cm/s MV E/A ratio:  1.11 Fransico Him MD Electronically signed by Fransico Him MD Signature Date/Time: 12/10/2019/9:22:57 AM    Final    VAS [place] LOWER EXTREMITY VENOUS (DVT)  Result Date: 12/10/2019  Lower Venous DVTStudy Indications: Pulmonary embolism, and recent travel.  Comparison Study: No prior study Performing Technologist: Maudry Mayhew MHA, RDMS, RVT, RDCS  Examination Guidelines: A complete evaluation includes B-mode imaging, spectral Doppler, color Doppler, and  power Doppler as needed of all accessible portions of each vessel. Bilateral testing is considered an integral part of a complete examination. Limited examinations for reoccurring indications may be performed as noted. The reflux portion of the exam is performed with the patient in reverse Trendelenburg.  +---------+---------------+---------+-----------+----------+--------------+ RIGHT    CompressibilityPhasicitySpontaneityPropertiesThrombus Aging +---------+---------------+---------+-----------+----------+--------------+ CFV      Full           Yes      Yes                                 +---------+---------------+---------+-----------+----------+--------------+ SFJ      Full                                                        +---------+---------------+---------+-----------+----------+--------------+  FV Prox  Full                                                        +---------+---------------+---------+-----------+----------+--------------+ FV Mid   Full                                                        +---------+---------------+---------+-----------+----------+--------------+ FV DistalFull                                                        +---------+---------------+---------+-----------+----------+--------------+ PFV      Full                                                        +---------+---------------+---------+-----------+----------+--------------+ POP      Full           Yes      Yes                                 +---------+---------------+---------+-----------+----------+--------------+ PTV      Full                                                        +---------+---------------+---------+-----------+----------+--------------+ PERO     Full                                                        +---------+---------------+---------+-----------+----------+--------------+ Gastroc  None                    No                    Acute          +---------+---------------+---------+-----------+----------+--------------+   +---------+---------------+---------+-----------+----------+--------------+ LEFT     CompressibilityPhasicitySpontaneityPropertiesThrombus Aging +---------+---------------+---------+-----------+----------+--------------+ CFV      Full           Yes      Yes                                 +---------+---------------+---------+-----------+----------+--------------+ SFJ      Full                                                        +---------+---------------+---------+-----------+----------+--------------+  FV Prox  Full                                                        +---------+---------------+---------+-----------+----------+--------------+ FV Mid   Full                                                        +---------+---------------+---------+-----------+----------+--------------+ FV DistalFull                                                        +---------+---------------+---------+-----------+----------+--------------+ PFV      Full                                                        +---------+---------------+---------+-----------+----------+--------------+ POP      Full           Yes      Yes                                 +---------+---------------+---------+-----------+----------+--------------+ PTV      Full                                                        +---------+---------------+---------+-----------+----------+--------------+ PERO     Full                                                        +---------+---------------+---------+-----------+----------+--------------+     Summary: RIGHT: - Findings consistent with acute deep vein thrombosis involving the right gastrocnemius veins. - No cystic structure found in the popliteal fossa.  LEFT: - There is no evidence of deep vein thrombosis in the lower extremity.   - No cystic structure found in the popliteal fossa.  *See table(s) above for measurements and observations. Electronically signed by Deitra Mayo MD on 12/10/2019 at 4:04:29 PM.    Final      Assessment & Plan:   Lower leg DVT (deep venous thromboembolism), acute, right ([place]) - - Venous Dopplers positive for DVT right lower extremity. Likely provoked from knee injury and 4 hours car ride to [employer]. Currently on Eliquis10mg  BID; he is to decrease dose to 5mg  twice daily tomorrow and will continue on this for minimum of 3 months. Would refer dopplers prior to discontinuing anticoagulate towards December 2021. Refer to hematology to rule/out potential underlying factors that may have precipitated blood clot    Pulmonary embolus ([place]) - Stable. O2  100% RA today. Patient is having moderate posterior right sided back pain. Advised against taking Motrin with blood thinner. Rx tramadol 25-50 q 6 hours x 5 days for pain relief, alternating with Tylenol - CTA showed suspicion subsegmental PE to the anterior left upper lobe. Subtle peripheral ground glass opacities suspicious for mild covid-19 pneumonia. - He will needs systemic anticoagulation for at least 3 months then transition to [place] after discontinue   Pulmonary infiltrate - Mild ground glass opacities are nonspecific. He has no overt infectious symptoms. Denies fever, chills, fatigue, cough, chest tightness or wheezing.  - CXR today showed persistent left basilar infiltrate, increased.   - Checking esophagram d/t reflux - Needs follow-up CTA out patient   FU with Dr. Vaughan Browner in 3 weeks after testing.Martyn Ehrich, NP 12/17/2019

## 2019-12-18 ENCOUNTER — Ambulatory Visit (INDEPENDENT_AMBULATORY_CARE_PROVIDER_SITE_OTHER)
Admission: RE | Admit: 2019-12-18 | Discharge: 2019-12-18 | Disposition: A | Discharge status: Home / Self Care | Source: Ambulatory Visit | Attending: Primary Care | Admitting: Primary Care

## 2019-12-18 ENCOUNTER — Other Ambulatory Visit: Payer: Self-pay

## 2019-12-18 ENCOUNTER — Other Ambulatory Visit: Payer: Self-pay | Admitting: *Deleted

## 2019-12-18 ENCOUNTER — Telehealth: Payer: Self-pay | Admitting: Primary Care

## 2019-12-18 ENCOUNTER — Telehealth: Payer: Self-pay | Admitting: Hematology

## 2019-12-18 DIAGNOSIS — I2699 Other pulmonary embolism without acute cor pulmonale: Secondary | ICD-10-CM

## 2019-12-18 IMAGING — CT CT ANGIO CHEST
2 of 8 series · 19 of 46 positions shown · IV contrast (omnipaque)
Comparison: [DATE]

CLINICAL DATA: Pulmonary embolism post treatment

EXAM:
CT ANGIOGRAPHY CHEST WITH CONTRAST
TECHNIQUE: Multidetector CT imaging of the chest was performed using the
standard protocol during bolus administration of intravenous
contrast. Multiplanar CT image reconstructions and MIPs were
obtained to evaluate the vascular anatomy.
CONTRAST:  80mL OMNIPAQUE IOHEXOL 350 MG/ML SOLN IV

[Series 5: thins · axial · 0.75mm/px · z∈[-341,-63]mm · 16 of 314 slices shown]
[im 18/314  lung]
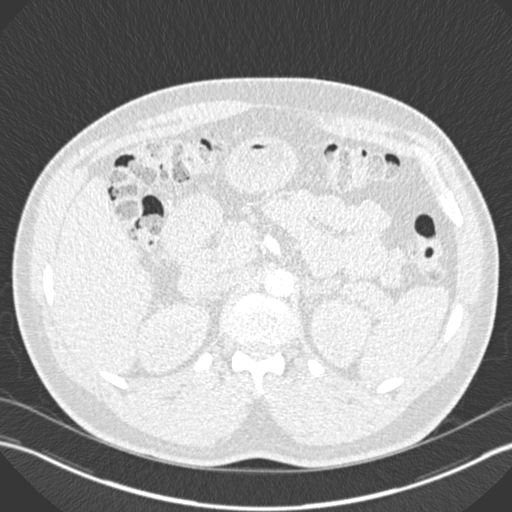
[im 35/314  soft-tissue]
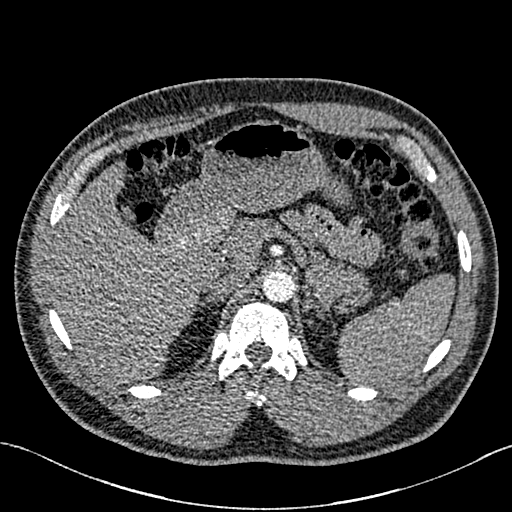
[im 53/314  lung]
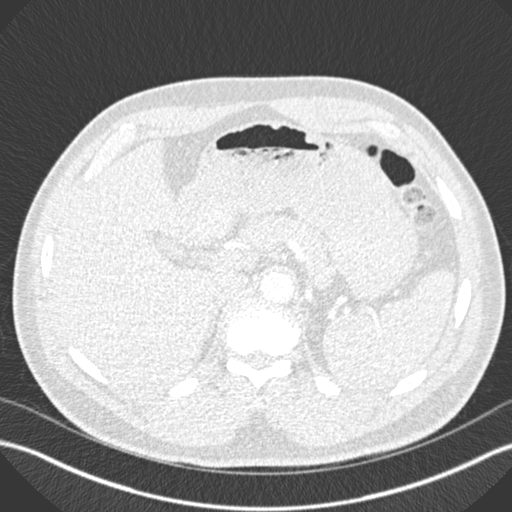
[im 70/314  soft-tissue]
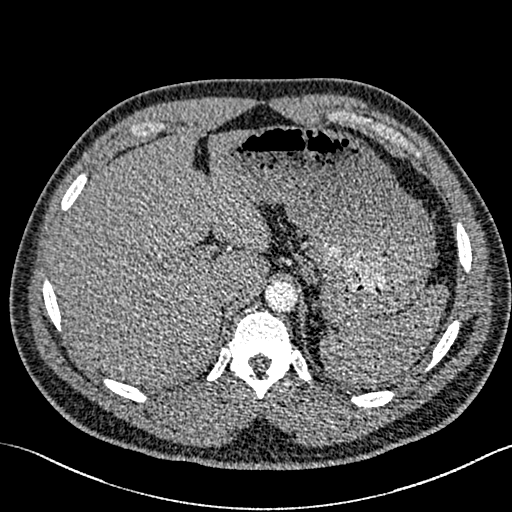
[im 87/314  lung]
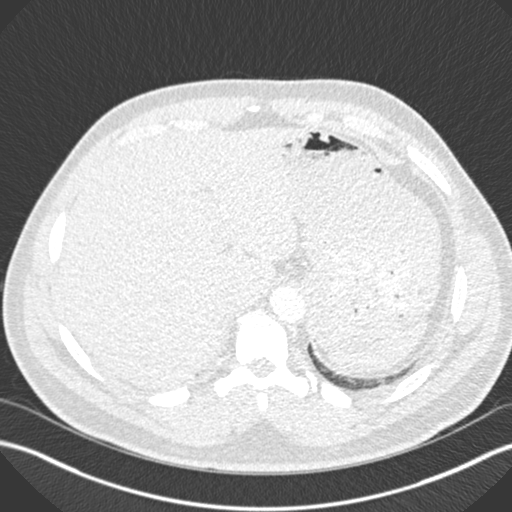
[im 105/314  soft-tissue]
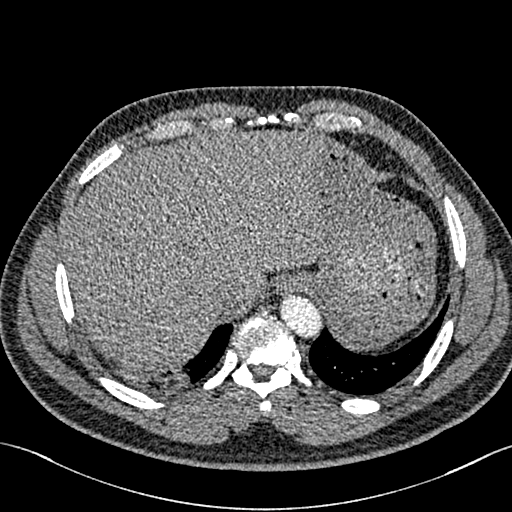
[im 122/314  lung]
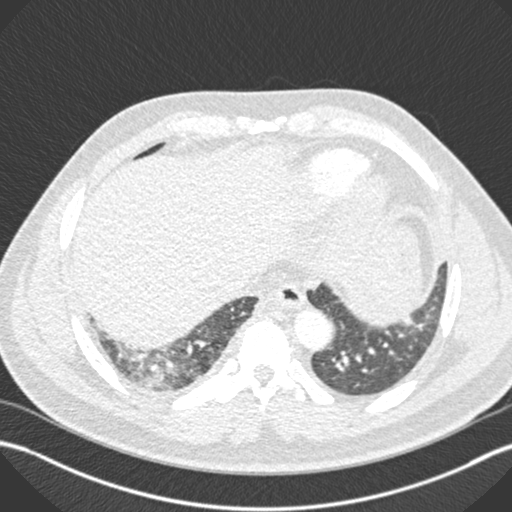
[im 140/314  soft-tissue]
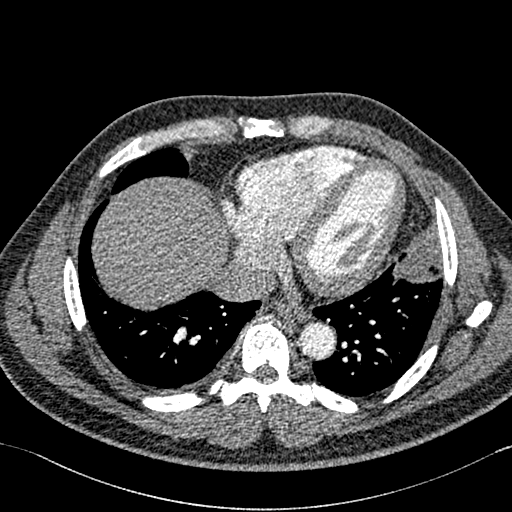
[im 174/314  lung]
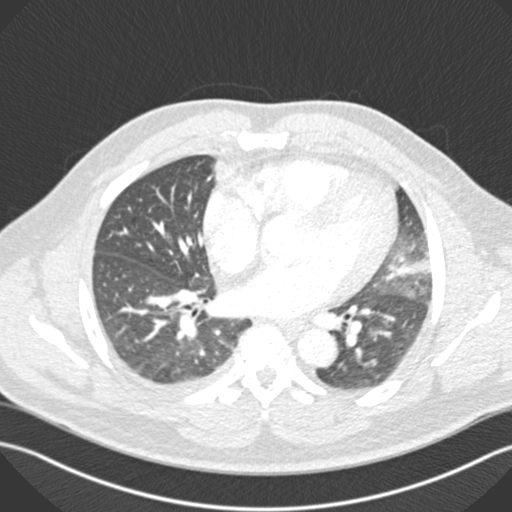
[im 192/314  soft-tissue]
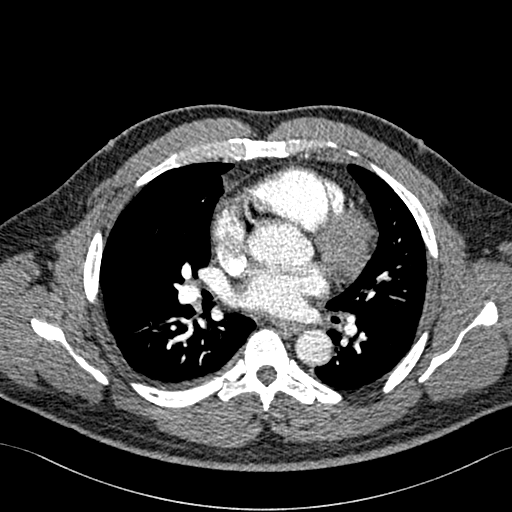
[im 209/314  lung]
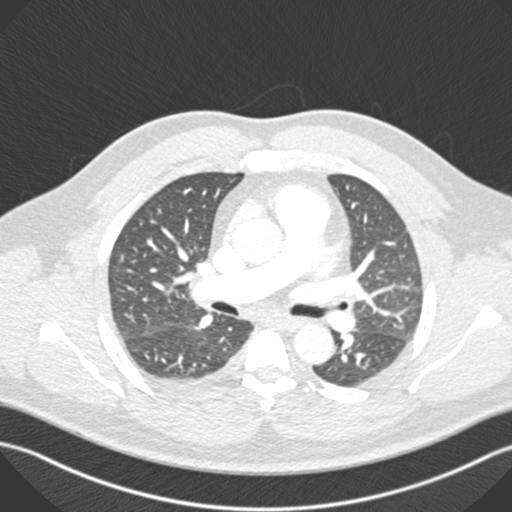
[im 227/314  soft-tissue]
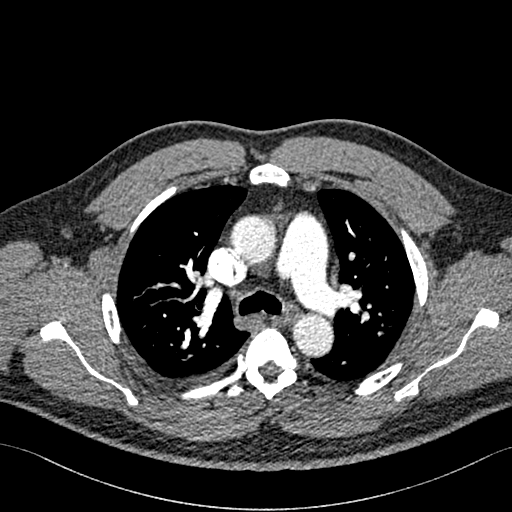
[im 244/314  lung]
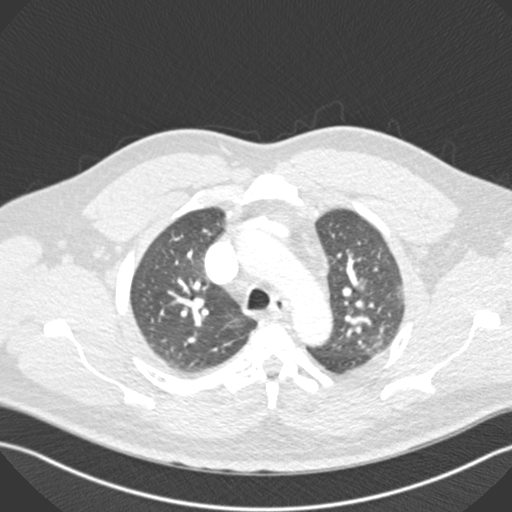
[im 261/314  soft-tissue]
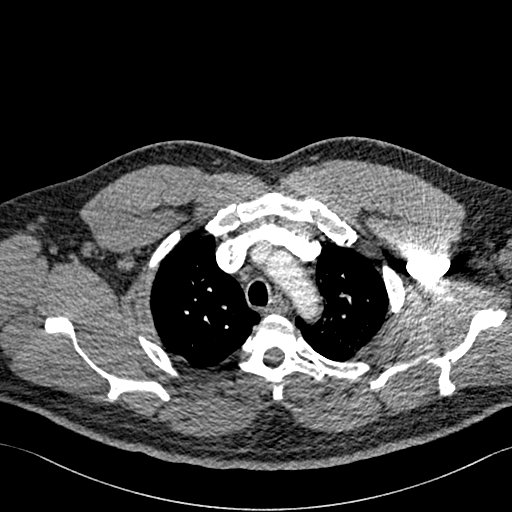
[im 279/314  lung]
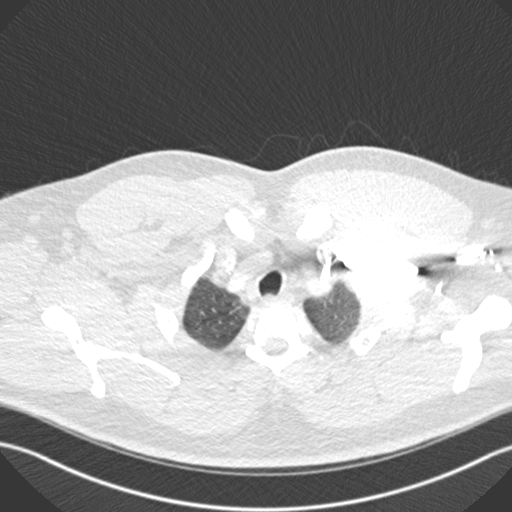
[im 296/314  soft-tissue]
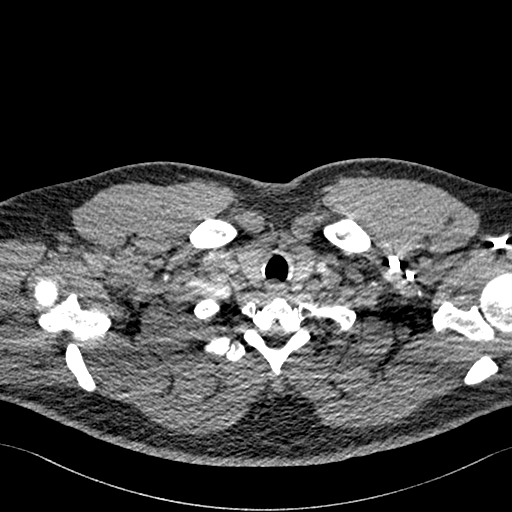

[Series 7: coronal mpr · coronal · 0.61mm/px · 3 of 126 slices shown]
[im 32/126  soft-tissue]
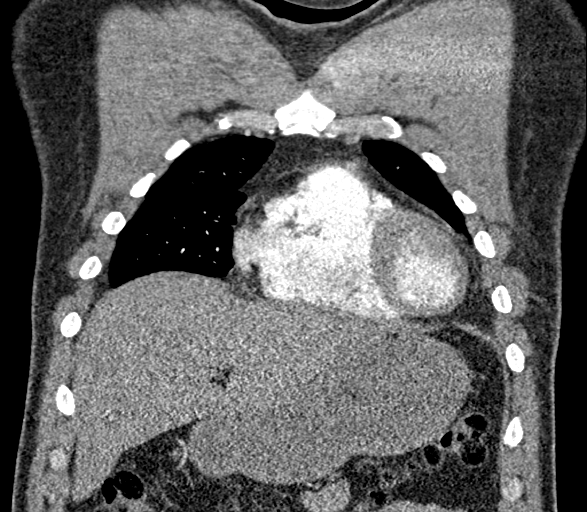
[im 63/126  soft-tissue]
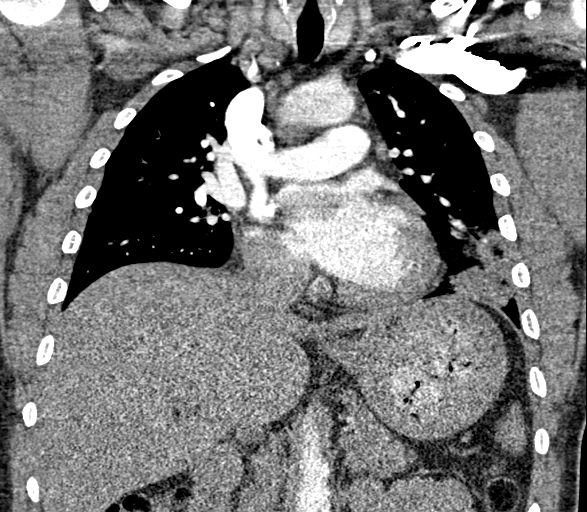
[im 94/126  soft-tissue]
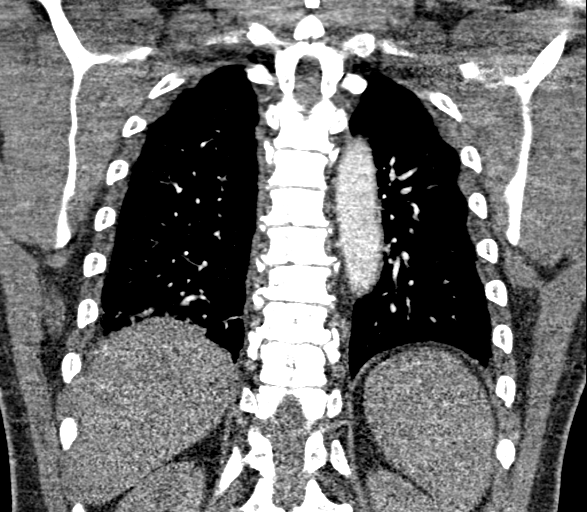

[19 of 46 positions shown; findings below may reference images not displayed]

FINDINGS: Cardiovascular: Aorta normal caliber without aneurysm or dissection.
Heart unremarkable. No pericardial effusion. Pulmonary arteries
patent. No evidence of pulmonary embolism. Pulmonary embolus in LEFT
upper lobe on previous exam no longer identified

Mediastinum/Nodes: Esophagus unremarkable. Scattered normal sized
mediastinal and axillary lymph nodes. No thoracic adenopathy. Base
of cervical region normal appearance.

Lungs/Pleura: Tiny RIGHT pleural effusion. Atelectasis versus
consolidation in lingula and posterior base of RIGHT lower lobe.
Remaining lungs clear. No pneumothorax.

Upper Abdomen: Unremarkable

Musculoskeletal: Unremarkable

Review of the MIP images confirms the above findings.
IMPRESSION: No evidence of pulmonary embolism.

Previously identified pulmonary embolus in LEFT upper lobe no longer
seen.

Tiny RIGHT pleural effusion.

Atelectasis versus consolidation in lingula and posterior base of
RIGHT lower lobe.

## 2019-12-18 MED ORDER — IOHEXOL 350 MG/ML SOLN
80.0000 mL | Freq: Once | INTRAVENOUS | Status: AC | PRN
  Administered 2019-12-18: 80 mL via INTRAVENOUS

## 2019-12-18 NOTE — Progress Notes (Signed)
Please let patient know there was no evidence of pulmonary embolism, pulmonary embolism on left no longer identified.  Continue blood thinner. No NSAIDS.   He had tiny pleural effusion right lung and atelectasis vs consolidation. Encourage deep breathing exercises. Please send in prescription for IS. If he develops cough or fever please notify our office.

## 2019-12-18 NOTE — Telephone Encounter (Signed)
Received a new hem referral from [facility] for PE. Pt has been cld and scheduled to see Dr. Irene Limbo on 9/29 at 1pm. Pt aware to arrive 20-30 minutes early.

## 2019-12-18 NOTE — Telephone Encounter (Signed)
Called and spoke with pt letting him know the results of the CTa. Stated to pt that we were going to send Rx for IS in for him to help with breathing exercises and he verbalized understanding. Stated to pt if symptoms worsened that he would need to seek emergent care and he verbalized understanding. Nothing further needed.

## 2019-12-19 NOTE — Telephone Encounter (Signed)
[name]- please advise on pt email, thanks!  [place],   Can you please let me know if there is any scar tissue in my left lung at this moment.

## 2019-12-19 NOTE — Telephone Encounter (Signed)
Please advise on pt email:  Norton Blizzard "Sam" sent to [facility] [place],  I had the following follow up questions. I did not get a chance to ask Raquel Sarna about these yesterday when she called regarding the follow up to the CAT-SCAN.   1. Am I limited to physical activities? When can I run again, peloton, lift weights, elliptical? Am I limited to not go above a certain hear rate?    2. Can I drink alcohol?   3. I want to be around other people within reason...for example and masking. Am I vulnerable at this state for any amplified complications from a possible COIVD interaction or do I need to wait another month or so?   [place], Andal  [phone]

## 2019-12-19 NOTE — Telephone Encounter (Signed)
I am out of office. No scar tissue left lung

## 2019-12-19 NOTE — Telephone Encounter (Signed)
He can do more physical activities within reason. I would still limit heavy limiting and excess exertion. He should not drink alcohol while being on blood thinner. If he has been vaccinated he can be around other but still needs to socially distance and practice mask wearing and hand washing per CDC recommendation which he can find on their website.

## 2019-12-20 NOTE — Telephone Encounter (Signed)
Aaron Edelman, please see pt's recent mychart message and advise. [name] saw pt for a HFU and then pt had a CTa following that visit which he was made aware of the results.

## 2019-12-20 NOTE — Telephone Encounter (Signed)
12/20/2019  Antibiotics not indicated in this situation.  Please tell the patient happy birthday.  This can be further reviewed when EW NP returns the office next week if she has additional recommendations.  If symptoms worsen or he has acute chest pain he can seek emergent evaluation.  Wyn Quaker, FNP

## 2019-12-27 ENCOUNTER — Other Ambulatory Visit: Payer: Self-pay

## 2019-12-27 ENCOUNTER — Ambulatory Visit (HOSPITAL_COMMUNITY)
Admission: RE | Admit: 2019-12-27 | Discharge: 2019-12-27 | Disposition: A | Discharge status: Home / Self Care | Source: Ambulatory Visit | Attending: Primary Care | Admitting: Primary Care

## 2019-12-27 DIAGNOSIS — I2699 Other pulmonary embolism without acute cor pulmonale: Secondary | ICD-10-CM | POA: Insufficient documentation

## 2019-12-27 IMAGING — RF DG ESOPHAGUS
7 series · 13 of 24 positions shown · non-contrast
Comparison: None.

CLINICAL DATA: Reflux.

EXAM:
ESOPHOGRAM / BARIUM SWALLOW / BARIUM TABLET STUDY
TECHNIQUE: Combined double contrast and single contrast examination performed
using effervescent crystals, thick barium liquid, and thin barium
liquid. The patient was observed with fluoroscopy swallowing a 13 mm
barium sulphate tablet.
FLUOROSCOPY TIME:  Fluoroscopy Time:  1 minutes and 0 seconds.
Radiation Exposure Index (if provided by the fluoroscopic device):
105 mGy
Number of Acquired Spot Images:

[Series 1: fluoro_barium 2fps_bw · 0.17mm/px · 2 of 10 frames shown (1 of 3)]
[frame 2/10]
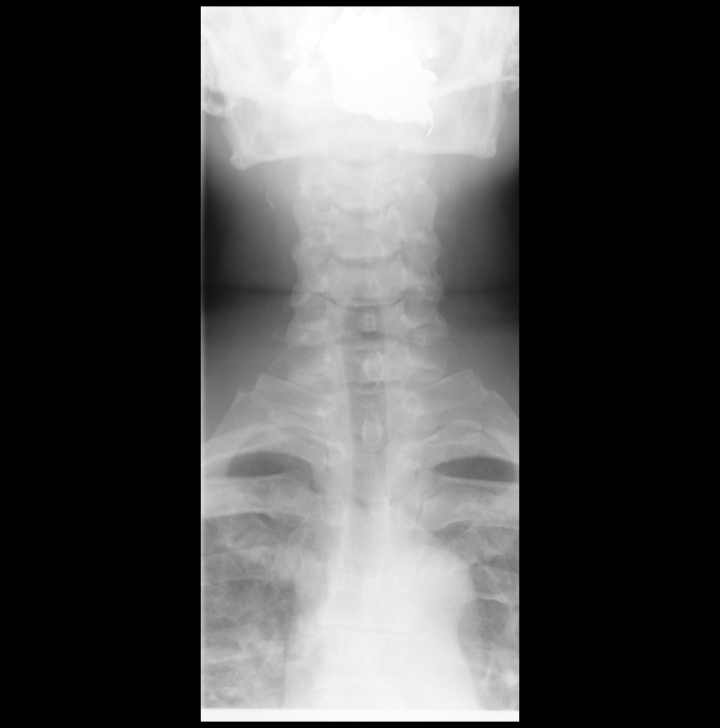
[frame 6/10]
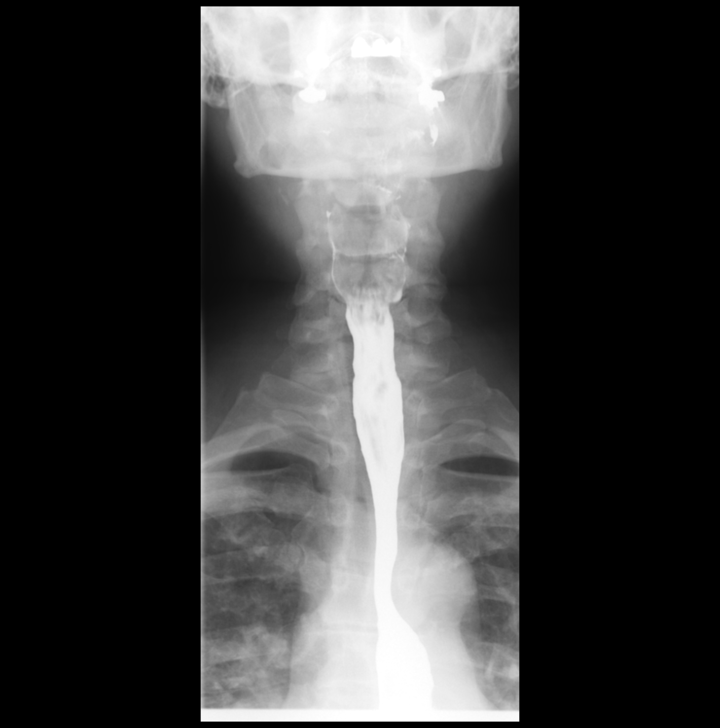

[Series 2: fluoro_barium 2fps_bw · 0.17mm/px · 2 of 9 frames shown (2 of 3)]
[frame 2/9]
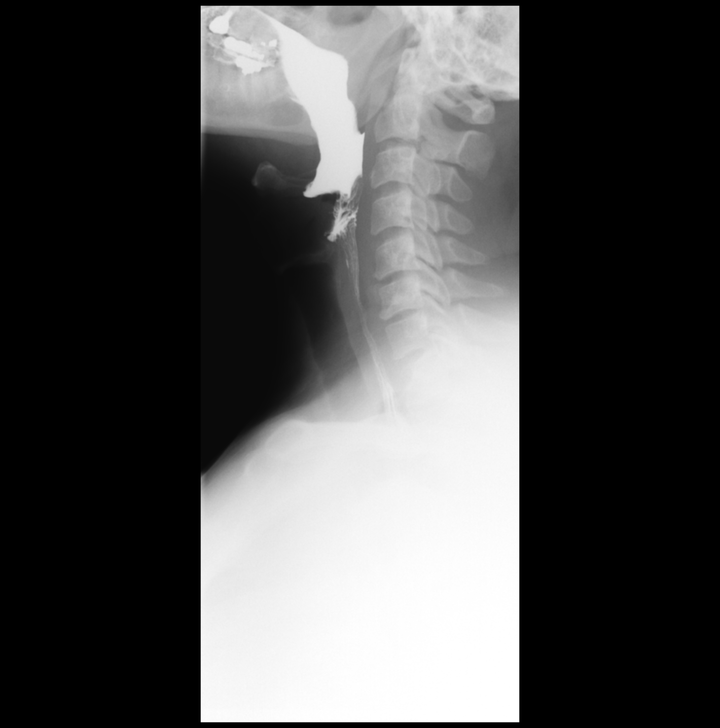
[frame 5/9]
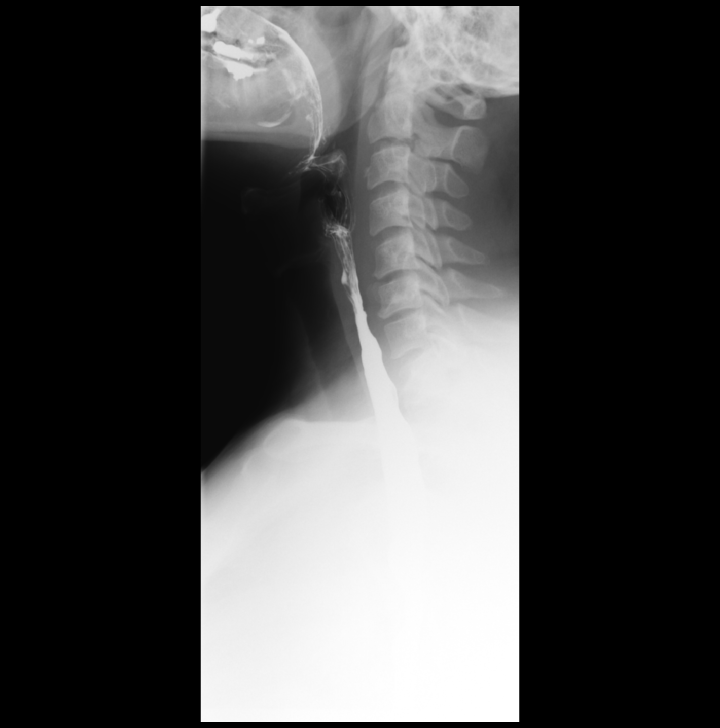

[Series 3: fluoro_barium 2fps_bw · 0.17mm/px · 2 of 40 frames shown (3 of 3)]
[frame 7/40]
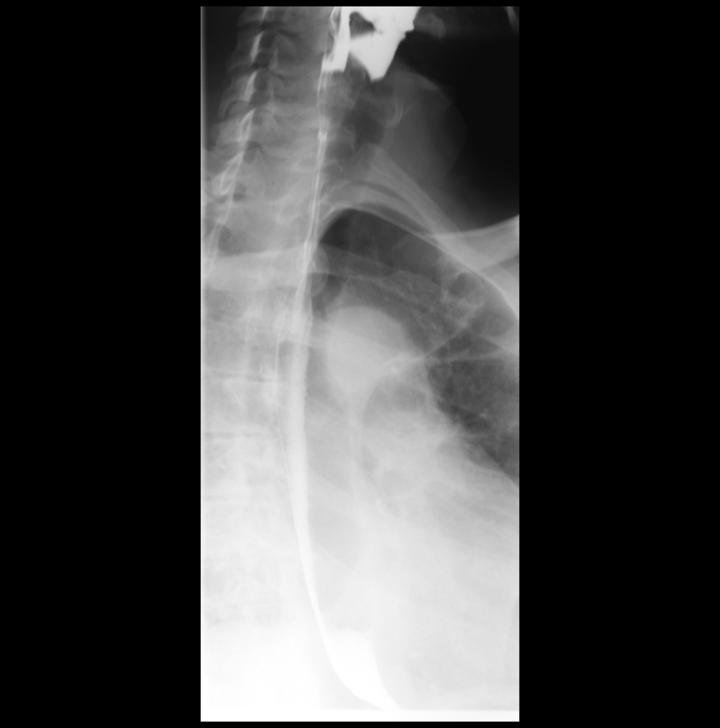
[frame 35/40]
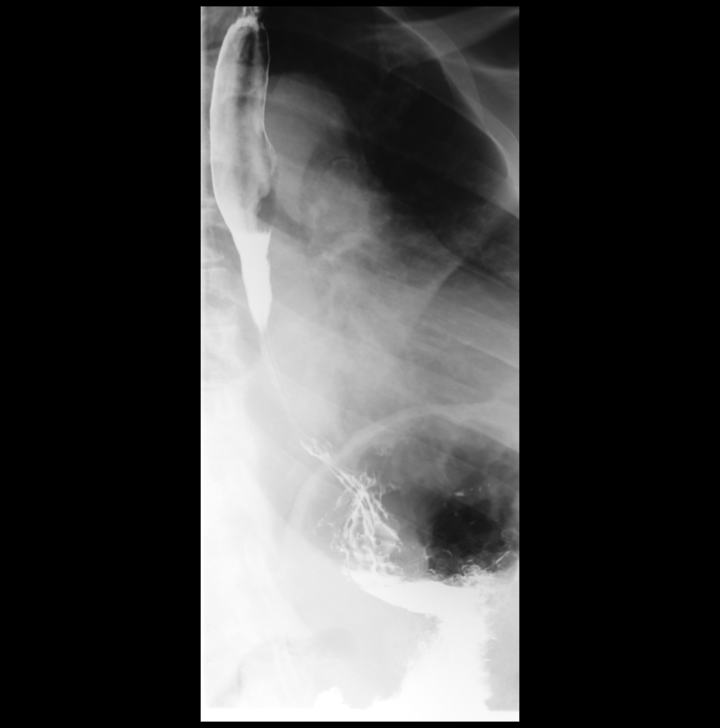

[Series 4: cp_standard · 0.51mm/px · 3 of 24 frames shown (1 of 4)]
[frame 4/24]
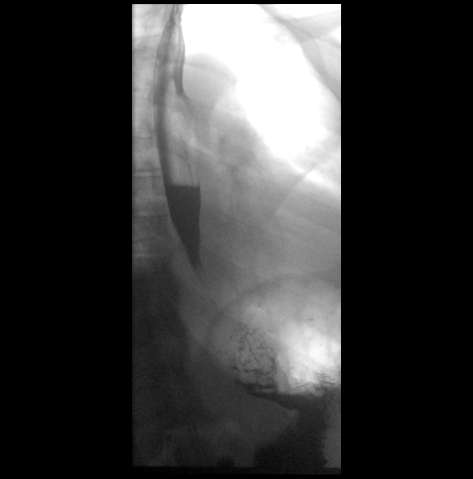
[frame 13/24]
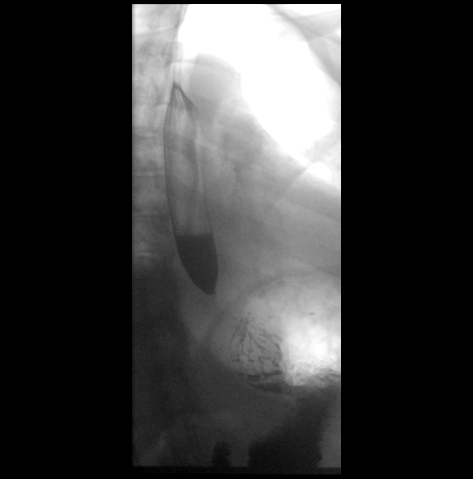
[frame 22/24]
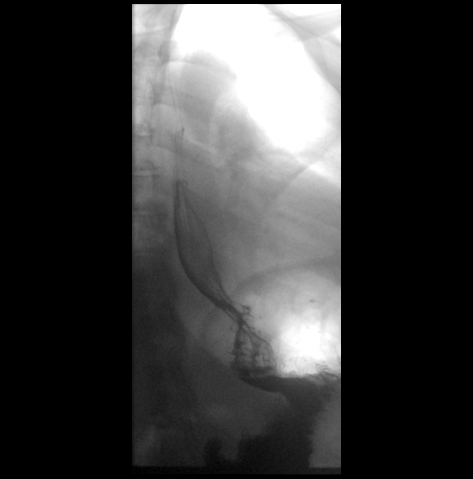

[Series 5: cp_standard · 0.53mm/px · 2 of 21 frames shown (2 of 4)]
[frame 11/21]
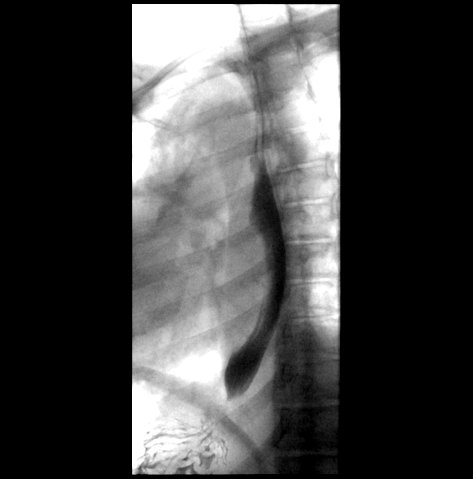
[frame 21/21]
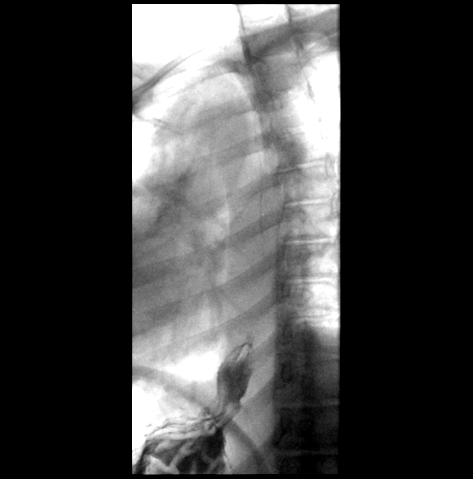

[Series 6: cp_standard · 0.53mm/px · 1 of 19 frames shown (3 of 4)]
[frame 10/19]
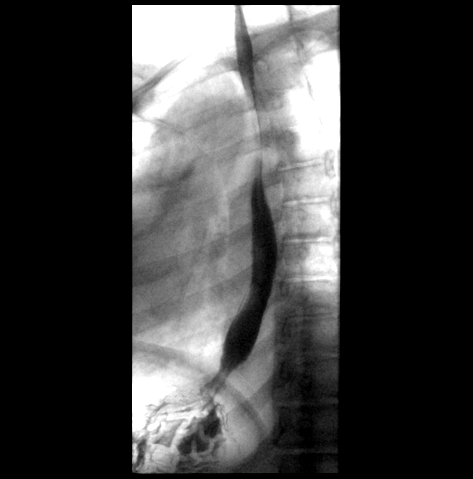

[Series 7: cp_standard · 0.26mm/px · 1 of 1 slices shown (4 of 4)]
[im 1/1]
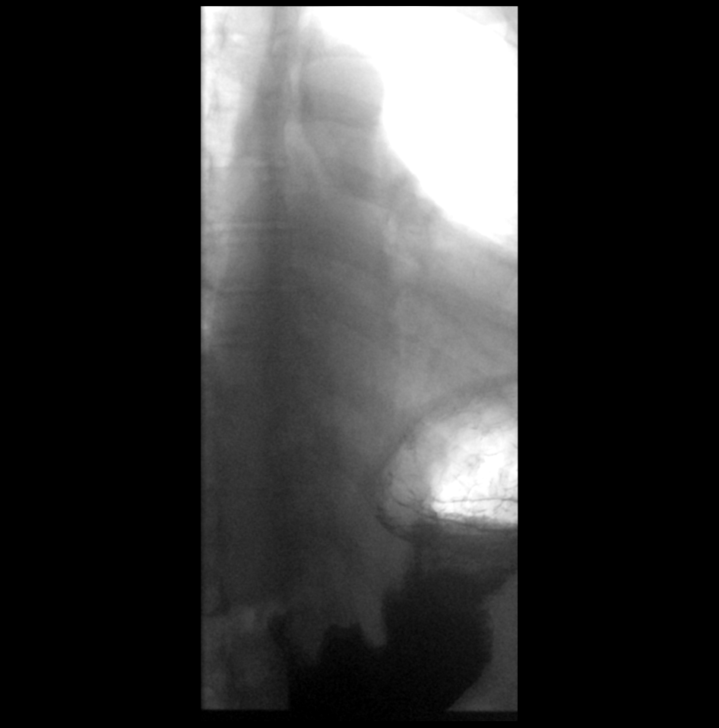

[13 of 24 positions shown; findings below may reference images not displayed]

FINDINGS: Frontal and lateral views of the hypopharynx while swallowing thick
barium are normal.

Double contrast imaging of the esophagus is normal without
stricture, diverticulum, mass lesion, or mucosal ulceration.

Assessment of esophageal motility demonstrates preservation of
primary peristalsis on all observed swallows.

13 mm barium tablet passes readily into the stomach when taken with
water.
IMPRESSION: Normal double contrast barium esophagram.

## 2019-12-27 NOTE — Telephone Encounter (Signed)
Thank you, agree for now. Nothing further

## 2020-01-01 ENCOUNTER — Inpatient Hospital Stay: Attending: Hematology | Admitting: Hematology

## 2020-01-01 ENCOUNTER — Other Ambulatory Visit: Payer: Self-pay

## 2020-01-01 ENCOUNTER — Inpatient Hospital Stay

## 2020-01-01 VITALS — BP 115/73 | HR 60 | Temp 96.3°F | Resp 18 | Ht 72.0 in | Wt 258.4 lb

## 2020-01-01 DIAGNOSIS — Z79899 Other long term (current) drug therapy: Secondary | ICD-10-CM | POA: Diagnosis not present

## 2020-01-01 DIAGNOSIS — Z7901 Long term (current) use of anticoagulants: Secondary | ICD-10-CM | POA: Insufficient documentation

## 2020-01-01 DIAGNOSIS — D6859 Other primary thrombophilia: Secondary | ICD-10-CM

## 2020-01-01 DIAGNOSIS — F17210 Nicotine dependence, cigarettes, uncomplicated: Secondary | ICD-10-CM | POA: Insufficient documentation

## 2020-01-01 DIAGNOSIS — I82461 Acute embolism and thrombosis of right calf muscular vein: Secondary | ICD-10-CM

## 2020-01-01 DIAGNOSIS — I2699 Other pulmonary embolism without acute cor pulmonale: Secondary | ICD-10-CM

## 2020-01-01 LAB — CMP (CANCER CENTER ONLY)
ALT: 21 U/L (ref 0–44)
AST: 22 U/L (ref 15–41)
Albumin: 3.7 g/dL (ref 3.5–5.0)
Alkaline Phosphatase: 83 U/L (ref 38–126)
Anion gap: 8 (ref 5–15)
BUN: 15 mg/dL (ref 6–20)
CO2: 27 mmol/L (ref 22–32)
Calcium: 9.4 mg/dL (ref 8.9–10.3)
Chloride: 103 mmol/L (ref 98–111)
Creatinine: 1.16 mg/dL (ref 0.61–1.24)
GFR, Est AFR Am: >60 mL/min (ref >60)
GFR, Estimated: >60 mL/min (ref >60)
Glucose, Bld: 78 mg/dL (ref 70–99)
Potassium: 4.1 mmol/L (ref 3.5–5.1)
Sodium: 138 mmol/L (ref 135–145)
Total Bilirubin: 0.4 mg/dL (ref 0.3–1.2)
Total Protein: 7.3 g/dL (ref 6.5–8.1)

## 2020-01-01 LAB — CBC WITH DIFFERENTIAL/PLATELET
Abs Immature Granulocytes: 0.02 10*3/uL (ref 0.00–0.07)
Basophils Absolute: 0.1 10*3/uL (ref 0.0–0.1)
Basophils Relative: 1 %
Eosinophils Absolute: 0.3 10*3/uL (ref 0.0–0.5)
Eosinophils Relative: 4 %
HCT: 40.3 % (ref 39.0–52.0)
Hemoglobin: 13.7 g/dL (ref 13.0–17.0)
Immature Granulocytes: 0 %
Lymphocytes Relative: 43 %
Lymphs Abs: 3.3 10*3/uL (ref 0.7–4.0)
MCH: 29.7 pg (ref 26.0–34.0)
MCHC: 34 g/dL (ref 30.0–36.0)
MCV: 87.4 fL (ref 80.0–100.0)
Monocytes Absolute: 0.6 10*3/uL (ref 0.1–1.0)
Monocytes Relative: 7 %
Neutro Abs: 3.4 10*3/uL (ref 1.7–7.7)
Neutrophils Relative %: 45 %
Platelets: 236 10*3/uL (ref 150–400)
RBC: 4.61 MIL/uL (ref 4.22–5.81)
RDW: 13.2 % (ref 11.5–15.5)
WBC: 7.7 10*3/uL (ref 4.0–10.5)
nRBC: 0 % (ref 0.0–0.2)

## 2020-01-01 LAB — ANTITHROMBIN III: AntiThromb III Func: 100 % (ref 75–120)

## 2020-01-01 MED ORDER — APIXABAN 5 MG PO TABS: 5.0000 mg | ORAL_TABLET | Freq: Two times a day (BID) | ORAL | 4 refills | Status: DC

## 2020-01-01 NOTE — Progress Notes (Signed)
HEMATOLOGY/ONCOLOGY CONSULTATION NOTE  Date of Service: 01/01/2020  Patient Care Team: Pcp, No as PCP - General  CHIEF COMPLAINTS/PURPOSE OF CONSULTATION:  PE/DVT  HISTORY OF PRESENTING ILLNESS:   Casey Park is a wonderful 49 y.o. male who has been referred to [place] for evaluation and management of pulmonary embolism/deep vein thrombosis. The pt reports that he is doing well overall.   The pt reports that he was diagnosed with HTN and placed on Lisinopril 7-8 years ago, he does not remember ever being diagnosed with HLD, but was placed on Crestor preventatively. He was also taking a daily baby Aspirin at the time of his PE/DVT. Pt has a history of acid reflux and has been using Pantoprazole as needed. He has no prior history of blood clots.   Pt had a Colonoscopy nearly 6 years ago, where several adenomatous polyps were found. He had a follow up Colonoscopy the next year and was told to follow up in five years. Pt is scheduled for a repeat Colonoscopy next year. Pt denies any GI symptoms.   About a month ago pt did a two mile run and began to notice swelling in his left leg. A few days later he began experiencing a pain in his left scapula. This occurred on a Friday and he spent the following weekend at [facility] with some friends. During the weekend he played golf and participated in other activities with no issue. On Sunday night he began to feel painful chest pressure when laying down. This improved when he sat up. Pt denies sweating, shortness of breath, or palpitations at the time. Pt took Pantoprazole as he was concerned that it was reflux. His symptoms resolved the next day, but he came to the [facility] ED after returning home as a precaution. While in the hospital they completed CT scans and US Venous studies revealing his PE and right-sided DVT. After he was given anticoagulants he began to feel short of breath. He was discharged on Eliquis. Pt continued having sharp, stabbing  pain in his back when laying down at night after discharge. Pt began smoking marijuana every couple of days, which improved his discomfort. He is not currently experiencing any chest pain, back pain, or shortness of breath. He is able to walk 5 miles at least 6 days per week. Pt coughed up dark red blood nearly a week ago. Pt is scheduled to see Dr. Lamonte Sakai on 01/13/20.  He has had no fevers, chills, night sweats, unusual fatigue, or unexpected weight loss and denies taking any steroids or being on any hormonal therapies prior to his clot. Pt was experiencing a significant amount of stress prior to this event. Pt completed his vaccination against COVID19 in April. He denies any recent cough, cold, or any other respiratory symptoms.  Of note prior to the patient's visit today, pt has had CT Angio Chest ([id number]) completed on 12/09/2019 with results revealing "1. Poor quality exam for evaluation of pulmonary embolism. Suspicion of a subsegmental pulmonary embolism to the anterior left upper lobe. Of questionable clinical significance. Otherwise, no evidence of pulmonary embolism to the large lobar level. 2. Subtle peripheral ground-glass opacities which are suspicious for mild COVID-19 pneumonia. 3. Dense consolidation in the left lung base which could represent a focus of infection or infarct if the patient has pulmonary emboli. As this is plain film visible, recommend radiographic follow-up to confirm resolution. 4. Trace bilateral pleural fluid."   Of note since the patient's last visit,  pt has had Lower Venous DVT Study ([id number]) completed on 12/10/2019 with results revealing RIGHT: - Findings consistent with acute deep vein thrombosis involving the right gastrocnemius veins. - No cystic structure found in the popliteal fossa. LEFT: - There is no evidence of deep vein thrombosis in the lower extremity. - No cystic structure found in the popliteal fossa."  Of note since the patient's last visit, pt  has had ECHO completed on 12/10/2019 with results revealing Normal ejection fraction.  Of note since the patient's last visit, pt has had CT Angio Chest ([id number]) completed on 12/18/2019 with results revealing "No evidence of pulmonary embolism. Previously identified pulmonary embolus in LEFT upper lobe no longer seen. Tiny RIGHT pleural effusion. Atelectasis versus consolidation in lingula and posterior base of RIGHT lower lobe."  Most recent lab results (12/10/2019) of CBC is as follows: all values are WNL except for Glucose at 101, Calcium at 8.4. 12/10/2019 CRP at 4.8 12/10/2019 LDH at 227 12/10/2019 Sed Rate at 26 12/10/2019 Fibrinogen at 504 12/10/2019 D-Dimer at 2.79  On review of systems, pt reports hemoptysis and denies unexpected weight loss, abdominal pain, SOB, chest pain, back pain, diarrhea, constipation and any other symptoms.   On PMHx the pt reports GERD, PTSD, HTN, PE, DVT. On Social Hx the pt reports that at his heaviest he smoked about 3/4 ppd but quit on 12/09/19. On Family Hx the pt reports that he has no family history of bleeding or blood clotting disorders.   MEDICAL HISTORY:  Past Medical History:  Diagnosis Date  . GERD (gastroesophageal reflux disease)   . Hyperlipidemia   . Hypertension   . PTSD (post-traumatic stress disorder)     SURGICAL HISTORY: Past Surgical History:  Procedure Laterality Date  . ANTERIOR CRUCIATE LIGAMENT REPAIR    . FOOT SURGERY    . NOSE SURGERY      SOCIAL HISTORY: Social History   Socioeconomic History  . Marital status: Married    Spouse name: Not on file  . Number of children: Not on file  . Years of education: Not on file  . Highest education level: Not on file  Occupational History  . Not on file  Tobacco Use  . Smoking status: Former Smoker    Packs/day: 1.00    Years: 25.00    Pack years: 25.00    Types: Cigarettes, Cigars    Quit date: 12/09/2019    Years since quitting: 0.0  . Smokeless tobacco:  Never Used  Vaping Use  . Vaping Use: Never used  Substance and Sexual Activity  . Alcohol use: Yes  . Drug use: Never  . Sexual activity: Not on file  Other Topics Concern  . Not on file  Social History Narrative  . Not on file   Social Determinants of Health   Financial Resource Strain:   . Difficulty of Paying Living Expenses: Not on file  Food Insecurity:   . Worried About Charity fundraiser in the Last Year: Not on file  . Ran Out of Food in the Last Year: Not on file  Transportation Needs:   . Lack of Transportation (Medical): Not on file  . Lack of Transportation (Non-Medical): Not on file  Physical Activity:   . Days of Exercise per Week: Not on file  . Minutes of Exercise per Session: Not on file  Stress:   . Feeling of Stress : Not on file  Social Connections:   . Frequency of Communication with Friends and Family:  Not on file  . Frequency of Social Gatherings with Friends and Family: Not on file  . Attends Religious Services: Not on file  . Active Member of Clubs or Organizations: Not on file  . Attends Archivist Meetings: Not on file  . Marital Status: Not on file  Intimate Partner Violence:   . Fear of Current or Ex-Partner: Not on file  . Emotionally Abused: Not on file  . Physically Abused: Not on file  . Sexually Abused: Not on file    FAMILY HISTORY: Family History  Problem Relation Age of Onset  . Stroke Father   . Throat cancer Father     ALLERGIES:  has No Known Allergies.  MEDICATIONS:  Current Outpatient Medications  Medication Sig Dispense Refill  . albuterol (VENTOLIN HFA) 108 (90 Base) MCG/ACT inhaler Inhale 2 puffs into the lungs as needed for wheezing or shortness of breath. (Patient not taking: Reported on 12/17/2019)    . apixaban (ELIQUIS) 5 MG TABS tablet Take 1 tablet (5 mg total) by mouth 2 (two) times daily. 60 tablet 4  . buPROPion (WELLBUTRIN XL) 150 MG 24 hr tablet Take 150 mg by mouth every morning.    .  gabapentin (NEURONTIN) 100 MG capsule Take 100 mg by mouth 3 (three) times daily.    [name] lisinopril (ZESTRIL) 10 MG tablet Take 10 mg by mouth daily.    . pantoprazole (PROTONIX) 40 MG tablet Take 40 mg by mouth daily.    . rosuvastatin (CRESTOR) 10 MG tablet Take 10 mg by mouth daily.    . traMADol (ULTRAM) 50 MG tablet 1/2-1 tab every 6 hours as needed for pain x 5 days 20 tablet 0  . valACYclovir (VALTREX) 500 MG tablet Take 500 mg by mouth daily.     No current facility-administered medications for this visit.    REVIEW OF SYSTEMS:    10 Point review of Systems was done is negative except as noted above.  PHYSICAL EXAMINATION: ECOG PERFORMANCE STATUS: 0 - Asymptomatic  . Vitals:   01/01/20 1315  BP: 115/73  Pulse: 60  Resp: 18  Temp: (!) 96.3 F (35.7 C)  SpO2: 100%   Filed Weights   01/01/20 1315  Weight: 258 lb 6.4 oz (117.2 kg)   .Body mass index is 35.05 kg/m.  GENERAL:alert, in no acute distress and comfortable SKIN: no acute rashes, no significant lesions EYES: conjunctiva are pink and non-injected, sclera anicteric OROPHARYNX: MMM, no exudates, no oropharyngeal erythema or ulceration NECK: supple, no JVD LYMPH:  no palpable lymphadenopathy in the cervical, axillary or inguinal regions LUNGS: clear to auscultation b/l with normal respiratory effort HEART: regular rate & rhythm ABDOMEN:  normoactive bowel sounds , non tender, not distended. Extremity: no pedal edema PSYCH: alert & oriented x 3 with fluent speech NEURO: no focal motor/sensory deficits  LABORATORY DATA:  I have reviewed the data as listed  . CBC Latest Ref Rng & Units 01/01/2020 12/10/2019 12/09/2019  WBC 4.0 - 10.5 K/uL 7.7 8.6 10.2  Hemoglobin 13.0 - 17.0 g/dL 13.7 13.4 13.8  Hematocrit 39 - 52 % 40.3 39.0 39.5  Platelets 150 - 400 K/uL 236 175 168    . CMP Latest Ref Rng & Units 01/01/2020 12/10/2019 12/09/2019  Glucose 70 - 99 mg/dL 78 101(H) 105(H)  BUN 6 - 20 mg/dL 15 10 11   Creatinine  0.61 - 1.24 mg/dL 1.16 1.02 1.12  Sodium 135 - 145 mmol/L 138 140 139  Potassium 3.5 - 5.1  mmol/L 4.1 4.6 4.0  Chloride 98 - 111 mmol/L 103 107 106  CO2 22 - 32 mmol/L 27 24 26   Calcium 8.9 - 10.3 mg/dL 9.4 8.4(L) 8.8(L)  Total Protein 6.5 - 8.1 g/dL 7.3 - -  Total Bilirubin 0.3 - 1.2 mg/dL 0.4 - -  Alkaline Phos 38 - 126 U/L 83 - -  AST 15 - 41 U/L 22 - -  ALT 0 - 44 U/L 21 - -     RADIOGRAPHIC STUDIES: I have personally reviewed the radiological images as listed and agreed with the findings in the report. DG Chest 2 View  Result Date: 12/17/2019 CLINICAL DATA:  F/U LLL opacity, pulmonary infarct, history of hypertension EXAM: CHEST - 2 VIEW COMPARISON:  12/09/2019 FINDINGS: Normal heart size, mediastinal contours, and pulmonary vascularity. Persistent LEFT basilar infiltrate, increased. No additional infiltrate, pleural effusion, or pneumothorax. Osseous structures unremarkable. IMPRESSION: Increased LEFT basilar infiltrate. Electronically Signed   By: Lavonia Dana M.D.   On: 12/17/2019 09:53   DG Chest 2 View  Result Date: 12/09/2019 CLINICAL DATA:  Chest discomfort since last night; patient states that the pain intensifies when he takes breath in, it feels like when he takes in very cold air. No known cardiopulmonary problems; occasional smoker x 15 years; HTN EXAM: CHEST - 2 VIEW COMPARISON:  04/27/2016 FINDINGS: Small area of opacity at the left lateral lung base best seen on the frontal view, new since the prior exam. Remainder of the lungs is clear. No pleural effusion or pneumothorax. Cardiac silhouette is normal in size. No mediastinal or hilar masses or evidence of adenopathy. Skeletal structures are intact. IMPRESSION: 1. Small area of opacity at the left lateral lung base. This may reflect pneumonia or be due to atelectasis/scarring. No other evidence of acute cardiopulmonary disease. Electronically Signed   By: Lajean Manes M.D.   On: 12/09/2019 16:08   CT Angio Chest W/Cm &/Or  Wo Cm  Result Date: 12/18/2019 CLINICAL DATA:  Pulmonary embolism post treatment EXAM: CT ANGIOGRAPHY CHEST WITH CONTRAST TECHNIQUE: Multidetector CT imaging of the chest was performed using the standard protocol during bolus administration of intravenous contrast. Multiplanar CT image reconstructions and MIPs were obtained to evaluate the vascular anatomy. CONTRAST:  34mL OMNIPAQUE IOHEXOL 350 MG/ML SOLN IV COMPARISON:  12/09/2019 FINDINGS: Cardiovascular: Aorta normal caliber without aneurysm or dissection. Heart unremarkable. No pericardial effusion. Pulmonary arteries patent. No evidence of pulmonary embolism. Pulmonary embolus in LEFT upper lobe on previous exam no longer identified Mediastinum/Nodes: Esophagus unremarkable. Scattered normal sized mediastinal and axillary lymph nodes. No thoracic adenopathy. Base of cervical region normal appearance. Lungs/Pleura: Tiny RIGHT pleural effusion. Atelectasis versus consolidation in lingula and posterior base of RIGHT lower lobe. Remaining lungs clear. No pneumothorax. Upper Abdomen: Unremarkable Musculoskeletal: Unremarkable Review of the MIP images confirms the above findings. IMPRESSION: No evidence of pulmonary embolism. Previously identified pulmonary embolus in LEFT upper lobe no longer seen. Tiny RIGHT pleural effusion. Atelectasis versus consolidation in lingula and posterior base of RIGHT lower lobe. Electronically Signed   By: Lavonia Dana M.D.   On: 12/18/2019 14:04   CT Angio Chest PE W and/or Wo Contrast  Result Date: 12/09/2019 CLINICAL DATA:  Chest pain.  Pleuritic. EXAM: CT ANGIOGRAPHY CHEST WITH CONTRAST TECHNIQUE: Multidetector CT imaging of the chest was performed using the standard protocol during bolus administration of intravenous contrast. Multiplanar CT image reconstructions and MIPs were obtained to evaluate the vascular anatomy. CONTRAST:  153mL OMNIPAQUE IOHEXOL 350 MG/ML SOLN COMPARISON:  12/09/2019 plain film.  No prior CT. FINDINGS:  Cardiovascular: The quality of this exam for evaluation of pulmonary embolism is poor. In addition to motion, the bolus is suboptimally timed, contrast centered in the brachiocephalic veins and SVC. Suspicion of isolated anterior left upper lobe subsegmental pulmonary embolism, including on 86/5, coronal image 79. No central or large lobar pulmonary embolism identified. Normal aortic caliber. Borderline cardiomegaly, without pericardial effusion. Pulmonary artery enlargement, outflow tract 3.2 cm Mediastinum/Nodes: No mediastinal or hilar adenopathy. Lungs/Pleura: Trace bilateral pleural fluid. Mild motion degradation inferiorly. Peripheral predominant mild ground-glass opacities, worse on the left than right. Dense left lower lobe consolidation, including on 105/6. Upper Abdomen: Motion degradation continuing. Normal imaged portions of the liver, spleen, stomach, pancreas, gallbladder, adrenal glands, kidneys. Musculoskeletal: No acute osseous abnormality. Review of the MIP images confirms the above findings. IMPRESSION: 1. Poor quality exam for evaluation of pulmonary embolism. Suspicion of a subsegmental pulmonary embolism to the anterior left upper lobe. Of questionable clinical significance. Otherwise, no evidence of pulmonary embolism to the large lobar level. 2. Subtle peripheral ground-glass opacities which are suspicious for mild COVID-19 pneumonia. 3. Dense consolidation in the left lung base which could represent a focus of infection or infarct if the patient has pulmonary emboli. As this is plain film visible, recommend radiographic follow-up to confirm resolution. 4. Trace bilateral pleural fluid. Electronically Signed   By: Abigail Miyamoto M.D.   On: 12/09/2019 18:10   ECHOCARDIOGRAM COMPLETE  Result Date: 12/10/2019    ECHOCARDIOGRAM REPORT   Patient Name:   QUASHON JESUS Date of Exam: 12/10/2019 Medical Rec #:  [id number]      Height:       74.0 in Accession #:    [id number]     Weight:       262.0 lb  Date of Birth:  1971-01-01      BSA:          2.438 m Patient Age:    36 years       BP:           120/85 mmHg Patient Gender: M              HR:           73 bpm. Exam Location:  Inpatient Procedure: 2D Echo Indications:    pulmonary embolus 415.19  History:        Patient has no prior history of Echocardiogram examinations.                 Risk Factors:Hypertension, Dyslipidemia and Current Smoker.  Sonographer:    Jannett Celestine RDCS (AE) Referring Phys: [id number] VISHAL R PATEL IMPRESSIONS  1. Left ventricular ejection fraction, by estimation, is 60 to 65%. The left ventricle has normal function. The left ventricle has no regional wall motion abnormalities. There is mild concentric left ventricular hypertrophy. Left ventricular diastolic parameters were normal.  2. Right ventricular systolic function is normal. The right ventricular size is moderately enlarged. Tricuspid regurgitation signal is inadequate for assessing PA pressure.  3. The mitral valve is normal in structure. No evidence of mitral valve regurgitation. No evidence of mitral stenosis.  4. The aortic valve is normal in structure. Aortic valve regurgitation is not visualized. No aortic stenosis is present.  5. Aortic dilatation noted. There is mild dilatation of the aortic root measuring 41 mm.  6. The inferior vena cava is dilated in size with <50% respiratory variability, suggesting right atrial pressure of 15 mmHg. FINDINGS  Left Ventricle: Left ventricular ejection fraction, by estimation, is 60 to 65%. The left ventricle has normal function. The left ventricle has no regional wall motion abnormalities. The left ventricular internal cavity size was normal in size. There is  mild concentric left ventricular hypertrophy. Left ventricular diastolic parameters were normal. Normal left ventricular filling pressure. Right Ventricle: The right ventricular size is moderately enlarged. No increase in right ventricular wall thickness. Right ventricular  systolic function is normal. Tricuspid regurgitation signal is inadequate for assessing PA pressure. Left Atrium: Left atrial size was normal in size. Right Atrium: Right atrial size was normal in size. Pericardium: There is no evidence of pericardial effusion. Mitral Valve: The mitral valve is normal in structure. Normal mobility of the mitral valve leaflets. No evidence of mitral valve regurgitation. No evidence of mitral valve stenosis. Tricuspid Valve: The tricuspid valve is normal in structure. Tricuspid valve regurgitation is not demonstrated. No evidence of tricuspid stenosis. Aortic Valve: The aortic valve is normal in structure. Aortic valve regurgitation is not visualized. No aortic stenosis is present. Pulmonic Valve: The pulmonic valve was normal in structure. Pulmonic valve regurgitation is not visualized. No evidence of pulmonic stenosis. Aorta: Aortic dilatation noted. There is mild dilatation of the aortic root measuring 41 mm. Venous: The inferior vena cava is dilated in size with less than 50% respiratory variability, suggesting right atrial pressure of 15 mmHg. IAS/Shunts: No atrial level shunt detected by color flow Doppler.  LEFT VENTRICLE PLAX 2D LVIDd:         4.40 cm  Diastology LVIDs:         3.10 cm  LV e' lateral:   15.30 cm/s LV PW:         1.20 cm  LV E/e' lateral: 4.1 LV IVS:        1.20 cm  LV e' medial:    9.36 cm/s LVOT diam:     2.40 cm  LV E/e' medial:  6.6 LV SV:         83 LV SV Index:   34 LVOT Area:     4.52 cm  RIGHT VENTRICLE TAPSE (M-mode): 1.8 cm LEFT ATRIUM             Index       RIGHT ATRIUM           Index LA diam:        3.80 cm 1.56 cm/m  RA Area:     23.30 cm LA Vol (A2C):   73.8 ml 30.27 ml/m RA Volume:   75.10 ml  30.81 ml/m LA Vol (A4C):   27.4 ml 11.24 ml/m LA Biplane Vol: 46.3 ml 18.99 ml/m  AORTIC VALVE LVOT Vmax:   84.20 cm/s LVOT Vmean:  61.300 cm/s LVOT VTI:    0.184 m  AORTA Ao Root diam: 4.10 cm MITRAL VALVE MV Area (PHT): 3.12 cm    SHUNTS MV  Decel Time: 243 msec    Systemic VTI:  0.18 m MV E velocity: 62.10 cm/s  Systemic Diam: 2.40 cm MV A velocity: 55.70 cm/s MV E/A ratio:  1.11 Fransico Him MD Electronically signed by Fransico Him MD Signature Date/Time: 12/10/2019/9:22:57 AM    Final    VAS [place] LOWER EXTREMITY VENOUS (DVT)  Result Date: 12/10/2019  Lower Venous DVTStudy Indications: Pulmonary embolism, and recent travel.  Comparison Study: No prior study Performing Technologist: Maudry Mayhew MHA, RDMS, RVT, RDCS  Examination Guidelines: A complete evaluation includes B-mode imaging, spectral Doppler, color Doppler, and  power Doppler as needed of all accessible portions of each vessel. Bilateral testing is considered an integral part of a complete examination. Limited examinations for reoccurring indications may be performed as noted. The reflux portion of the exam is performed with the patient in reverse Trendelenburg.  +---------+---------------+---------+-----------+----------+--------------+ RIGHT    CompressibilityPhasicitySpontaneityPropertiesThrombus Aging +---------+---------------+---------+-----------+----------+--------------+ CFV      Full           Yes      Yes                                 +---------+---------------+---------+-----------+----------+--------------+ SFJ      Full                                                        +---------+---------------+---------+-----------+----------+--------------+ FV Prox  Full                                                        +---------+---------------+---------+-----------+----------+--------------+ FV Mid   Full                                                        +---------+---------------+---------+-----------+----------+--------------+ FV DistalFull                                                        +---------+---------------+---------+-----------+----------+--------------+ PFV      Full                                                         +---------+---------------+---------+-----------+----------+--------------+ POP      Full           Yes      Yes                                 +---------+---------------+---------+-----------+----------+--------------+ PTV      Full                                                        +---------+---------------+---------+-----------+----------+--------------+ PERO     Full                                                        +---------+---------------+---------+-----------+----------+--------------+  Gastroc  None                    No                   Acute          +---------+---------------+---------+-----------+----------+--------------+   +---------+---------------+---------+-----------+----------+--------------+ LEFT     CompressibilityPhasicitySpontaneityPropertiesThrombus Aging +---------+---------------+---------+-----------+----------+--------------+ CFV      Full           Yes      Yes                                 +---------+---------------+---------+-----------+----------+--------------+ SFJ      Full                                                        +---------+---------------+---------+-----------+----------+--------------+ FV Prox  Full                                                        +---------+---------------+---------+-----------+----------+--------------+ FV Mid   Full                                                        +---------+---------------+---------+-----------+----------+--------------+ FV DistalFull                                                        +---------+---------------+---------+-----------+----------+--------------+ PFV      Full                                                        +---------+---------------+---------+-----------+----------+--------------+ POP      Full           Yes      Yes                                  +---------+---------------+---------+-----------+----------+--------------+ PTV      Full                                                        +---------+---------------+---------+-----------+----------+--------------+ PERO     Full                                                        +---------+---------------+---------+-----------+----------+--------------+  Summary: RIGHT: - Findings consistent with acute deep vein thrombosis involving the right gastrocnemius veins. - No cystic structure found in the popliteal fossa.  LEFT: - There is no evidence of deep vein thrombosis in the lower extremity.  - No cystic structure found in the popliteal fossa.  *See table(s) above for measurements and observations. Electronically signed by Deitra Mayo MD on 12/10/2019 at 4:04:29 PM.    Final    DG ESOPHAGUS W DOUBLE CM (HD)  Result Date: 12/27/2019 CLINICAL DATA:  Reflux. EXAM: ESOPHOGRAM / BARIUM SWALLOW / BARIUM TABLET STUDY TECHNIQUE: Combined double contrast and single contrast examination performed using effervescent crystals, thick barium liquid, and thin barium liquid. The patient was observed with fluoroscopy swallowing a 13 mm barium sulphate tablet. FLUOROSCOPY TIME:  Fluoroscopy Time:  1 minutes and 0 seconds. Radiation Exposure Index (if provided by the fluoroscopic device): 105 mGy Number of Acquired Spot Images: COMPARISON:  None. FINDINGS: Frontal and lateral views of the hypopharynx while swallowing thick barium are normal. Double contrast imaging of the esophagus is normal without stricture, diverticulum, mass lesion, or mucosal ulceration. Assessment of esophageal motility demonstrates preservation of primary peristalsis on all observed swallows. 13 mm barium tablet passes readily into the stomach when taken with water. IMPRESSION: Normal double contrast barium esophagram. Electronically Signed   By: Misty Stanley M.D.   On: 12/27/2019 14:46    ASSESSMENT & PLAN:   49 yo  with   1) Subsegmental Pulmonary embolism 2) RLE venous DVT PLAN: -Discussed patient's most recent labs from 12/10/2019, all values are WNL except for Glucose at 101, Calcium at 8.4. -Discussed 12/10/2019 CRP at 4.8, LDH at 227, Sed Rate at 26, Fibrinogen at 504, D-Dimer at 2.79. -Discussed 12/09/2019 CT Angio Chest ([id number]) which revealed "1. Poor quality exam for evaluation of pulmonary embolism. Suspicion of a subsegmental pulmonary embolism to the anterior left upper lobe. 2. Subtle peripheral ground-glass opacities which are suspicious for mild COVID-19 pneumonia. 3. Dense consolidation in the left lung base which could represent a focus of infection or infarct if the patient has pulmonary emboli. 4. Trace bilateral pleural fluid."  -Discussed 12/10/2019 Lower Venous DVT Study ([id number]) which revealed "RIGHT: - Findings consistent with acute deep vein thrombosis involving the right gastrocnemius veins. LEFT: - There is no evidence of deep vein thrombosis in the lower extremity." -Discussed 12/10/2019 ECHO ([id number]) which revealed Normal ejection fraction. -Discussed 12/18/2019 CT Angio Chest ([id number]) which revealed "No evidence of pulmonary embolism. Previously identified pulmonary embolus in LEFT upper lobe no longer seen. Tiny RIGHT pleural effusion." -Advised pt that elevated inflammatory markers could have been caused by a pulmonary infarct.  -Advised pt that severe COVID19 infections have been found to be a provoking factor for blood clots. Pt could have had a mild COVID19 infection, but he showed no lab or clinical evidence of infection.  -Advised pt that if there is a modifiable, provoking factor we would continue anticoagulation for 6-9 months before discontinuing.   -Advised pt that smoking is a risk factor for blood clots, but that there is no clear, provoking factor for his blood clots at this time. -Recommend pt begin care with a PCP. [facility] would be  a good fit. -Recommend pt stay up to date with age-appropriate cancer screenings with PCP. -Recommend CT Chest be repeated in 3 months to r/o underlying pulmonary tumors -Recommend pt f/u with Dr. Lamonte Sakai as scheduled. -Continue complete smoking cessation -Continue 5 mg Eliquis BID -Will get labs today -  Refill Eliquis -Will see back in 2 weeks via phone -Will see back in 3 months with labs   FOLLOW UP: Labs today Phone visit with Dr Irene Limbo in 2 weeks RTC with Dr Irene Limbo with labs in 3 months  . Orders Placed This Encounter  Procedures  . CBC with Differential/Platelet    Standing Status:   Future    Number of Occurrences:   1    Standing Expiration Date:   12/31/2020  . CMP ([facility] only)    Standing Status:   Future    Number of Occurrences:   1    Standing Expiration Date:   12/31/2020  . Factor 5 leiden    Standing Status:   Future    Number of Occurrences:   1    Standing Expiration Date:   12/31/2020  . Prothrombin gene mutation    Standing Status:   Future    Number of Occurrences:   1    Standing Expiration Date:   12/31/2020  . Antithrombin III    Standing Status:   Future    Number of Occurrences:   1    Standing Expiration Date:   12/31/2020  . Protein S, total and free    Standing Status:   Future    Number of Occurrences:   1    Standing Expiration Date:   12/31/2020  . Protein C activity    Standing Status:   Future    Number of Occurrences:   1    Standing Expiration Date:   12/31/2020  . Protein C, total    Standing Status:   Future    Number of Occurrences:   1    Standing Expiration Date:   12/31/2020  . Cardiolipin antibodies, IgG, IgM, IgA    Standing Status:   Future    Number of Occurrences:   1    Standing Expiration Date:   12/31/2020  . Beta-2-glycoprotein i abs, IgG/M/A    Standing Status:   Future    Number of Occurrences:   1    Standing Expiration Date:   12/31/2020  . Lupus anticoagulant panel    Standing Status:   Future    Number of  Occurrences:   1    Standing Expiration Date:   12/31/2020    All of the patients questions were answered with apparent satisfaction. The patient knows to call the clinic with any problems, questions or concerns.  I spent 45 mins counseling the patient face to face. The total time spent in the appointment was 60 minutes and more than 50% was on counseling and direct patient cares.    Sullivan Lone MD [place] AAHIVMS [facility] Hematology/Oncology Physician [facility]  (Office):       [phone] (Work cell):  [phone] (Fax):           [phone]  01/01/2020 5:02 PM  I, Yevette Edwards, am acting as a scribe for Dr. Sullivan Lone.   .I have reviewed the above documentation for accuracy and completeness, and I agree with the above. Brunetta Genera MD

## 2020-01-02 LAB — PROTEIN C, TOTAL: Protein C, Total: 89 % (ref 60–150)

## 2020-01-03 LAB — LUPUS ANTICOAGULANT PANEL
DRVVT: 42.4 s (ref 0.0–47.0)
PTT Lupus Anticoagulant: 32.2 s (ref 0.0–51.9)

## 2020-01-03 LAB — CARDIOLIPIN ANTIBODIES, IGG, IGM, IGA
Anticardiolipin IgA: <9 APL U/mL (ref 0–11)
Anticardiolipin IgG: <9 GPL U/mL (ref 0–14)
Anticardiolipin IgM: <9 MPL U/mL (ref 0–12)

## 2020-01-03 LAB — BETA-2-GLYCOPROTEIN I ABS, IGG/M/A
Beta-2 Glyco I IgG: <9 GPI IgG units (ref 0–20)
Beta-2-Glycoprotein I IgA: <9 GPI IgA units (ref 0–25)
Beta-2-Glycoprotein I IgM: <9 GPI IgM units (ref 0–32)

## 2020-01-03 LAB — PROTEIN C ACTIVITY: Protein C Activity: 96 % (ref 73–180)

## 2020-01-03 LAB — PROTEIN S, TOTAL AND FREE
Protein S Ag, Free: 108 % (ref 61–136)
Protein S Ag, Total: 101 % (ref 60–150)

## 2020-01-06 LAB — PROTHROMBIN GENE MUTATION

## 2020-01-06 LAB — FACTOR 5 LEIDEN

## 2020-01-08 ENCOUNTER — Ambulatory Visit: Admitting: Pulmonary Disease

## 2020-01-13 ENCOUNTER — Encounter: Payer: Self-pay | Admitting: Emergency Medicine

## 2020-01-13 ENCOUNTER — Ambulatory Visit (INDEPENDENT_AMBULATORY_CARE_PROVIDER_SITE_OTHER): Admitting: Emergency Medicine

## 2020-01-13 ENCOUNTER — Other Ambulatory Visit: Payer: Self-pay

## 2020-01-13 DIAGNOSIS — I2699 Other pulmonary embolism without acute cor pulmonale: Secondary | ICD-10-CM | POA: Diagnosis not present

## 2020-01-13 DIAGNOSIS — I272 Pulmonary hypertension, unspecified: Secondary | ICD-10-CM

## 2020-01-13 NOTE — Assessment & Plan Note (Signed)
We reviewed your CT scans of the chest today. Your blood work to evaluate for a hypercoagulable state was all reassuring.  Follow this up with Dr Irene Limbo We will plan to continue your Eliquis 5 mg twice a day for at least 6 months total.  We should be able to stop at that time.  We can speak with Dr Irene Limbo about whether there is any indication or benefit from staying on lower dose Eliquis going forward to prevent clot.  This may not be necessary since we believe your blood clot and pulmonary embolism were provoked. Be sure to avoid long stationary travel.  Get up and walk at least every 2 hours. Okay to go back to your usual exercise routine We will plan to repeat your echocardiogram in March to evaluate your pulmonary pressures and right heart function. Follow with Dr Lamonte Sakai in March to review the studies.  Please call if you have any new symptoms or problems so that we can meet sooner.

## 2020-01-13 NOTE — Addendum Note (Signed)
Addended by: Gavin Potters R on: 01/13/2020 12:03 PM   Modules accepted: Orders

## 2020-01-13 NOTE — Patient Instructions (Signed)
We reviewed your CT scans of the chest today. Your blood work to evaluate for a hypercoagulable state was all reassuring.  Follow this up with Dr Irene Limbo We will plan to continue your Eliquis 5 mg twice a day for at least 6 months total.  We should be able to stop at that time.  We can speak with Dr Irene Limbo about whether there is any indication or benefit from staying on lower dose Eliquis going forward to prevent clot.  This may not be necessary since we believe your blood clot and pulmonary embolism were provoked. Be sure to avoid long stationary travel.  Get up and walk at least every 2 hours. Okay to go back to your usual exercise routine We will plan to repeat your CT scan of the chest in March to follow resolving pulmonary infarcts We will plan to repeat your echocardiogram in March to evaluate your pulmonary pressures and right heart function. Follow with Dr Lamonte Sakai in March to review the studies.  Please call if you have any new symptoms or problems so that we can meet sooner.

## 2020-01-13 NOTE — Assessment & Plan Note (Signed)
Normal RV function but the chamber was slightly enlarged.  PAP could not be estimated.  I think we should repeat his echocardiogram after about 6 months for completeness to ensure good RV function.

## 2020-01-13 NOTE — Assessment & Plan Note (Signed)
We will plan to repeat your CT scan of the chest in March to follow resolving pulmonary infarcts

## 2020-01-13 NOTE — Progress Notes (Signed)
Subjective:    Patient ID: [place], male    DOB: 1970-06-25, 49 y.o.   MRN: [id number]  HPI 49 year old gentleman, former smoker (25 pack years, occasional THC), diagnosed with right lower extremity DVT and pulmonary embolism with consolidation in his lingula concerning for associated pulmonary infarct.  There was also some subtle peripheral groundglass, COVID-19 testing negative.  He continues to have some chest discomfort, some characteristics suggestive of GERD.  Repeat CT angiography was performed on 12/18/2019 which I reviewed, showed no evidence of residual PE, atelectatic change in the affected area of the lingula, new airspace disease in the posterior right lower lobe.  He is doing well. Pain has improved. He had some cough and saw some blood earlier in the course. Now his his exercise tolerance is back to normal - very active, walks and does elliptical training.   Esophagram 12/27/2019 reviewed by me, with normal. Hypercoagulable lab profile performed by Dr Irene Limbo 9/29 - for lupus anticoagulant, anticardiolipin antibodies.  Protein C and S are normal.  AT III level normal.  Genetics negative for factor II or factor V Leiden mutation. Echocardiogram 12/10/2019 reviewed by me, showed normal LVEF 60-65%, mild concentric LV hypertrophy with normal diastolic parameters.  RV size moderately enlarged but with normal systolic function.  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   Family History  Problem Relation Age of Onset   Stroke Father    Throat cancer Father      Social History   Socioeconomic History   Marital status: Married    Spouse name: Not on file   Number of children: Not on file   Years of education: Not on file   Highest education level: Not on file   Occupational History   Not on file  Tobacco Use   Smoking status: Former Smoker    Packs/day: 1.00    Years: 25.00    Pack years: 25.00    Types: Cigarettes, Cigars    Quit date: 12/09/2019    Years since quitting: 0.0   Smokeless tobacco: Never Used  Vaping Use   Vaping Use: Never used  Substance and Sexual Activity   Alcohol use: Yes   Drug use: Never   Sexual activity: Not on file  Other Topics Concern   Not on file  Social History Narrative   Not on file   Social Determinants of Health   Financial Resource Strain:    Difficulty of Paying Living Expenses: Not on file  Food Insecurity:    Worried About Charity fundraiser in the Last Year: Not on file   [place] in the Last Year: Not on file  Transportation Needs:    Lack of Transportation (Medical): Not on file   Lack of Transportation (Non-Medical): Not on file  Physical Activity:    Days of Exercise per Week: Not on file   Minutes of Exercise per Session: Not on file  Stress:    Feeling of Stress : Not on file  Social Connections:    Frequency of Communication with Friends and Family: Not on file   Frequency of Social Gatherings with Friends and Family: Not on file   Attends Religious Services: Not on file   Active Member  of Clubs or Organizations: Not on file   Attends Archivist Meetings: Not on file   Marital Status: Not on file  Intimate Partner Violence:    Fear of Current or Ex-Partner: Not on file   Emotionally Abused: Not on file   Physically Abused: Not on file   Sexually Abused: Not on file     No Known Allergies   Outpatient Medications Prior to Visit  Medication Sig Dispense Refill   apixaban (ELIQUIS) 5 MG TABS tablet Take 1 tablet (5 mg total) by mouth 2 (two) times daily. 60 tablet 4   buPROPion (WELLBUTRIN XL) 150 MG 24 hr tablet Take 150 mg by mouth every morning.     gabapentin (NEURONTIN) 100 MG capsule Take 100 mg by mouth 3 (three) times  daily.     lisinopril (ZESTRIL) 10 MG tablet Take 10 mg by mouth daily.     pantoprazole (PROTONIX) 40 MG tablet Take 40 mg by mouth daily.     rosuvastatin (CRESTOR) 10 MG tablet Take 10 mg by mouth daily.     traMADol (ULTRAM) 50 MG tablet 1/2-1 tab every 6 hours as needed for pain x 5 days 20 tablet 0   valACYclovir (VALTREX) 500 MG tablet Take 500 mg by mouth daily.     albuterol (VENTOLIN HFA) 108 (90 Base) MCG/ACT inhaler Inhale 2 puffs into the lungs as needed for wheezing or shortness of breath. (Patient not taking: Reported on 12/17/2019)     No facility-administered medications prior to visit.         Objective:   Physical Exam  Vitals:   01/13/20 1005  BP: 118/68  Pulse: 82  Temp: 98 F (36.7 C)  TempSrc: Oral  SpO2: 98%  Weight: 259 lb 6.4 oz (117.7 kg)  Height: 6' (1.829 m)   Gen: Pleasant, well-nourished, in no distress,  normal affect  ENT: No lesions,  mouth clear,  oropharynx clear, no postnasal drip  Neck: No JVD, no stridor  Lungs: No use of accessory muscles, no crackles or wheezing on normal respiration, no wheeze on forced expiration  Cardiovascular: RRR, heart sounds normal, no murmur or gallops, no peripheral edema  Musculoskeletal: No deformities, no cyanosis or clubbing  Neuro: alert, awake, non focal  Skin: Warm, no lesions or rash      Assessment & Plan:  Pulmonary embolus (HCC) We reviewed your CT scans of the chest today. Your blood work to evaluate for a hypercoagulable state was all reassuring.  Follow this up with Dr Irene Limbo We will plan to continue your Eliquis 5 mg twice a day for at least 6 months total.  We should be able to stop at that time.  We can speak with Dr Irene Limbo about whether there is any indication or benefit from staying on lower dose Eliquis going forward to prevent clot.  This may not be necessary since we believe your blood clot and pulmonary embolism were provoked. Be sure to avoid long stationary travel.  Get up  and walk at least every 2 hours. Okay to go back to your usual exercise routine We will plan to repeat your echocardiogram in March to evaluate your pulmonary pressures and right heart function. Follow with Dr Lamonte Sakai in March to review the studies.  Please call if you have any new symptoms or problems so that we can meet sooner.  Pulmonary infarct [facility]) We will plan to repeat your CT scan of the chest in March to follow resolving pulmonary infarcts  Mild pulmonary hypertension (HCC) Normal RV function but the chamber was slightly enlarged.  PAP could not be estimated.  I think we should repeat his echocardiogram after about 6 months for completeness to ensure good RV function.   Baltazar Apo, MD, PhD 01/13/2020, 10:50 AM [facility] [phone] or if no answer [phone]

## 2020-01-13 NOTE — Addendum Note (Signed)
Addended by: Gavin Potters R on: 01/13/2020 11:33 AM   Modules accepted: Orders

## 2020-01-16 ENCOUNTER — Inpatient Hospital Stay: Attending: Hematology | Admitting: Hematology

## 2020-01-16 DIAGNOSIS — Z79899 Other long term (current) drug therapy: Secondary | ICD-10-CM | POA: Diagnosis not present

## 2020-01-16 DIAGNOSIS — Z7901 Long term (current) use of anticoagulants: Secondary | ICD-10-CM | POA: Diagnosis not present

## 2020-01-16 DIAGNOSIS — I82461 Acute embolism and thrombosis of right calf muscular vein: Secondary | ICD-10-CM

## 2020-01-16 DIAGNOSIS — E785 Hyperlipidemia, unspecified: Secondary | ICD-10-CM | POA: Insufficient documentation

## 2020-01-16 DIAGNOSIS — F17210 Nicotine dependence, cigarettes, uncomplicated: Secondary | ICD-10-CM | POA: Diagnosis not present

## 2020-01-16 DIAGNOSIS — D6859 Other primary thrombophilia: Secondary | ICD-10-CM | POA: Insufficient documentation

## 2020-01-16 DIAGNOSIS — Z86711 Personal history of pulmonary embolism: Secondary | ICD-10-CM | POA: Insufficient documentation

## 2020-01-16 DIAGNOSIS — I2699 Other pulmonary embolism without acute cor pulmonale: Secondary | ICD-10-CM

## 2020-01-16 DIAGNOSIS — Z86718 Personal history of other venous thrombosis and embolism: Secondary | ICD-10-CM | POA: Insufficient documentation

## 2020-01-16 DIAGNOSIS — I10 Essential (primary) hypertension: Secondary | ICD-10-CM | POA: Diagnosis not present

## 2020-01-16 DIAGNOSIS — F1290 Cannabis use, unspecified, uncomplicated: Secondary | ICD-10-CM | POA: Insufficient documentation

## 2020-01-16 NOTE — Progress Notes (Signed)
HEMATOLOGY/ONCOLOGY CONSULTATION NOTE  Date of Service: 01/16/2020  Patient Care Team: Pcp, No as PCP - General  CHIEF COMPLAINTS/PURPOSE OF CONSULTATION:  PE/DVT  HISTORY OF PRESENTING ILLNESS:   Casey Park is a wonderful 49 y.o. male who has been referred to [place] for evaluation and management of pulmonary embolism/deep vein thrombosis. The pt reports that he is doing well overall.   The pt reports that he was diagnosed with HTN and placed on Lisinopril 7-8 years ago, he does not remember ever being diagnosed with HLD, but was placed on Crestor preventatively. He was also taking a daily baby Aspirin at the time of his PE/DVT. Pt has a history of acid reflux and has been using Pantoprazole as needed. He has no prior history of blood clots.   Pt had a Colonoscopy nearly 6 years ago, where several adenomatous polyps were found. He had a follow up Colonoscopy the next year and was told to follow up in five years. Pt is scheduled for a repeat Colonoscopy next year. Pt denies any GI symptoms.   About a month ago pt did a two mile run and began to notice swelling in his left leg. A few days later he began experiencing a pain in his left scapula. This occurred on a Friday and he spent the following weekend at [facility] with some friends. During the weekend he played golf and participated in other activities with no issue. On Sunday night he began to feel painful chest pressure when laying down. This improved when he sat up. Pt denies sweating, shortness of breath, or palpitations at the time. Pt took Pantoprazole as he was concerned that it was reflux. His symptoms resolved the next day, but he came to the [place] ED after returning home as a precaution. While in the hospital they completed CT scans and US Venous studies revealing his PE and right-sided DVT. After he was given anticoagulants he began to feel short of breath. He was discharged on Eliquis. Pt continued having sharp, stabbing  pain in his back when laying down at night after discharge. Pt began smoking marijuana every couple of days, which improved his discomfort. He is not currently experiencing any chest pain, back pain, or shortness of breath. He is able to walk 5 miles at least 6 days per week. Pt coughed up dark red blood nearly a week ago. Pt is scheduled to see Dr. Lamonte Sakai on 01/13/20.  He has had no fevers, chills, night sweats, unusual fatigue, or unexpected weight loss and denies taking any steroids or being on any hormonal therapies prior to his clot. Pt was experiencing a significant amount of stress prior to this event. Pt completed his vaccination against COVID19 in April. He denies any recent cough, cold, or any other respiratory symptoms.  Of note prior to the patient's visit today, pt has had CT Angio Chest ([id number]) completed on 12/09/2019 with results revealing "1. Poor quality exam for evaluation of pulmonary embolism. Suspicion of a subsegmental pulmonary embolism to the anterior left upper lobe. Of questionable clinical significance. Otherwise, no evidence of pulmonary embolism to the large lobar level. 2. Subtle peripheral ground-glass opacities which are suspicious for mild COVID-19 pneumonia. 3. Dense consolidation in the left lung base which could represent a focus of infection or infarct if the patient has pulmonary emboli. As this is plain film visible, recommend radiographic follow-up to confirm resolution. 4. Trace bilateral pleural fluid."   Of note since the patient's last visit,  pt has had Lower Venous DVT Study ([id number]) completed on 12/10/2019 with results revealing RIGHT: - Findings consistent with acute deep vein thrombosis involving the right gastrocnemius veins. - No cystic structure found in the popliteal fossa. LEFT: - There is no evidence of deep vein thrombosis in the lower extremity. - No cystic structure found in the popliteal fossa."  Of note since the patient's last visit, pt  has had ECHO completed on 12/10/2019 with results revealing Normal ejection fraction.  Of note since the patient's last visit, pt has had CT Angio Chest ([id number]) completed on 12/18/2019 with results revealing "No evidence of pulmonary embolism. Previously identified pulmonary embolus in LEFT upper lobe no longer seen. Tiny RIGHT pleural effusion. Atelectasis versus consolidation in lingula and posterior base of RIGHT lower lobe."  Most recent lab results (12/10/2019) of CBC is as follows: all values are WNL except for Glucose at 101, Calcium at 8.4. 12/10/2019 CRP at 4.8 12/10/2019 LDH at 227 12/10/2019 Sed Rate at 26 12/10/2019 Fibrinogen at 504 12/10/2019 D-Dimer at 2.79  On review of systems, pt reports hemoptysis and denies unexpected weight loss, abdominal pain, SOB, chest pain, back pain, diarrhea, constipation and any other symptoms.   On PMHx the pt reports GERD, PTSD, HTN, PE, DVT. On Social Hx the pt reports that at his heaviest he smoked about 3/4 ppd but quit on 12/09/19. On Family Hx the pt reports that he has no family history of bleeding or blood clotting disorders.   INTERVAL HISTORY:  I connected with  Casey Park on 01/16/20 by telephone and verified that I am speaking with the correct person using two identifiers.   I discussed the limitations of evaluation and management by telemedicine. The patient expressed understanding and agreed to proceed.  Other persons participating in the visit and their role in the encounter:        -Yevette Edwards, Medical Scribe  Patient's location: Home Provider's location: [facility] [employer] is a wonderful 49 y.o. male who is here for evaluation and management of pulmonary embolism/deep vein thrombosis. The patient's last visit with [place] was on 01/01/2020. The pt reports that he is doing well overall.  The pt reports that he has continued his cigarette smoking cessation. He is still occasionally smoking marijuana.  Pt continues to walk 5 miles per day. He denies any issues taking Eliquis at this time. Pt had a f/u with Dr. Lamonte Sakai earlier this week. They plan on completing a repeat CT in March.   Lab results (01/01/20) of CBC w/diff and CMP is as follows: all values are WNL. 01/01/2020 Beta-2 Glyco I IgG at <9, Beta-2-Glycoprotein I IgM at <9, Beta-2-Glycoprotein I IgA at <9. 01/01/2020 Anticardiolipin IgG at <9, Anticardiolipin IgM at <9, Anticardiolipin IgA at <9 01/01/2020 Protein S Ag, Total at 101, Protein S Ag, Free at 108 01/01/2020 PTT Lupus Anticoagulant at 32.2, dRVVT at 42.4 01/01/2020 Prothrombin gene mutation is Negative  01/01/2020 Factor 5 leiden is Negative 01/01/2020 AntiThromb III Func at 100 01/01/2020 Protein C Activity at 96 01/01/2020 Protein C Total 89  On review of systems, pt denies chest pain, SOB and any other symptoms.   MEDICAL HISTORY:  Past Medical History:  Diagnosis Date   GERD (gastroesophageal reflux disease)    Hyperlipidemia    Hypertension    PTSD (post-traumatic stress disorder)     SURGICAL HISTORY: Past Surgical History:  Procedure Laterality Date   ANTERIOR CRUCIATE LIGAMENT REPAIR  FOOT SURGERY     NOSE SURGERY      SOCIAL HISTORY: Social History   Socioeconomic History   Marital status: Married    Spouse name: Not on file   Number of children: Not on file   Years of education: Not on file   Highest education level: Not on file  Occupational History   Not on file  Tobacco Use   Smoking status: Former Smoker    Packs/day: 1.00    Years: 25.00    Pack years: 25.00    Types: Cigarettes, Cigars    Quit date: 12/09/2019    Years since quitting: 0.1   Smokeless tobacco: Never Used  Vaping Use   Vaping Use: Never used  Substance and Sexual Activity   Alcohol use: Yes   Drug use: Never   Sexual activity: Not on file  Other Topics Concern   Not on file  Social History Narrative   Not on file   Social Determinants  of Health   Financial Resource Strain:    Difficulty of Paying Living Expenses: Not on file  Food Insecurity:    Worried About Charity fundraiser in the Last Year: Not on file   [place] in the Last Year: Not on file  Transportation Needs:    Lack of Transportation (Medical): Not on file   Lack of Transportation (Non-Medical): Not on file  Physical Activity:    Days of Exercise per Week: Not on file   Minutes of Exercise per Session: Not on file  Stress:    Feeling of Stress : Not on file  Social Connections:    Frequency of Communication with Friends and Family: Not on file   Frequency of Social Gatherings with Friends and Family: Not on file   Attends Religious Services: Not on file   Active Member of Clubs or Organizations: Not on file   Attends Archivist Meetings: Not on file   Marital Status: Not on file  Intimate Partner Violence:    Fear of Current or Ex-Partner: Not on file   Emotionally Abused: Not on file   Physically Abused: Not on file   Sexually Abused: Not on file    FAMILY HISTORY: Family History  Problem Relation Age of Onset   Stroke Father    Throat cancer Father     ALLERGIES:  has No Known Allergies.  MEDICATIONS:  Current Outpatient Medications  Medication Sig Dispense Refill   albuterol (VENTOLIN HFA) 108 (90 Base) MCG/ACT inhaler Inhale 2 puffs into the lungs as needed for wheezing or shortness of breath. (Patient not taking: Reported on 12/17/2019)     apixaban (ELIQUIS) 5 MG TABS tablet Take 1 tablet (5 mg total) by mouth 2 (two) times daily. 60 tablet 4   buPROPion (WELLBUTRIN XL) 150 MG 24 hr tablet Take 150 mg by mouth every morning.     gabapentin (NEURONTIN) 100 MG capsule Take 100 mg by mouth 3 (three) times daily.     lisinopril (ZESTRIL) 10 MG tablet Take 10 mg by mouth daily.     pantoprazole (PROTONIX) 40 MG tablet Take 40 mg by mouth daily.     rosuvastatin (CRESTOR) 10 MG tablet Take 10 mg  by mouth daily.     traMADol (ULTRAM) 50 MG tablet 1/2-1 tab every 6 hours as needed for pain x 5 days 20 tablet 0   valACYclovir (VALTREX) 500 MG tablet Take 500 mg by mouth daily.  No current facility-administered medications for this visit.    REVIEW OF SYSTEMS:   A 10+ POINT REVIEW OF SYSTEMS WAS OBTAINED including neurology, dermatology, psychiatry, cardiac, respiratory, lymph, extremities, GI, GU, Musculoskeletal, constitutional, breasts, reproductive, HEENT.  All pertinent positives are noted in the HPI.  All others are negative.   PHYSICAL EXAMINATION: ECOG PERFORMANCE STATUS: 0 - Asymptomatic  . There were no vitals filed for this visit. There were no vitals filed for this visit. .There is no height or weight on file to calculate BMI.  Telehealth visit  LABORATORY DATA:  I have reviewed the data as listed  . CBC Latest Ref Rng & Units 01/01/2020 12/10/2019 12/09/2019  WBC 4.0 - 10.5 K/uL 7.7 8.6 10.2  Hemoglobin 13.0 - 17.0 g/dL 13.7 13.4 13.8  Hematocrit 39 - 52 % 40.3 39.0 39.5  Platelets 150 - 400 K/uL 236 175 168    . CMP Latest Ref Rng & Units 01/01/2020 12/10/2019 12/09/2019  Glucose 70 - 99 mg/dL 78 101(H) 105(H)  BUN 6 - 20 mg/dL 15 10 11   Creatinine 0.61 - 1.24 mg/dL 1.16 1.02 1.12  Sodium 135 - 145 mmol/L 138 140 139  Potassium 3.5 - 5.1 mmol/L 4.1 4.6 4.0  Chloride 98 - 111 mmol/L 103 107 106  CO2 22 - 32 mmol/L 27 24 26   Calcium 8.9 - 10.3 mg/dL 9.4 8.4(L) 8.8(L)  Total Protein 6.5 - 8.1 g/dL 7.3 - -  Total Bilirubin 0.3 - 1.2 mg/dL 0.4 - -  Alkaline Phos 38 - 126 U/L 83 - -  AST 15 - 41 U/L 22 - -  ALT 0 - 44 U/L 21 - -     RADIOGRAPHIC STUDIES: I have personally reviewed the radiological images as listed and agreed with the findings in the report. CT Angio Chest W/Cm &/Or Wo Cm  Result Date: 12/18/2019 CLINICAL DATA:  Pulmonary embolism post treatment EXAM: CT ANGIOGRAPHY CHEST WITH CONTRAST TECHNIQUE: Multidetector CT imaging of the chest was  performed using the standard protocol during bolus administration of intravenous contrast. Multiplanar CT image reconstructions and MIPs were obtained to evaluate the vascular anatomy. CONTRAST:  56mL OMNIPAQUE IOHEXOL 350 MG/ML SOLN IV COMPARISON:  12/09/2019 FINDINGS: Cardiovascular: Aorta normal caliber without aneurysm or dissection. Heart unremarkable. No pericardial effusion. Pulmonary arteries patent. No evidence of pulmonary embolism. Pulmonary embolus in LEFT upper lobe on previous exam no longer identified Mediastinum/Nodes: Esophagus unremarkable. Scattered normal sized mediastinal and axillary lymph nodes. No thoracic adenopathy. Base of cervical region normal appearance. Lungs/Pleura: Tiny RIGHT pleural effusion. Atelectasis versus consolidation in lingula and posterior base of RIGHT lower lobe. Remaining lungs clear. No pneumothorax. Upper Abdomen: Unremarkable Musculoskeletal: Unremarkable Review of the MIP images confirms the above findings. IMPRESSION: No evidence of pulmonary embolism. Previously identified pulmonary embolus in LEFT upper lobe no longer seen. Tiny RIGHT pleural effusion. Atelectasis versus consolidation in lingula and posterior base of RIGHT lower lobe. Electronically Signed   By: Lavonia Dana M.D.   On: 12/18/2019 14:04   DG ESOPHAGUS W DOUBLE CM (HD)  Result Date: 12/27/2019 CLINICAL DATA:  Reflux. EXAM: ESOPHOGRAM / BARIUM SWALLOW / BARIUM TABLET STUDY TECHNIQUE: Combined double contrast and single contrast examination performed using effervescent crystals, thick barium liquid, and thin barium liquid. The patient was observed with fluoroscopy swallowing a 13 mm barium sulphate tablet. FLUOROSCOPY TIME:  Fluoroscopy Time:  1 minutes and 0 seconds. Radiation Exposure Index (if provided by the fluoroscopic device): 105 mGy Number of Acquired Spot Images: COMPARISON:  None. FINDINGS: Frontal and lateral views of the hypopharynx while swallowing thick barium are normal. Double  contrast imaging of the esophagus is normal without stricture, diverticulum, mass lesion, or mucosal ulceration. Assessment of esophageal motility demonstrates preservation of primary peristalsis on all observed swallows. 13 mm barium tablet passes readily into the stomach when taken with water. IMPRESSION: Normal double contrast barium esophagram. Electronically Signed   By: Misty Stanley M.D.   On: 12/27/2019 14:46    ASSESSMENT & PLAN:   49 yo with   1) Subsegmental Pulmonary embolism 2) RLE venous DVT PLAN: -Discussed pt labwork, 01/01/20; blood counts & chemistries are nml; Lupus anticoagulant panel, Beta-2-glycoprotein, Cardiolipin antibodies, Protein S Ag, Protein S Ag, Free, Protein C Total, Protein C Activity, AntiThromb III Func are WNL; Prothrombin gene mutation & Factor 5 leiden are negative. -Advised pt that there were no genetic or autoimmune risk factors for his blood clots. Advised pt that smoking was certainly a risk factor.  -Advised pt that extended car travel, infection, and increased stress could also be softer risk factors for blood clots.  -Recommend pt stay up to date with age-appropriate cancer screenings with his PCP. -Recommend pt continue full dose Eliquis BID for 6 months - plan to complete 1 year of low-dose Eliquis after this. Aspirin has not been protective.  -Recommend continued smoking cessation -Recommend pt f/u for CT Chest as scheduled -Recommend pt f/u with Dr. Lamonte Sakai as scheduled -Will see back in 6 months with labs   FOLLOW UP: RTC with Dr Irene Limbo with labs in 6 months   The total time spent in the appt was 20 minutes and more than 50% was on counseling and direct patient cares.  All of the patient's questions were answered with apparent satisfaction. The patient knows to call the clinic with any problems, questions or concerns.    Sullivan Lone MD [place] AAHIVMS [facility] Hematology/Oncology Physician [facility]  (Office):        [phone] (Work cell):  [phone] (Fax):           [phone]  01/16/2020 3:34 PM  I, Yevette Edwards, am acting as a scribe for Dr. Sullivan Lone.   .I have reviewed the above documentation for accuracy and completeness, and I agree with the above. Brunetta Genera MD

## 2020-02-03 ENCOUNTER — Telehealth: Payer: Self-pay | Admitting: Emergency Medicine

## 2020-02-03 ENCOUNTER — Ambulatory Visit (INDEPENDENT_AMBULATORY_CARE_PROVIDER_SITE_OTHER)
Admission: RE | Admit: 2020-02-03 | Discharge: 2020-02-03 | Disposition: A | Discharge status: Home / Self Care | Source: Ambulatory Visit | Attending: Emergency Medicine | Admitting: Emergency Medicine

## 2020-02-03 ENCOUNTER — Other Ambulatory Visit: Payer: Self-pay

## 2020-02-03 ENCOUNTER — Other Ambulatory Visit (INDEPENDENT_AMBULATORY_CARE_PROVIDER_SITE_OTHER)

## 2020-02-03 DIAGNOSIS — R0781 Pleurodynia: Secondary | ICD-10-CM

## 2020-02-03 LAB — BASIC METABOLIC PANEL
BUN: 14 mg/dL (ref 6–23)
CO2: 25 mEq/L (ref 19–32)
Calcium: 9 mg/dL (ref 8.4–10.5)
Chloride: 108 mEq/L (ref 96–112)
Creatinine, Ser: 1.16 mg/dL (ref 0.40–1.50)
GFR: 73.64 mL/min (ref >60.00)
Glucose, Bld: 107 mg/dL — ABNORMAL HIGH (ref 70–99)
Potassium: 4.5 mEq/L (ref 3.5–5.1)
Sodium: 138 mEq/L (ref 135–145)

## 2020-02-03 IMAGING — CT CT ANGIO CHEST
2 of 8 series · 18 of 46 positions shown · IV contrast (OMNIPAQUE 350)
Comparison: Chest CTA [DATE].

CLINICAL DATA: 49-year-old male with history of recent plane travel
presenting with new onset of right-sided calf pain and pleuritic
chest pain yesterday. Suspected pulmonary embolism (high
probability).

EXAM:
CT ANGIOGRAPHY CHEST WITH CONTRAST
TECHNIQUE: Multidetector CT imaging of the chest was performed using the
standard protocol during bolus administration of intravenous
contrast. Multiplanar CT image reconstructions and MIPs were
obtained to evaluate the vascular anatomy.
CONTRAST:  80mL OMNIPAQUE IOHEXOL 350 MG/ML SOLN

[Series 5: thins · axial · 0.72mm/px · z∈[-336,-64]mm · 15 of 305 slices shown]
[im 17/305  lung]
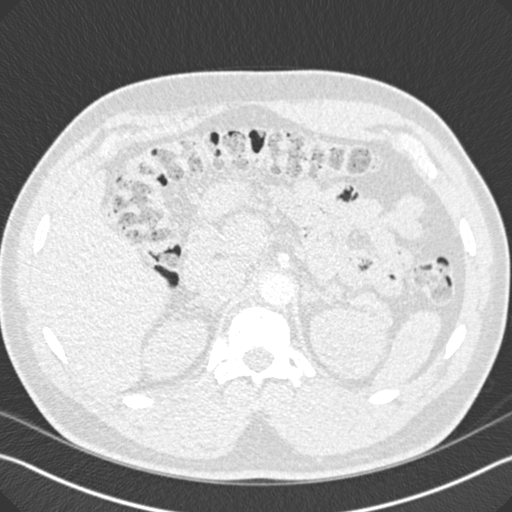
[im 34/305  soft-tissue]
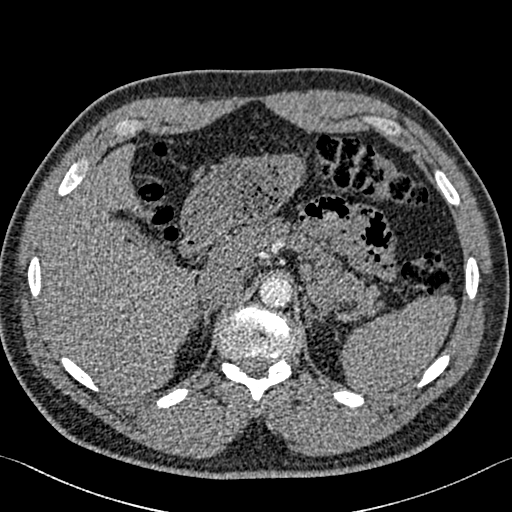
[im 51/305  lung]
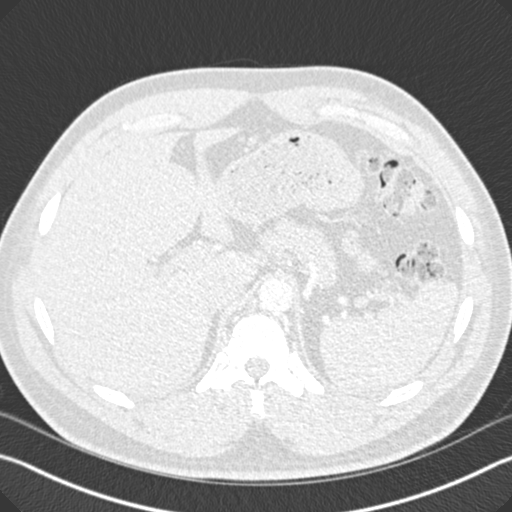
[im 68/305  soft-tissue]
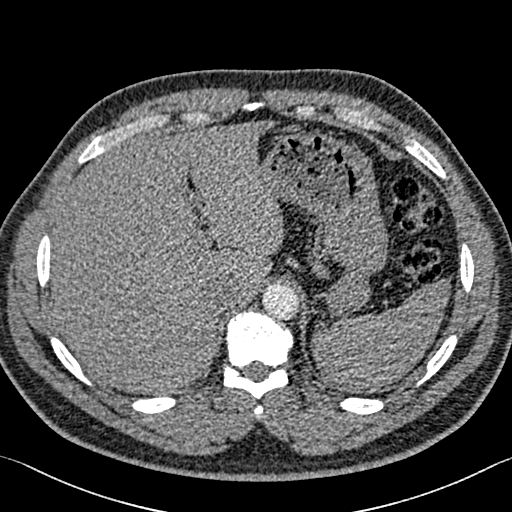
[im 102/305  lung]
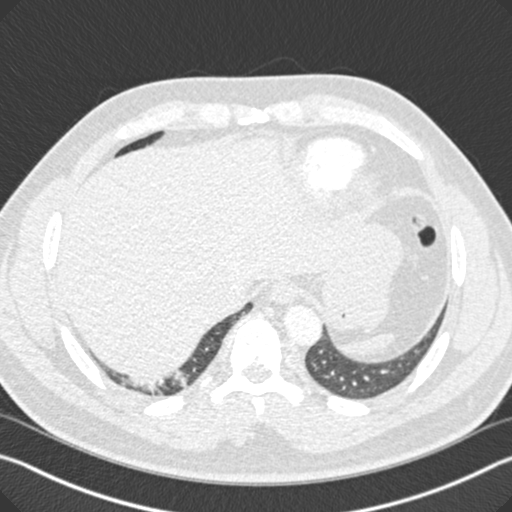
[im 119/305  soft-tissue]
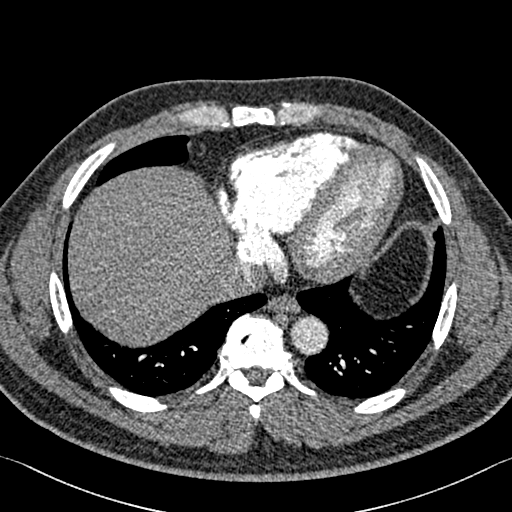
[im 136/305  lung]
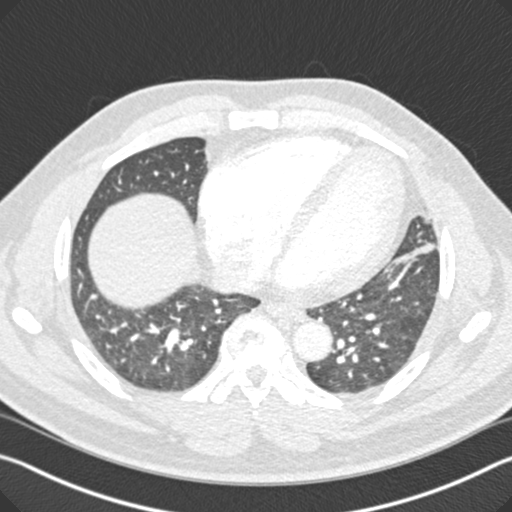
[im 153/305  soft-tissue]
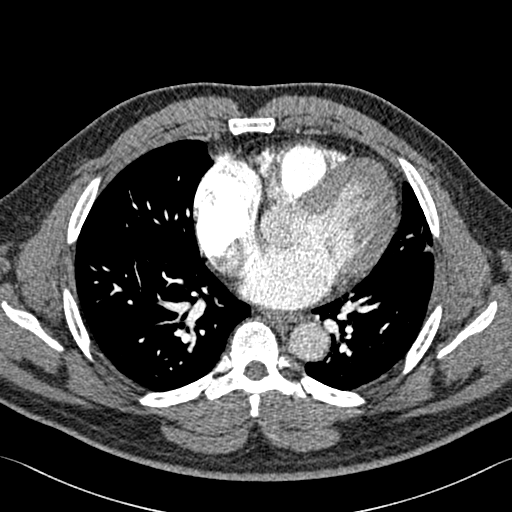
[im 169/305  lung]
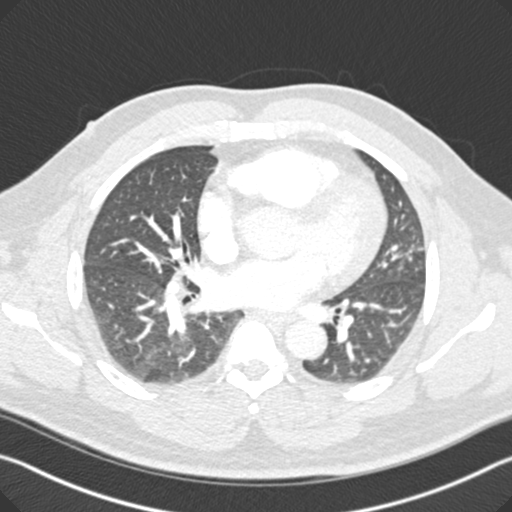
[im 186/305  soft-tissue]
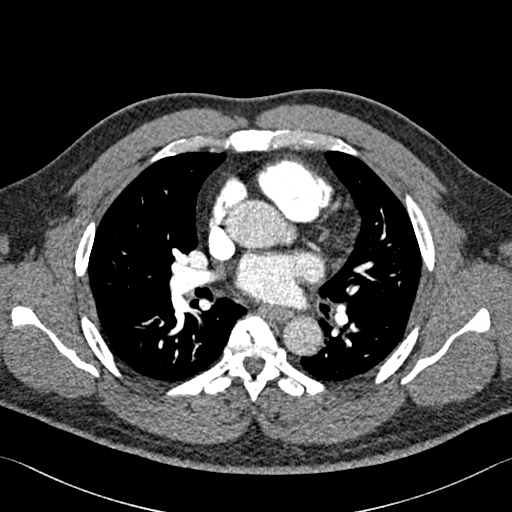
[im 203/305  lung]
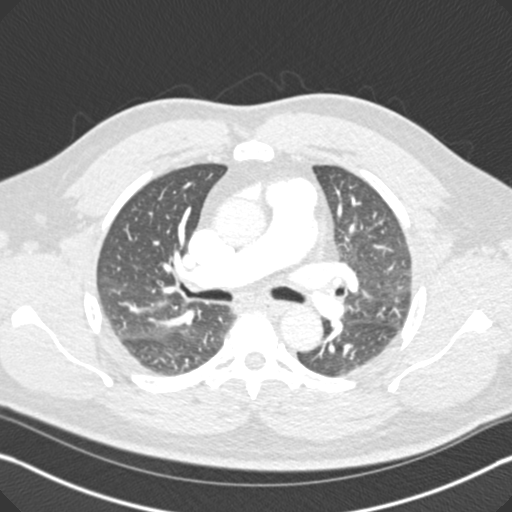
[im 237/305  soft-tissue]
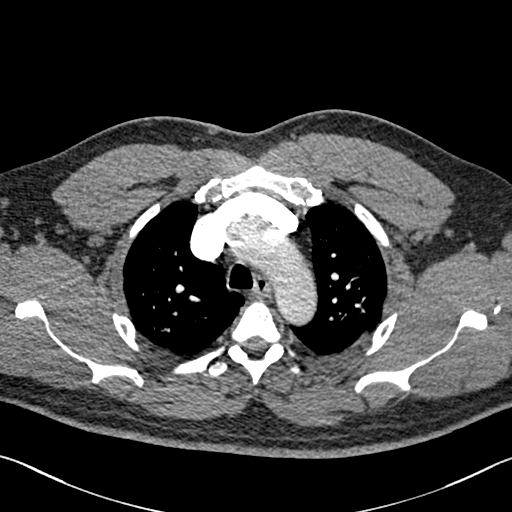
[im 254/305  lung]
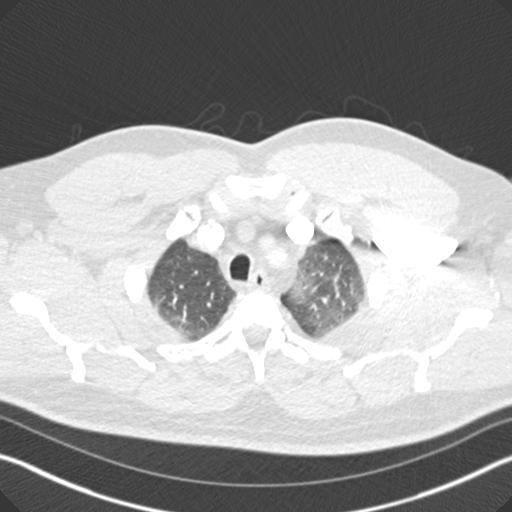
[im 271/305  soft-tissue]
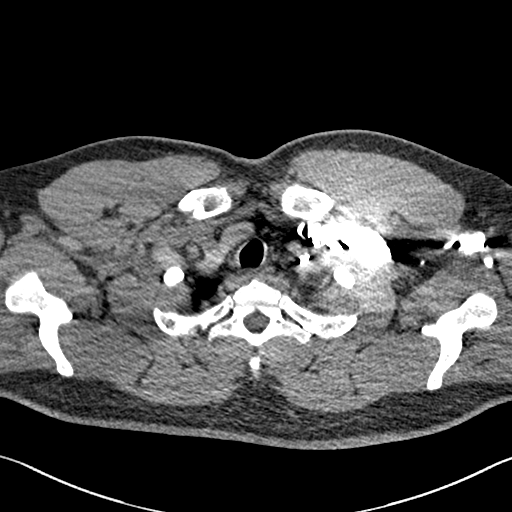
[im 288/305  lung]
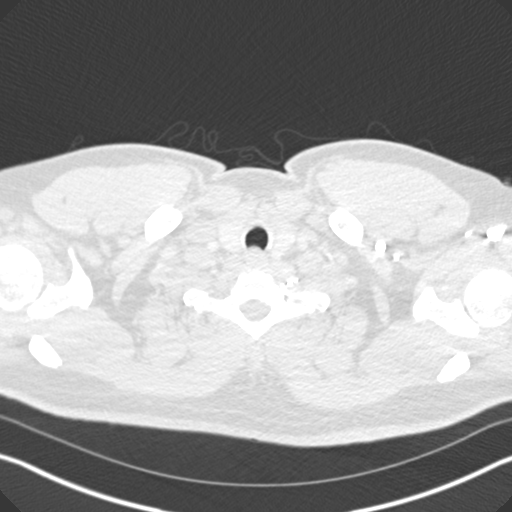

[Series 7: coronal mpr · coronal · 0.59mm/px · 3 of 129 slices shown]
[im 33/129  soft-tissue]
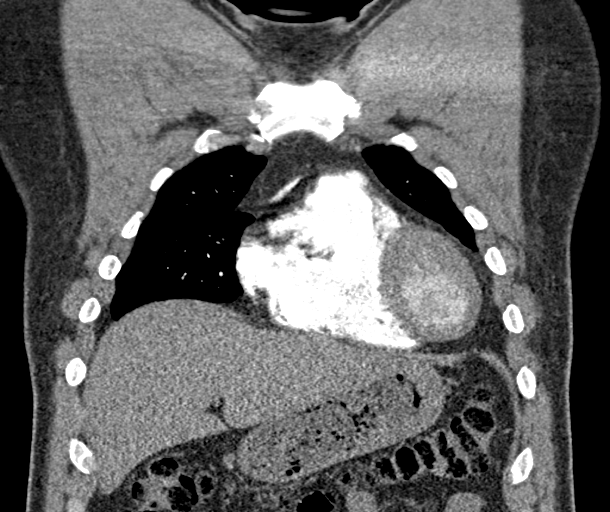
[im 65/129  soft-tissue]
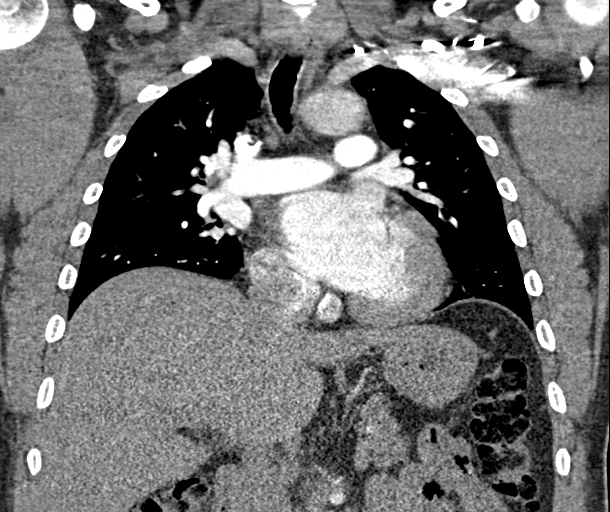
[im 97/129  soft-tissue]
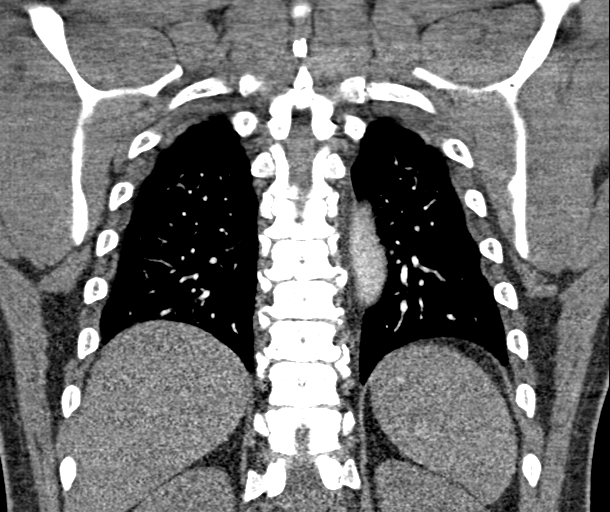

[18 of 46 positions shown; findings below may reference images not displayed]

FINDINGS: Cardiovascular: Heart size is normal. There is no significant
pericardial fluid, thickening or pericardial calcification. No
atherosclerotic calcifications in the thoracic aorta or coronary
arteries.

Mediastinum/Nodes: No pathologically enlarged mediastinal or hilar
lymph nodes. Please note that accurate exclusion of hilar adenopathy
is limited on noncontrast CT scans. Esophagus is unremarkable in
appearance. No axillary lymphadenopathy.

Lungs/Pleura: No suspicious cystic or solid hepatic lesions. No
intra or extrahepatic biliary ductal dilatation. Chronic scarring in
the inferior segment of the lingula. No definite suspicious
appearing pulmonary nodules or masses are noted.

Upper Abdomen: Unremarkable.

Musculoskeletal: There are no aggressive appearing lytic or blastic
lesions noted in the visualized portions of the skeleton.

Review of the MIP images confirms the above findings.
IMPRESSION: 1. No acute findings are noted in the thorax to account for the
patient's symptoms. Specifically, no evidence of pulmonary embolism.

## 2020-02-03 MED ORDER — IOHEXOL 350 MG/ML SOLN
80.0000 mL | Freq: Once | INTRAVENOUS | Status: AC | PRN
  Administered 2020-02-03: 80 mL via INTRAVENOUS

## 2020-02-03 NOTE — Telephone Encounter (Signed)
Spoke with Casey Park this morning.  He has a history of DVT and PE on DOAC, Eliquis.  He had extended travel, 3-hour plane flight on Tuesday and then subsequently Friday to get home.  He did try to get up to walk around.  On Saturday he had some right calf cramping that last about 20 minutes.  He subsequently developed some chest/back discomfort with pleuritic nature later in the day.  This is persisted.  His calf discomfort has resolved.  I think will be reasonable to check a CT-PA now, avoid ER since he is stable from respiratory and hemodynamic standpoint.  If there is evidence for new clot then we may have to deem him in Eliquis failure.  We will order the CT and follow-up with him this week.

## 2020-02-03 NOTE — Telephone Encounter (Signed)
I reviewed the patient's CT-PA this afternoon.  It shows no evidence of PE, resolving lingular and right lower lobe pulmonary infarct with some residual scar present.  I do not see anything to explain his recurrent right back/flank pain.  My most significant concern was possible pulmonary embolism and Eliquis failure.  No evidence to support this.  I was able to communicate the results to the patient via text message.  He will continue the Eliquis as planned. I'll cancel the OV for tomorrow.

## 2020-02-03 NOTE — Telephone Encounter (Signed)
Spoke with the pt and scheduled for appt at 2 pm tomorrow 02/04/20

## 2020-02-04 ENCOUNTER — Ambulatory Visit (INDEPENDENT_AMBULATORY_CARE_PROVIDER_SITE_OTHER)

## 2020-02-04 ENCOUNTER — Other Ambulatory Visit: Payer: Self-pay | Admitting: Podiatry

## 2020-02-04 ENCOUNTER — Encounter: Payer: Self-pay | Admitting: Podiatry

## 2020-02-04 ENCOUNTER — Ambulatory Visit: Admitting: Emergency Medicine

## 2020-02-04 ENCOUNTER — Ambulatory Visit: Admitting: Podiatry

## 2020-02-04 DIAGNOSIS — M79672 Pain in left foot: Secondary | ICD-10-CM | POA: Diagnosis not present

## 2020-02-04 DIAGNOSIS — M79671 Pain in right foot: Secondary | ICD-10-CM

## 2020-02-04 DIAGNOSIS — M722 Plantar fascial fibromatosis: Secondary | ICD-10-CM | POA: Diagnosis not present

## 2020-02-04 MED ORDER — METHYLPREDNISOLONE 4 MG PO TBPK: ORAL_TABLET | ORAL | 0 refills | Status: DC

## 2020-02-04 NOTE — Progress Notes (Signed)
  Subjective:  Patient ID: Casey Park, male    DOB: 1970/08/25,  MRN: [id number]  Chief Complaint  Patient presents with  . Foot Pain    PT stated that he has bilateral heel spurs and he cant workout anymore and it is painful to walk     49 y.o. male presents with the above complaint. History confirmed with patient.  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  He felt like it also got worse during his time in the [employer].  Objective:  Physical Exam: warm, good capillary refill, no trophic changes or ulcerative lesions, normal DP and PT pulses and normal sensory exam. Left Foot: point tenderness over the heel pad  Right Foot: point tenderness over the heel pad   No images are attached to the encounter.  Radiographs: X-ray of both feet: no fracture, dislocation, swelling or degenerative changes noted and plantar calcaneal spur bilaterally Assessment:   1. Foot pain, bilateral   2. Plantar fasciitis, bilateral      Plan:  Patient was evaluated and treated and all questions answered.  Discussed the etiology and treatment options for plantar fasciitis including stretching, formal physical therapy, supportive shoegears such as a running shoe or sneaker, pre fabricated orthoses, injection therapy, and oral medications. We also discussed the role of surgical treatment of this for patients who do not improve after exhausting non-surgical treatment options.   -XR reviewed with patient -Educated patient on stretching and icing of the affected limb -Plantar fascial brace dispensed -Injection delivered to the plantar fascia of both feet. -Rx for meloxicam. Educated on use, risks and benefits of the medication  After sterile prep with povidone-iodine solution and alcohol, the bilateral heel was injected with 1cc 2% xylocaine plain, 10 mg triamcinolone acetonide, and 4mg  dexamethasone was injected along  the plantar  fascia at the insertion on the plantar calcaneus. The patient tolerated the procedure well without complication.   No follow-ups on file.

## 2020-02-04 NOTE — Patient Instructions (Signed)

## 2020-02-05 NOTE — Patient Instructions (Signed)

## 2020-02-11 MED ORDER — APIXABAN 5 MG PO TABS: 5.0000 mg | ORAL_TABLET | Freq: Two times a day (BID) | ORAL | 3 refills | Status: AC

## 2020-03-05 ENCOUNTER — Ambulatory Visit: Admitting: Podiatry

## 2020-03-05 ENCOUNTER — Other Ambulatory Visit: Payer: Self-pay

## 2020-03-05 DIAGNOSIS — M722 Plantar fascial fibromatosis: Secondary | ICD-10-CM | POA: Diagnosis not present

## 2020-03-05 DIAGNOSIS — M79671 Pain in right foot: Secondary | ICD-10-CM | POA: Diagnosis not present

## 2020-03-05 DIAGNOSIS — M216X2 Other acquired deformities of left foot: Secondary | ICD-10-CM | POA: Diagnosis not present

## 2020-03-05 DIAGNOSIS — M62462 Contracture of muscle, left lower leg: Secondary | ICD-10-CM

## 2020-03-05 DIAGNOSIS — M62461 Contracture of muscle, right lower leg: Secondary | ICD-10-CM

## 2020-03-05 DIAGNOSIS — M21862 Other specified acquired deformities of left lower leg: Secondary | ICD-10-CM

## 2020-03-05 DIAGNOSIS — M216X1 Other acquired deformities of right foot: Secondary | ICD-10-CM | POA: Diagnosis not present

## 2020-03-05 DIAGNOSIS — M21861 Other specified acquired deformities of right lower leg: Secondary | ICD-10-CM

## 2020-03-05 DIAGNOSIS — M79672 Pain in left foot: Secondary | ICD-10-CM

## 2020-03-05 NOTE — Patient Instructions (Signed)

## 2020-03-08 ENCOUNTER — Encounter: Payer: Self-pay | Admitting: Podiatry

## 2020-03-08 NOTE — Progress Notes (Signed)
  Subjective:  Patient ID: Casey Park, male    DOB: 20-Jul-1970,  MRN: [id number]  Chief Complaint  Patient presents with  . Foot Pain    BIlateral foot pain. PT stated that it is not as bad as before but he is still having some pain     49 y.o. male returns with the above complaint. History confirmed with patient.  Injection helped slightly but still painful. Would like to know what else can be done.  Objective:  Physical Exam: warm, good capillary refill, no trophic changes or ulcerative lesions, normal DP and PT pulses and normal sensory exam. Bilaterally he has gastrocnemius equinus  Left Foot: point tenderness over the heel pad  Right Foot: point tenderness over the heel pad   No images are attached to the encounter.  Radiographs: X-ray of both feet: no fracture, dislocation, swelling or degenerative changes noted and plantar calcaneal spur bilaterally Assessment:   1. Foot pain, bilateral   2. Plantar fasciitis, bilateral   3. Gastrocnemius equinus of right lower extremity   4. Gastrocnemius equinus of left lower extremity      Plan:  Patient was evaluated and treated and all questions answered.  Discussed the etiology and treatment options for plantar fasciitis including stretching, formal physical therapy, supportive shoegears such as a running shoe or sneaker, pre fabricated orthoses, injection therapy, and oral medications. We also discussed the role of surgical treatment of this for patients who do not improve after exhausting non-surgical treatment options.  - at this point he has had minimal improvement and has been dealing with this for quite some time, he would like to consider surgical treatment which we discussed again today - I would like to order an MRI for both feet to evaluate the integrity of the PF and any osseous edema in the calcaneus - He will follow-up in 1 month to review.  - Tentatively considering bilateral EPF and gastroc recession bilaterally,  staged with each side   No follow-ups on file.

## 2020-03-19 ENCOUNTER — Ambulatory Visit
Admission: RE | Admit: 2020-03-19 | Discharge: 2020-03-19 | Disposition: A | Discharge status: Home / Self Care | Source: Ambulatory Visit | Attending: Podiatry | Admitting: Podiatry

## 2020-03-19 DIAGNOSIS — M79672 Pain in left foot: Secondary | ICD-10-CM

## 2020-03-19 DIAGNOSIS — M79671 Pain in right foot: Secondary | ICD-10-CM

## 2020-03-19 DIAGNOSIS — M722 Plantar fascial fibromatosis: Secondary | ICD-10-CM

## 2020-03-19 IMAGING — MR MR HEEL *L* W/O CM
4 of 5 series · 13 of 40 positions shown · non-contrast
Comparison: None.

CLINICAL DATA: Chronic left ankle pain.  Plantar fasciitis.

EXAM:
MR OF THE LEFT HEEL WITHOUT CONTRAST
TECHNIQUE: Multiplanar, multisequence MR imaging of the left heel was
performed. No intravenous contrast was administered.

[Series 3: PD fat-sat · axial · left · 3.0mm · 0.25mm/px · z∈[-77,+30]mm · 4 of 32 slices shown]
[im 1/32]
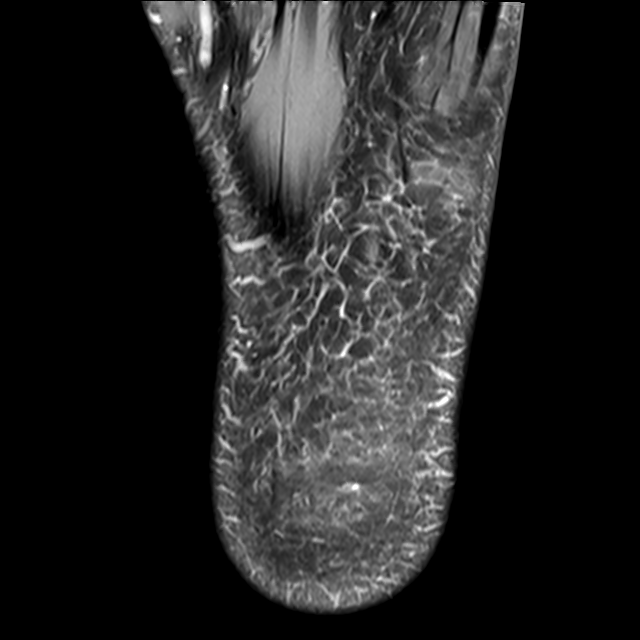
[im 4/32]
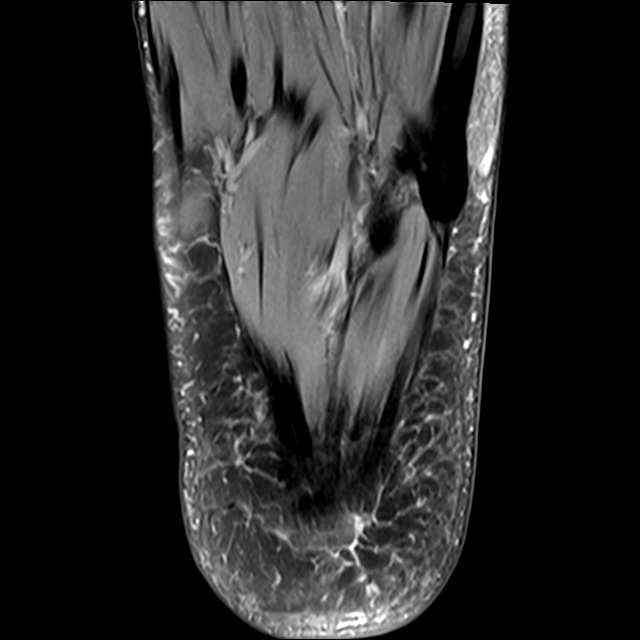
[im 16/32]
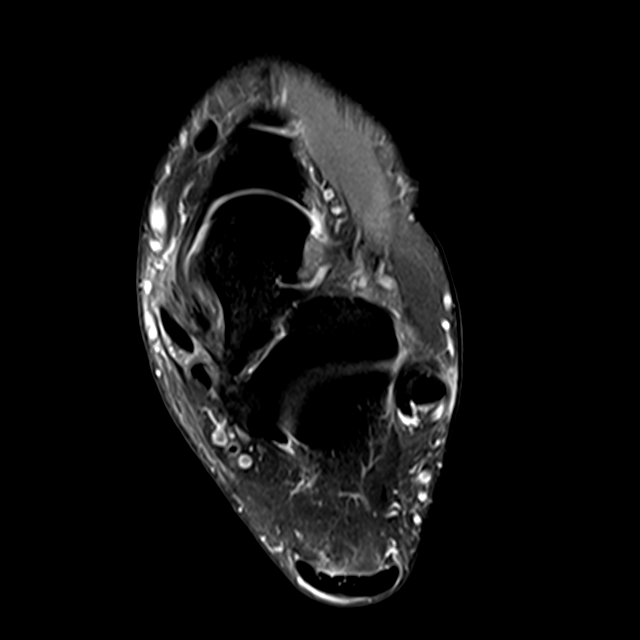
[im 28/32]
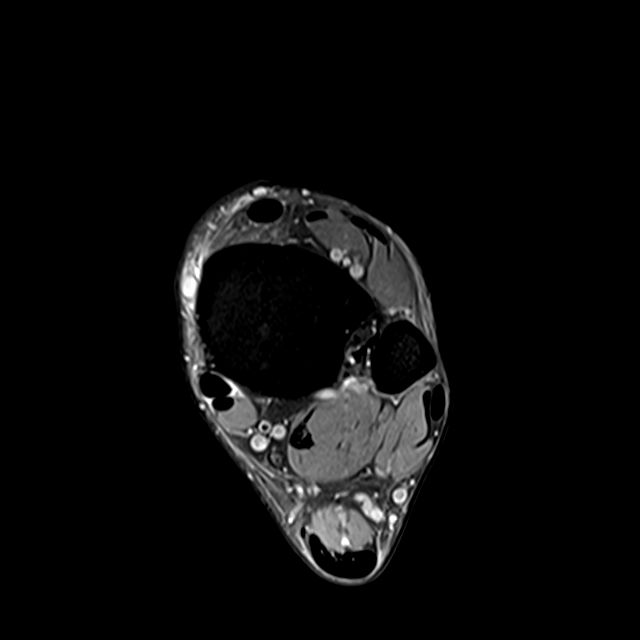

[Series 4: T2 fat-sat · axial · left · 3.0mm · 0.25mm/px · z∈[-65,+30]mm · 3 of 32 slices shown (1 of 2)]
[im 4/32]
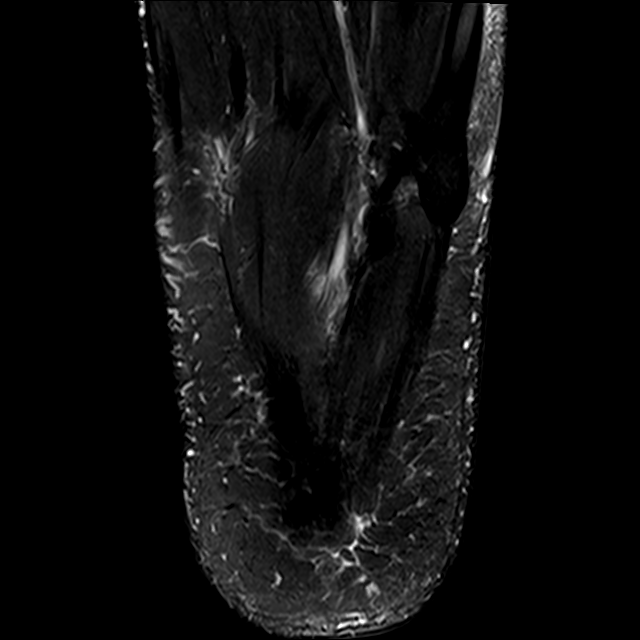
[im 16/32]
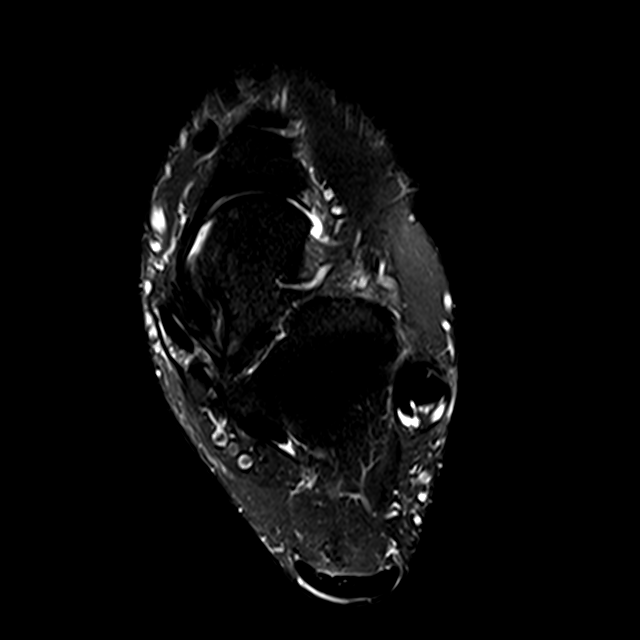
[im 28/32]
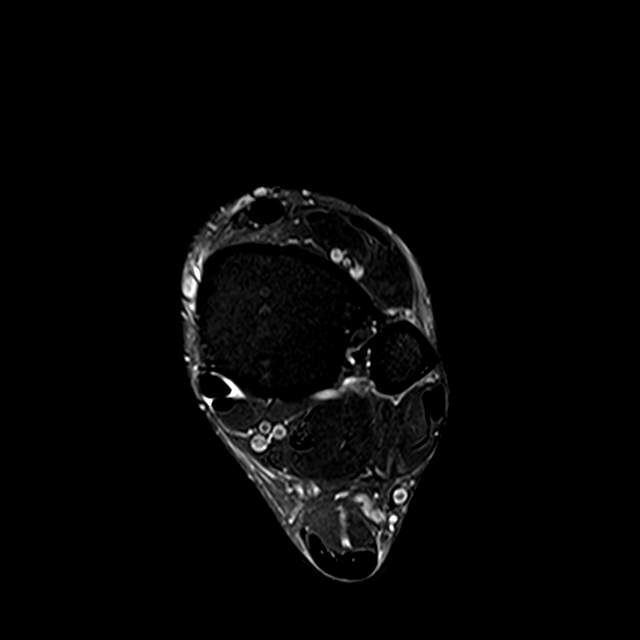

[Series 5: T1 · sagittal · left · 4.0mm · 0.27mm/px · 3 of 24 slices shown]
[im 5/24]
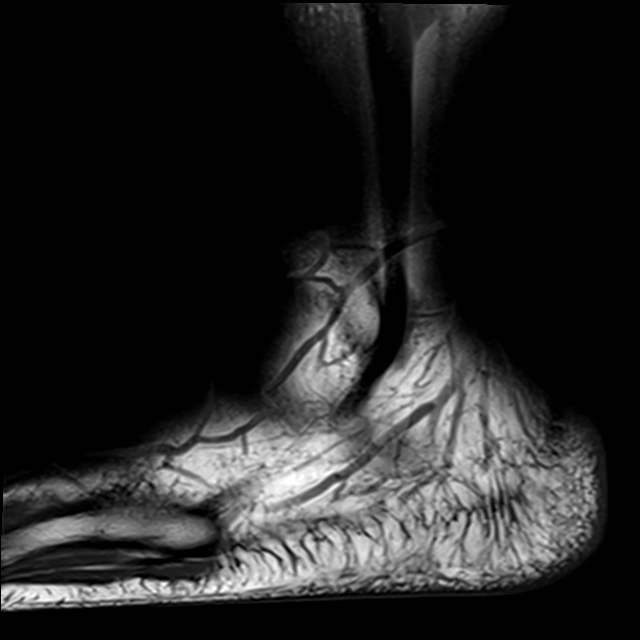
[im 14/24]
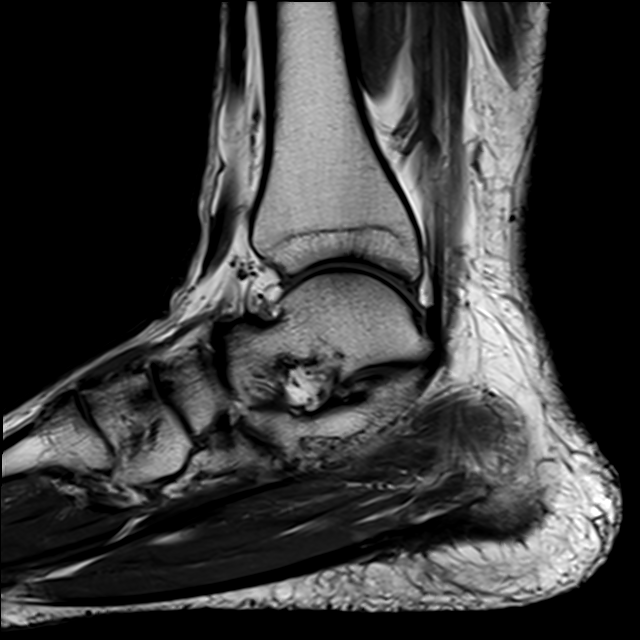
[im 24/24]
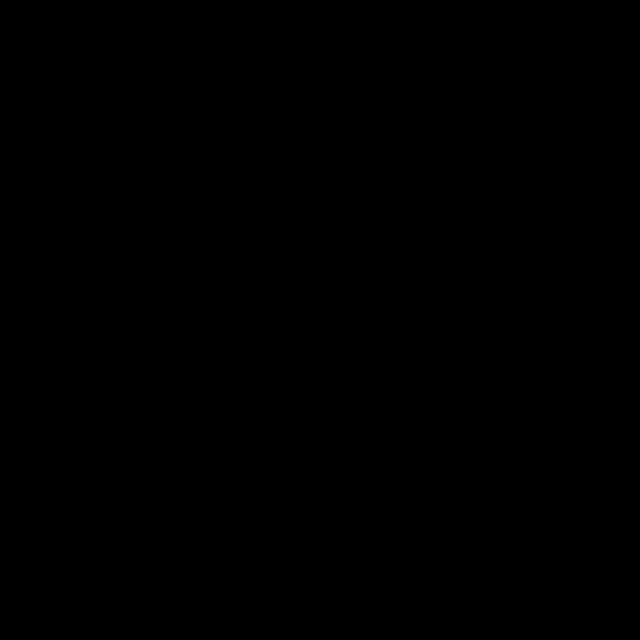

[Series 7: T2 fat-sat · coronal · left · 3.0mm · 0.25mm/px · 3 of 38 slices shown (2 of 2)]
[im 5/38]
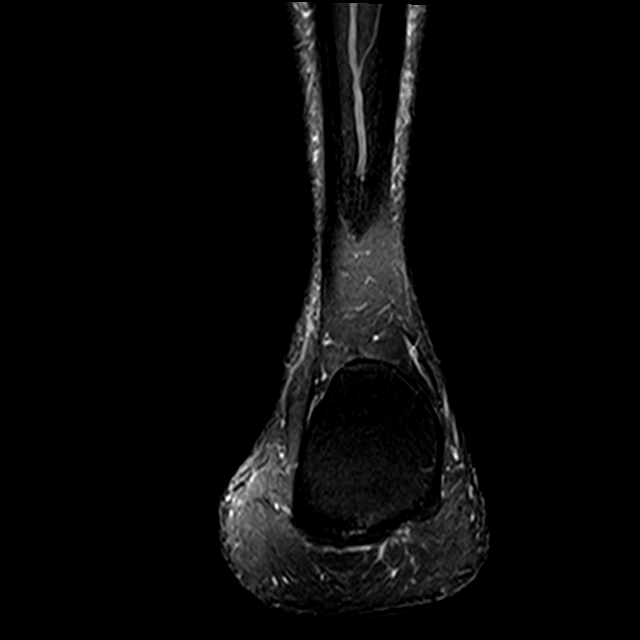
[im 21/38]
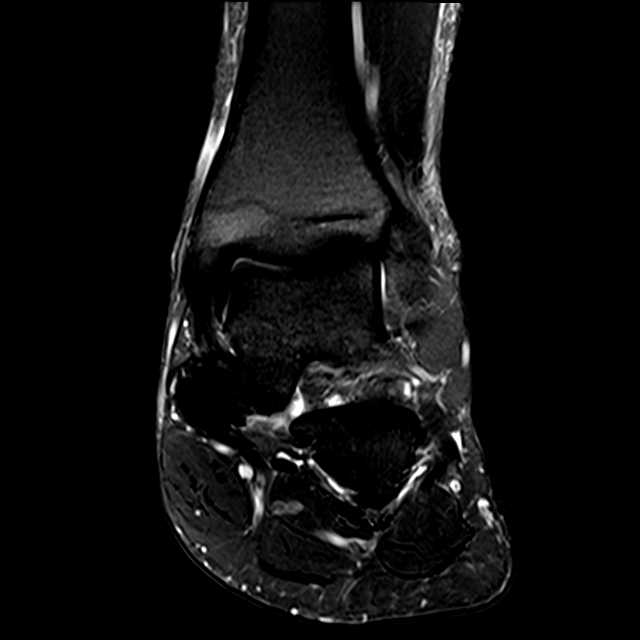
[im 33/38]
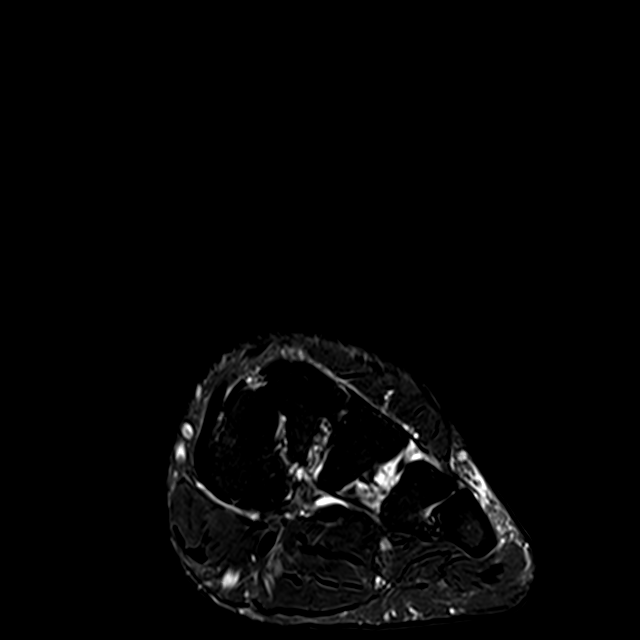

[13 of 40 positions shown; findings below may reference images not displayed]

FINDINGS: TENDONS

Peroneal: Tiny interstitial tear within the retro-malleolar aspect
of the peroneus longus tendon (series 4, image 12). Tendinosis of
the infra-malleolar peroneus brevis tendon without definite split
tear. Trace tenosynovial fluid.

Posteromedial: Intact tibialis posterior, flexor hallucis longus and
flexor digitorum longus tendons.

Anterior: Intact tibialis anterior, extensor hallucis longus and
extensor digitorum longus tendons.

Achilles: Intact.

Plantar Fascia: Fusiform thickening of the central band of the
plantar fascia without tear. Trace perifascial edema.

LIGAMENTS

Lateral: The anterior and posterior tibiofibular ligaments are
intact. The anterior and posterior talofibular ligaments are intact.
Intact calcaneofibular ligament.

Medial: Deltoid ligament and spring ligament complex intact.

CARTILAGE

Ankle Joint: Mild focal subchondral marrow edema at the midportion
of the medial talar shoulder without definite overlying cartilage
defect. Tibiotalar joint is otherwise within normal limits. No joint
effusion.

Subtalar Joints/Sinus Tarsi: No joint effusion or chondral defect.
Preservation of the anatomic fat within the sinus tarsi.

Bones: Subtle marrow edema at the plantar fascial enthesis on the
calcaneal tuberosity. No acute fracture. No malalignment. No
suspicious bone lesion.

Other: No soft tissue edema or fluid collection.
IMPRESSION: Left heel:

1. Findings suggestive of mild plantar fasciitis.
2. Tiny interstitial tear within the retro-malleolar aspect of the
peroneus longus tendon. Tendinosis of the infra-malleolar peroneus
brevis tendon without definite split tear.
3. Mild focal subchondral marrow edema at the midportion of the
medial talar shoulder without definite overlying cartilage defect.

## 2020-03-19 IMAGING — MR MR HEEL *R* W/O CM
4 of 5 series · 13 of 40 positions shown · non-contrast
Comparison: None.

CLINICAL DATA: Chronic right ankle pain.  Plantar fasciitis

EXAM:
MR OF THE RIGHT HEEL WITHOUT CONTRAST
TECHNIQUE: Multiplanar, multisequence MR imaging of the right heel was
performed. No intravenous contrast was administered.

[Series 3: PD fat-sat · axial · right · 3.0mm · 0.25mm/px · z∈[-92,+19]mm · 4 of 34 slices shown]
[im 1/34]
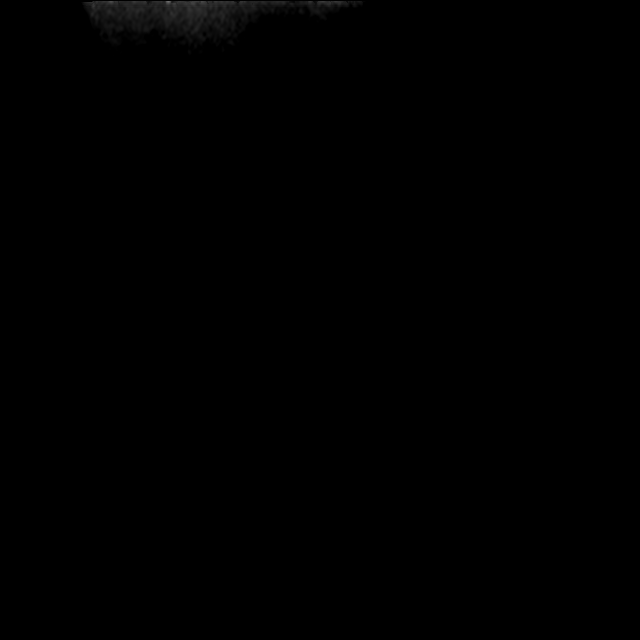
[im 5/34]
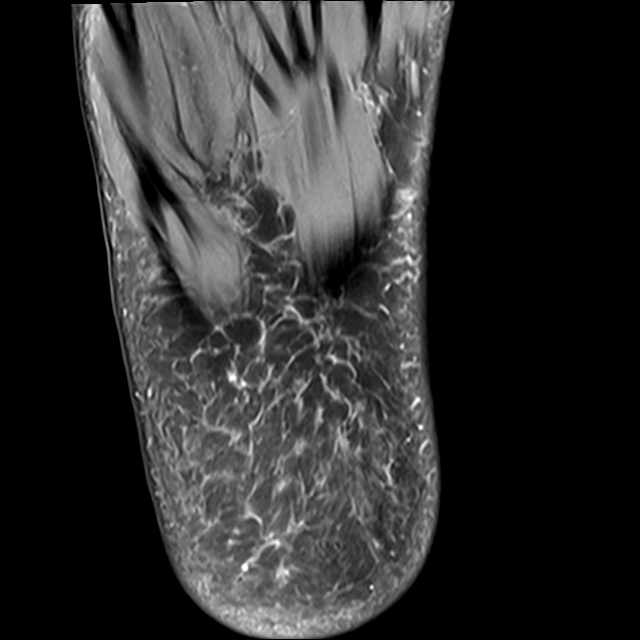
[im 17/34]
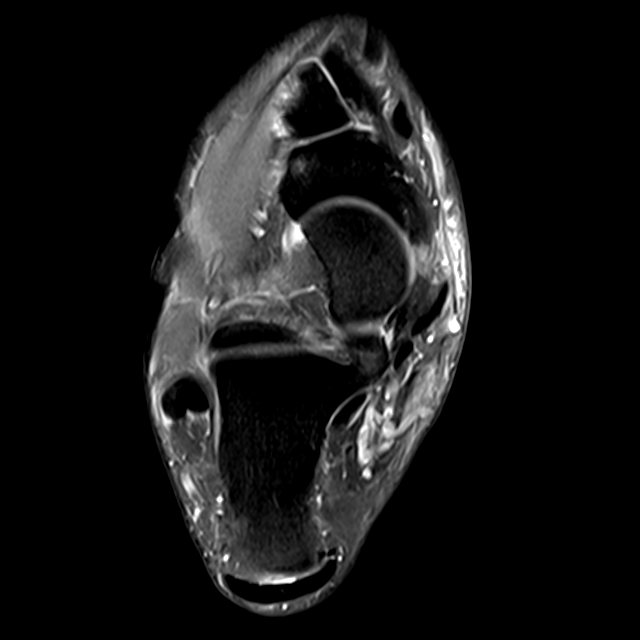
[im 29/34]
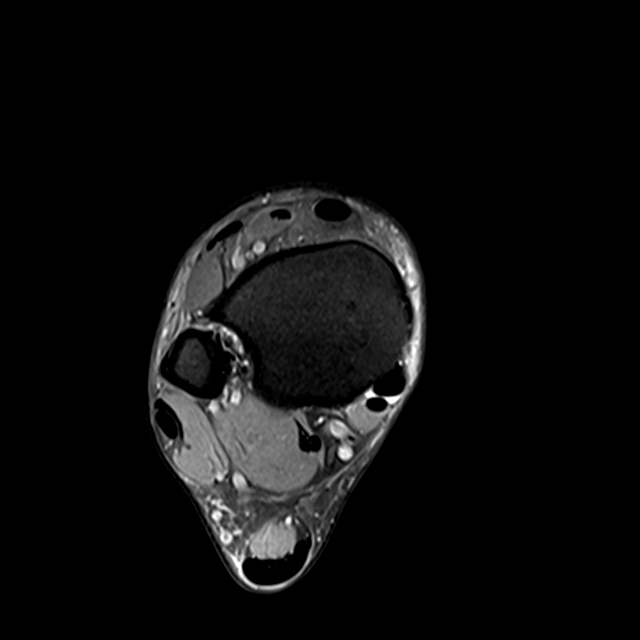

[Series 4: T2 fat-sat · axial · right · 3.0mm · 0.25mm/px · z∈[-76,+19]mm · 3 of 34 slices shown (1 of 2)]
[im 5/34]
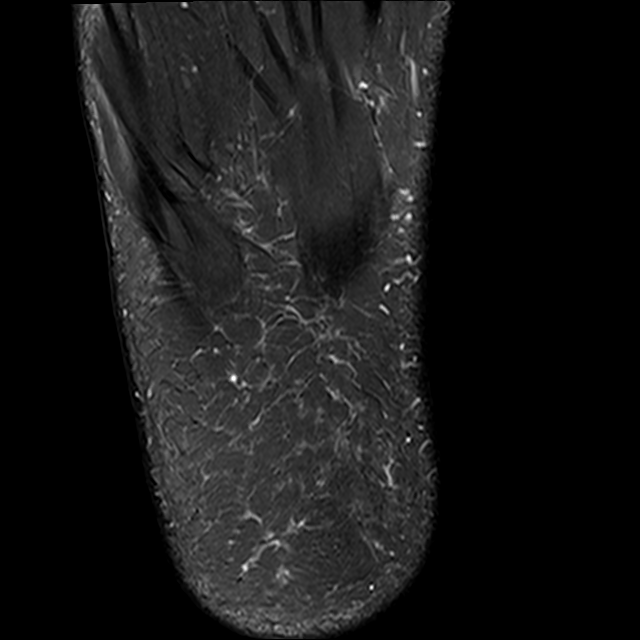
[im 17/34]
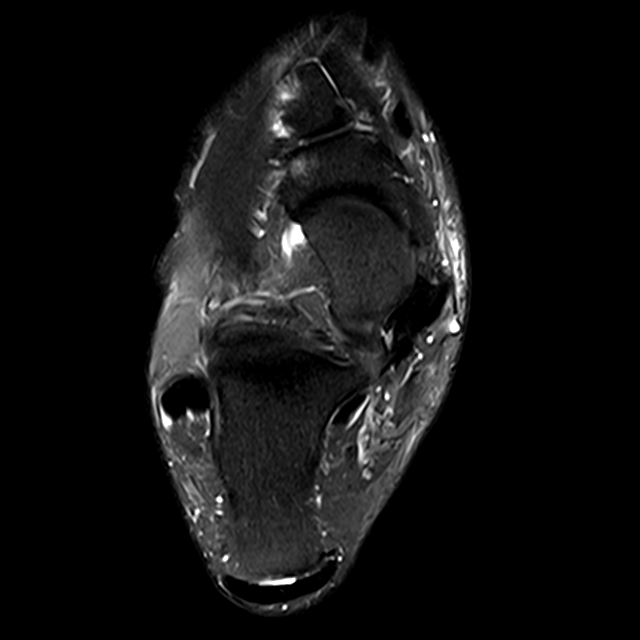
[im 29/34]
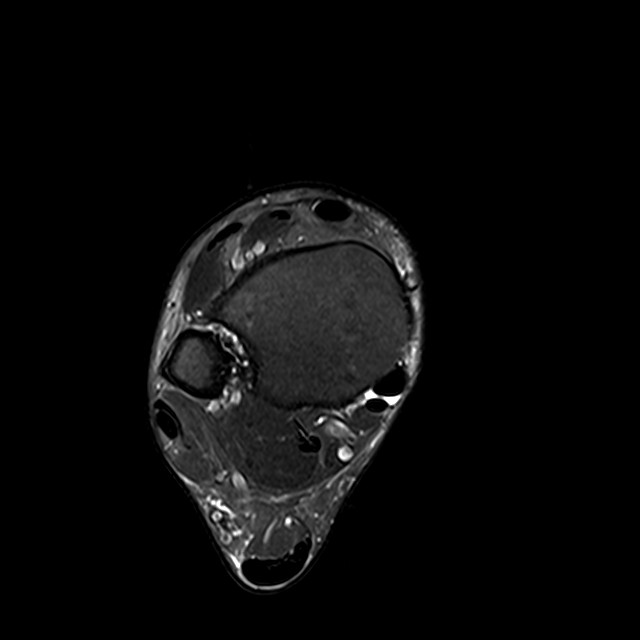

[Series 5: T1 · sagittal · right · 4.0mm · 0.27mm/px · 3 of 24 slices shown]
[im 5/24]
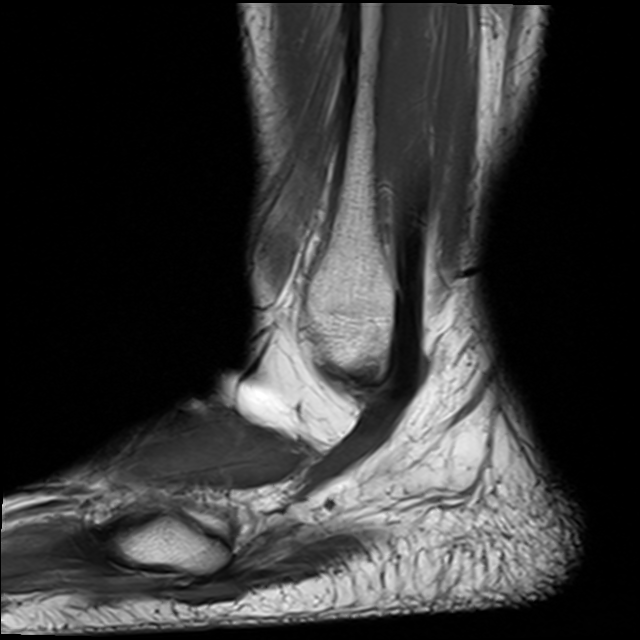
[im 14/24]
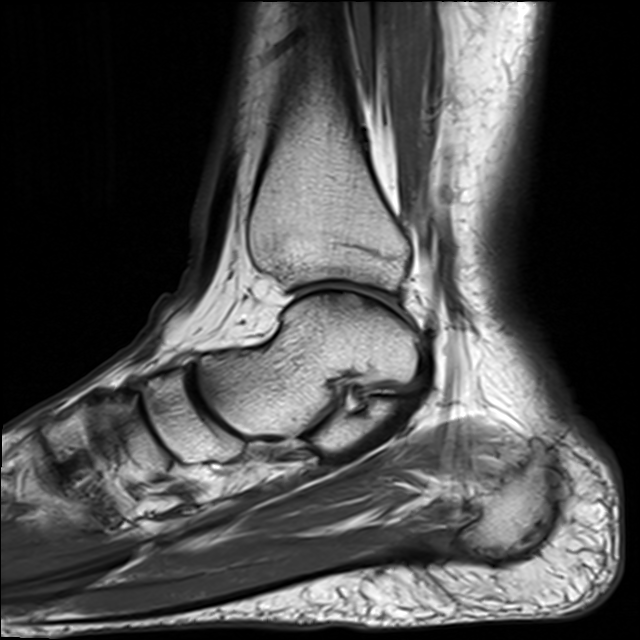
[im 24/24]
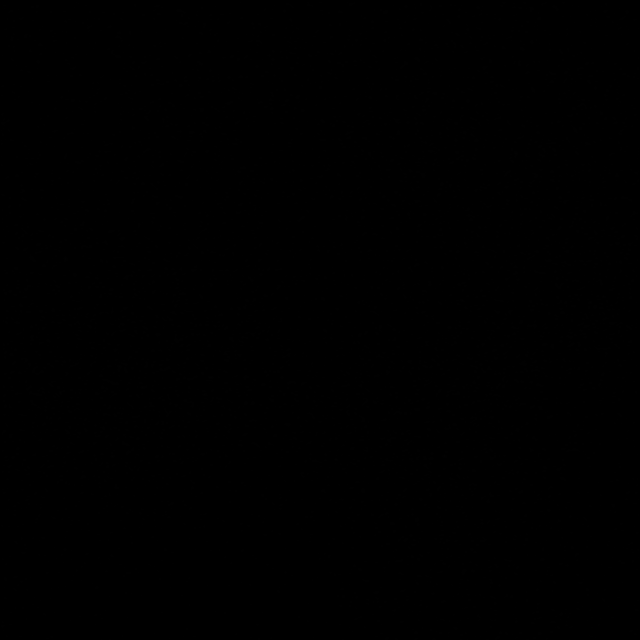

[Series 7: T2 fat-sat · coronal · right · 3.0mm · 0.25mm/px · 3 of 37 slices shown (2 of 2)]
[im 5/37]
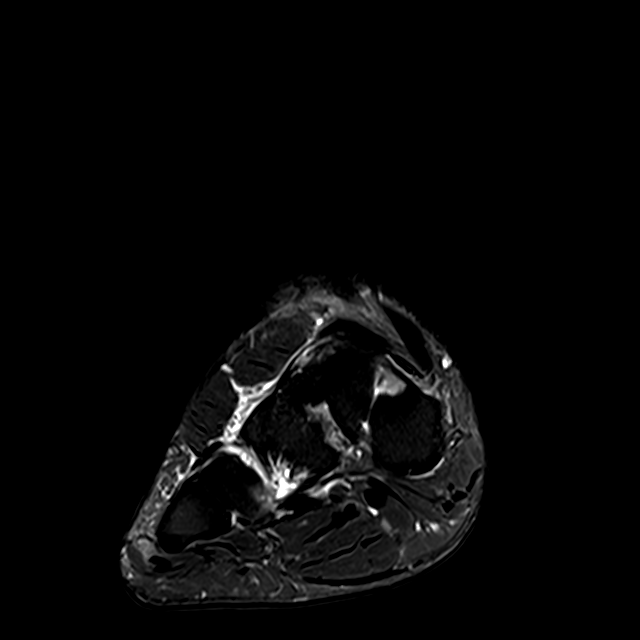
[im 21/37]
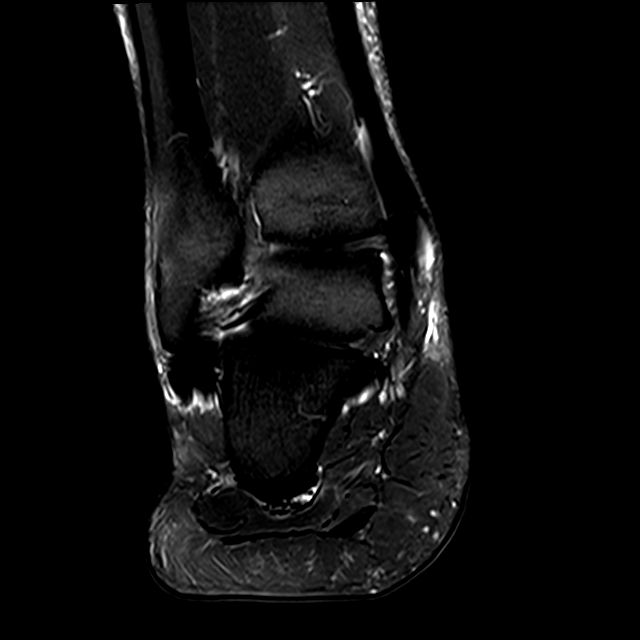
[im 33/37]
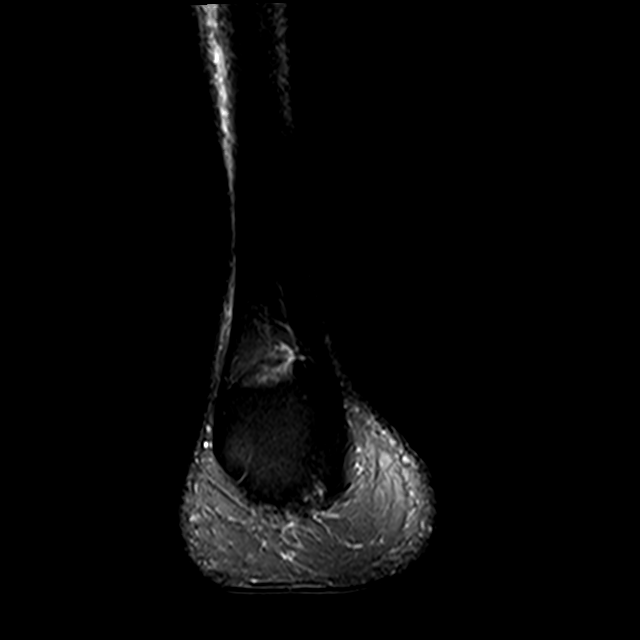

[13 of 40 positions shown; findings below may reference images not displayed]

FINDINGS: TENDONS

Peroneal: Tendinosis with longitudinal split tear involving the
retro malleolar and infra malleolar portions of the peroneus brevis
tendon. Peroneus longus tendon intact with mild tendinosis.

Posteromedial: Intact tibialis posterior, flexor hallucis longus and
flexor digitorum longus tendons.

Anterior: Intact tibialis anterior, extensor hallucis longus and
extensor digitorum longus tendons.

Achilles: Intact.

Plantar Fascia: Fusiform thickening of the central band of the
plantar fascia without tear. Trace perifascial edema.

LIGAMENTS

Lateral: The anterior and posterior tibiofibular ligaments are
intact. The anterior and posterior talofibular ligaments are intact.
Intact calcaneofibular ligament.

Medial: Deltoid ligament intact. Thickened appearance of the
superomedial calcaneonavicular ligament. Spring ligament complex
otherwise intact.

CARTILAGE

Ankle Joint: Focal full-thickness cartilage defect at the medial
talar shoulder measuring at least 8 mm in transverse dimension with
underlying subchondral cystic changes. No joint effusion.

Subtalar Joints/Sinus Tarsi: No joint effusion or chondral defect.
Preservation of the anatomic fat within the sinus tarsi.

Bones: No acute fracture. No dislocation. No suspicious bone lesion.

Other: Mild edema at the medial aspect of the ankle/hindfoot. No
fluid collections.
IMPRESSION: Right heel:

1. Findings suggestive of mild plantar fasciitis.
2. Tendinosis with longitudinal split tear of the peroneus brevis
tendon.
3. Focal full-thickness cartilage defect at the medial talar
shoulder measuring at least 8 mm in transverse dimension with
underlying subchondral cystic changes.
4. Thickened appearance of the superomedial calcaneonavicular
ligament, which may be degenerative or related to remote injury.

## 2020-03-22 ENCOUNTER — Other Ambulatory Visit

## 2020-03-25 ENCOUNTER — Telehealth: Payer: Self-pay | Admitting: Hematology

## 2020-03-25 NOTE — Telephone Encounter (Signed)
Rescheduled 12/29 appointment to 01/24 per provider pal, patient has been called and notified.

## 2020-04-01 ENCOUNTER — Other Ambulatory Visit

## 2020-04-01 ENCOUNTER — Ambulatory Visit: Admitting: Hematology

## 2020-04-07 ENCOUNTER — Ambulatory Visit (INDEPENDENT_AMBULATORY_CARE_PROVIDER_SITE_OTHER): Admitting: Podiatry

## 2020-04-07 ENCOUNTER — Other Ambulatory Visit: Payer: Self-pay

## 2020-04-07 DIAGNOSIS — M21862 Other specified acquired deformities of left lower leg: Secondary | ICD-10-CM

## 2020-04-07 DIAGNOSIS — M21861 Other specified acquired deformities of right lower leg: Secondary | ICD-10-CM

## 2020-04-07 DIAGNOSIS — M722 Plantar fascial fibromatosis: Secondary | ICD-10-CM | POA: Diagnosis not present

## 2020-04-07 DIAGNOSIS — M216X2 Other acquired deformities of left foot: Secondary | ICD-10-CM

## 2020-04-07 DIAGNOSIS — M216X1 Other acquired deformities of right foot: Secondary | ICD-10-CM

## 2020-04-08 ENCOUNTER — Encounter: Payer: Self-pay | Admitting: Podiatry

## 2020-04-08 NOTE — Progress Notes (Signed)
  Subjective:  Patient ID: Casey Park, male    DOB: 12-27-1970,  MRN: [id number]  Chief Complaint  Patient presents with  . Plantar Fasciitis    50 y.o. male returns with the above complaint. History confirmed with patient.  He completed the MRI and is here for review.  His pain feels about the same  Objective:  Physical Exam: warm, good capillary refill, no trophic changes or ulcerative lesions, normal DP and PT pulses and normal sensory exam. Bilaterally he has gastrocnemius equinus  Left Foot: point tenderness over the heel pad  Right Foot: point tenderness over the heel pad   No images are attached to the encounter.  Radiographs: X-ray of both feet: no fracture, dislocation, swelling or degenerative changes noted and plantar calcaneal spur bilaterally  IMPRESSION: Right heel:  1. Findings suggestive of mild plantar fasciitis. 2. Tendinosis with longitudinal split tear of the peroneus brevis tendon. 3. Focal full-thickness cartilage defect at the medial talar shoulder measuring at least 8 mm in transverse dimension with underlying subchondral cystic changes. 4. Thickened appearance of the superomedial calcaneonavicular ligament, which may be degenerative or related to remote injury.  IMPRESSION: Left heel:  1. Findings suggestive of mild plantar fasciitis. 2. Tiny interstitial tear within the retro-malleolar aspect of the peroneus longus tendon. Tendinosis of the infra-malleolar peroneus brevis tendon without definite split tear. 3. Mild focal subchondral marrow edema at the midportion of the medial talar shoulder without definite overlying cartilage defect.  Assessment:   No diagnosis found.   Plan:  Patient was evaluated and treated and all questions answered.  Reviewed findings from the the MRIs together and discuss further treatment options.  So far he has not had much relief from conservative therapy.  We discussed further treatment with either a  noninvasive route with ESWT/EPAT versus surgical intervention.  Surgically we discussed plantar fascia release and gastrocnemius recession.  He is going on vacation in April of this year and would like to try the ESWT prior to this date and if is not successful plan for surgery when he returns.  He will be scheduled for sessions ASAP.   Return in about 9 weeks (around 06/09/2020) for recheck plantar fasciitis.

## 2020-04-13 ENCOUNTER — Other Ambulatory Visit: Payer: Self-pay

## 2020-04-13 ENCOUNTER — Ambulatory Visit (INDEPENDENT_AMBULATORY_CARE_PROVIDER_SITE_OTHER): Admitting: *Deleted

## 2020-04-13 DIAGNOSIS — M21862 Other specified acquired deformities of left lower leg: Secondary | ICD-10-CM

## 2020-04-13 DIAGNOSIS — B351 Tinea unguium: Secondary | ICD-10-CM

## 2020-04-13 DIAGNOSIS — M216X2 Other acquired deformities of left foot: Secondary | ICD-10-CM

## 2020-04-13 DIAGNOSIS — M722 Plantar fascial fibromatosis: Secondary | ICD-10-CM

## 2020-04-13 NOTE — Progress Notes (Signed)
Patient presents for the 1st EPAT treatment today with complaint of plantar heel pain bilateral. Diagnosed with plantar fasciitis by Dr. Sherryle Lis. This has been ongoing for several months. The patient has tried ice, stretching, NSAIDS and supportive shoe gear with no long term relief.   Most of the pain is located plantar heel BILATERAL. Right is more lateral and left is more central.  ESWT administered and tolerated well.Treatment settings initiated at:   Energy: 15  Ended treatment session today with 3000 shocks at the following settings:   Energy: 15  Frequency: 6.0  Joules: 14.72   Reviewed post EPAT instructions. Advised to avoid ice and NSAIDs throughout the treatment process and to utilize boot or supportive shoes for at least the next 3 days.  Follow up for 2nd treatment in 1 week.

## 2020-04-13 NOTE — Patient Instructions (Signed)

## 2020-04-26 NOTE — Progress Notes (Signed)
HEMATOLOGY/ONCOLOGY CONSULTATION NOTE  Date of Service: 04/26/2020  Patient Care Team: Patient, No Pcp Per as PCP - General (General Practice)  CHIEF COMPLAINTS/PURPOSE OF CONSULTATION:  PE/DVT  HISTORY OF PRESENTING ILLNESS:   Casey Park is a wonderful 50 y.o. male who has been referred to [place] for evaluation and management of pulmonary embolism/deep vein thrombosis. The pt reports that he is doing well overall.   The pt reports that he was diagnosed with HTN and placed on Lisinopril 7-8 years ago, he does not remember ever being diagnosed with HLD, but was placed on Crestor preventatively. He was also taking a daily baby Aspirin at the time of his PE/DVT. Pt has a history of acid reflux and has been using Pantoprazole as needed. He has no prior history of blood clots.   Pt had a Colonoscopy nearly 6 years ago, where several adenomatous polyps were found. He had a follow up Colonoscopy the next year and was told to follow up in five years. Pt is scheduled for a repeat Colonoscopy next year. Pt denies any GI symptoms.   About a month ago pt did a two mile run and began to notice swelling in his left leg. A few days later he began experiencing a pain in his left scapula. This occurred on a Friday and he spent the following weekend at [facility] with some friends. During the weekend he played golf and participated in other activities with no issue. On Sunday night he began to feel painful chest pressure when laying down. This improved when he sat up. Pt denies sweating, shortness of breath, or palpitations at the time. Pt took Pantoprazole as he was concerned that it was reflux. His symptoms resolved the next day, but he came to the [facility] ED after returning home as a precaution. While in the hospital they completed CT scans and US Venous studies revealing his PE and right-sided DVT. After he was given anticoagulants he began to feel short of breath. He was discharged on Eliquis. Pt  continued having sharp, stabbing pain in his back when laying down at night after discharge. Pt began smoking marijuana every couple of days, which improved his discomfort. He is not currently experiencing any chest pain, back pain, or shortness of breath. He is able to walk 5 miles at least 6 days per week. Pt coughed up dark red blood nearly a week ago. Pt is scheduled to see Dr. Lamonte Sakai on 01/13/20.  He has had no fevers, chills, night sweats, unusual fatigue, or unexpected weight loss and denies taking any steroids or being on any hormonal therapies prior to his clot. Pt was experiencing a significant amount of stress prior to this event. Pt completed his vaccination against COVID19 in April. He denies any recent cough, cold, or any other respiratory symptoms.  Of note prior to the patient's visit today, pt has had CT Angio Chest ([id number]) completed on 12/09/2019 with results revealing "1. Poor quality exam for evaluation of pulmonary embolism. Suspicion of a subsegmental pulmonary embolism to the anterior left upper lobe. Of questionable clinical significance. Otherwise, no evidence of pulmonary embolism to the large lobar level. 2. Subtle peripheral ground-glass opacities which are suspicious for mild COVID-19 pneumonia. 3. Dense consolidation in the left lung base which could represent a focus of infection or infarct if the patient has pulmonary emboli. As this is plain film visible, recommend radiographic follow-up to confirm resolution. 4. Trace bilateral pleural fluid."   Of note since  the patient's last visit, pt has had Lower Venous DVT Study ([id number]) completed on 12/10/2019 with results revealing RIGHT: - Findings consistent with acute deep vein thrombosis involving the right gastrocnemius veins. - No cystic structure found in the popliteal fossa. LEFT: - There is no evidence of deep vein thrombosis in the lower extremity. - No cystic structure found in the popliteal fossa."  Of note  since the patient's last visit, pt has had ECHO completed on 12/10/2019 with results revealing Normal ejection fraction.  Of note since the patient's last visit, pt has had CT Angio Chest ([id number]) completed on 12/18/2019 with results revealing "No evidence of pulmonary embolism. Previously identified pulmonary embolus in LEFT upper lobe no longer seen. Tiny RIGHT pleural effusion. Atelectasis versus consolidation in lingula and posterior base of RIGHT lower lobe."  Most recent lab results (12/10/2019) of CBC is as follows: all values are WNL except for Glucose at 101, Calcium at 8.4. 12/10/2019 CRP at 4.8 12/10/2019 LDH at 227 12/10/2019 Sed Rate at 26 12/10/2019 Fibrinogen at 504 12/10/2019 D-Dimer at 2.79  On review of systems, pt reports hemoptysis and denies unexpected weight loss, abdominal pain, SOB, chest pain, back pain, diarrhea, constipation and any other symptoms.   On PMHx the pt reports GERD, PTSD, HTN, PE, DVT. On Social Hx the pt reports that at his heaviest he smoked about 3/4 ppd but quit on 12/09/19. On Family Hx the pt reports that he has no family history of bleeding or blood clotting disorders.   Interval History   Casey Park is a wonderful 50 y.o.  male who is here today for evaluation and management of pulmonary embolism/deep vein thrombosis. The patient's last visit with [place] was on 01/16/2020. The pt reports that he is doing well overall.  The pt reports no new concerns or symptoms. He has since stopped smoking since his last visit, but notes that he occasionally smokes Marijuana once weekly. This occurs once to 3x weekly. The pt notes that he does not always smoke every week.  The pt notes that he has sciatic nerve pain, but is up-to-date with this matter. He is currently on [employer] therapy prior to the decision on surgery.  Lab results today 04/27/2020 of CBC w/diff and CMP is as follows: all values are WNL except for CMP in progress. 04/27/2020 D-dimer is  in progress.  On review of systems, pt denies SOB, chest pain, leg swelling, calf pain, leg pain, back pain, abdominal pain and any other symptoms.  MEDICAL HISTORY:  Past Medical History:  Diagnosis Date  . GERD (gastroesophageal reflux disease)   . Hyperlipidemia   . Hypertension   . PTSD (post-traumatic stress disorder)     SURGICAL HISTORY: Past Surgical History:  Procedure Laterality Date  . ANTERIOR CRUCIATE LIGAMENT REPAIR    . FOOT SURGERY    . NOSE SURGERY      SOCIAL HISTORY: Social History   Socioeconomic History  . Marital status: Married    Spouse name: Not on file  . Number of children: Not on file  . Years of education: Not on file  . Highest education level: Not on file  Occupational History  . Not on file  Tobacco Use  . Smoking status: Former Smoker    Packs/day: 1.00    Years: 25.00    Pack years: 25.00    Types: Cigarettes, Cigars    Quit date: 12/09/2019    Years since quitting: 0.3  . Smokeless tobacco: Never Used  Vaping Use  . Vaping Use: Never used  Substance and Sexual Activity  . Alcohol use: Yes  . Drug use: Never  . Sexual activity: Not on file  Other Topics Concern  . Not on file  Social History Narrative  . Not on file   Social Determinants of Health   Financial Resource Strain: Not on file  Food Insecurity: Not on file  Transportation Needs: Not on file  Physical Activity: Not on file  Stress: Not on file  Social Connections: Not on file  Intimate Partner Violence: Not on file    FAMILY HISTORY: Family History  Problem Relation Age of Onset  . Stroke Father   . Throat cancer Father     ALLERGIES:  has No Known Allergies.  MEDICATIONS:  Current Outpatient Medications  Medication Sig Dispense Refill  . albuterol (VENTOLIN HFA) 108 (90 Base) MCG/ACT inhaler Inhale 2 puffs into the lungs as needed for wheezing or shortness of breath. (Patient not taking: Reported on 12/17/2019)    . apixaban (ELIQUIS) 5 MG TABS  tablet Take 1 tablet (5 mg total) by mouth 2 (two) times daily. 180 tablet 3  . ASPIRIN LOW DOSE 81 MG EC tablet Take 81 mg by mouth daily.    [name] buPROPion (WELLBUTRIN XL) 150 MG 24 hr tablet Take 150 mg by mouth every morning.    . gabapentin (NEURONTIN) 100 MG capsule Take 100 mg by mouth 3 (three) times daily.    [name] lisinopril (ZESTRIL) 10 MG tablet Take 10 mg by mouth daily.    . methylPREDNISolone (MEDROL DOSEPAK) 4 MG TBPK tablet 6 day dose pack - take as directed 21 tablet 0  . pantoprazole (PROTONIX) 40 MG tablet Take 40 mg by mouth daily.    [name] PREVIDENT 0.2 % SOLN Take by mouth.    . rosuvastatin (CRESTOR) 10 MG tablet Take 10 mg by mouth daily.    . traMADol (ULTRAM) 50 MG tablet 1/2-1 tab every 6 hours as needed for pain x 5 days 20 tablet 0  . valACYclovir (VALTREX) 500 MG tablet Take 500 mg by mouth daily.     No current facility-administered medications for this visit.    REVIEW OF SYSTEMS:    10 Point review of Systems was done is negative except as noted above.   PHYSICAL EXAMINATION: ECOG PERFORMANCE STATUS: 0 - Asymptomatic  . Vitals:   04/27/20 1350  BP: (!) 138/97  Pulse: 60  Resp: 18  Temp: 97.8 F (36.6 C)  SpO2: 100%   Filed Weights   04/27/20 1350  Weight: 269 lb (122 kg)   .Body mass index is 36.48 kg/m.   GENERAL:alert, in no acute distress and comfortable SKIN: no acute rashes, no significant lesions EYES: conjunctiva are pink and non-injected, sclera anicteric OROPHARYNX: MMM, no exudates, no oropharyngeal erythema or ulceration NECK: supple, no JVD LYMPH:  no palpable lymphadenopathy in the cervical, axillary or inguinal regions LUNGS: clear to auscultation b/l with normal respiratory effort HEART: regular rate & rhythm ABDOMEN:  normoactive bowel sounds , non tender, not distended. Extremity: no pedal edema PSYCH: alert & oriented x 3 with fluent speech NEURO: no focal motor/sensory deficits    LABORATORY DATA:  I have reviewed the  data as listed  . CBC Latest Ref Rng & Units 04/27/2020 01/01/2020 12/10/2019  WBC 4.0 - 10.5 K/uL 6.9 7.7 8.6  Hemoglobin 13.0 - 17.0 g/dL 14.5 13.7 13.4  Hematocrit 39.0 - 52.0 % 42.7 40.3 39.0  Platelets 150 -  400 K/uL 167 236 175    . CMP Latest Ref Rng & Units 04/27/2020 02/03/2020 01/01/2020  Glucose 70 - 99 mg/dL 99 107(H) 78  BUN 6 - 20 mg/dL 11 14 15   Creatinine 0.61 - 1.24 mg/dL 1.30(H) 1.16 1.16  Sodium 135 - 145 mmol/L 139 138 138  Potassium 3.5 - 5.1 mmol/L 4.6 4.5 4.1  Chloride 98 - 111 mmol/L 106 108 103  CO2 22 - 32 mmol/L 27 25 27   Calcium 8.9 - 10.3 mg/dL 9.4 9.0 9.4  Total Protein 6.5 - 8.1 g/dL 7.4 - 7.3  Total Bilirubin 0.3 - 1.2 mg/dL 0.6 - 0.4  Alkaline Phos 38 - 126 U/L 71 - 83  AST 15 - 41 U/L 22 - 22  ALT 0 - 44 U/L 20 - 21     RADIOGRAPHIC STUDIES: I have personally reviewed the radiological images as listed and agreed with the findings in the report. No results found.  ASSESSMENT & PLAN:   50 yo with   1) Subsegmental Pulmonary embolism 2) RLE venous DVT  PLAN: -Discussed patient's most recent labs from 04/27/2020; blood counts completely normal, chemistries and D-dimer in progress. -Advised pt he is near the 6 month mark for anticoagulation. -Advised pt that we will continue 5mg  Eliquis BID until reach 6 month mark (February) and then transition to preventative 2.5mg  Eliquis BID for one year following.  d-dimer - wnl today. -Recommend pt stay physically active as much as possible given sciatic nerve pain and heel spur. -Recommend pt remain up-to-date with age-appropriate cancer screenings with PCP. -Continue smoking cessation. -Continue 5 mg Eliquis BID. -Will see back with PCP unless major changes.   FOLLOW UP: RTC with PCP  . No orders of the defined types were placed in this encounter.   All of the patients questions were answered with apparent satisfaction. The patient knows to call the clinic with any problems, questions or  concerns.   The total time spent in the appointment was 20 minutes and more than 50% was on counseling and direct patient cares.    Sullivan Lone MD [place] AAHIVMS [facility] Hematology/Oncology Physician [facility]  (Office):       [phone] (Work cell):  [phone] (Fax):           [phone]  04/26/2020 9:36 AM  I, Reinaldo Raddle, am acting as scribe for Dr. Sullivan Lone, MD.    .I have reviewed the above documentation for accuracy and completeness, and I agree with the above. Brunetta Genera MD

## 2020-04-27 ENCOUNTER — Inpatient Hospital Stay (HOSPITAL_BASED_OUTPATIENT_CLINIC_OR_DEPARTMENT_OTHER): Admitting: Hematology

## 2020-04-27 ENCOUNTER — Inpatient Hospital Stay: Attending: Hematology

## 2020-04-27 ENCOUNTER — Other Ambulatory Visit: Payer: Self-pay | Admitting: Hematology

## 2020-04-27 ENCOUNTER — Other Ambulatory Visit: Payer: Self-pay

## 2020-04-27 VITALS — BP 138/97 | HR 60 | Temp 97.8°F | Resp 18 | Ht 72.0 in | Wt 269.0 lb

## 2020-04-27 DIAGNOSIS — I82461 Acute embolism and thrombosis of right calf muscular vein: Secondary | ICD-10-CM

## 2020-04-27 DIAGNOSIS — Z86718 Personal history of other venous thrombosis and embolism: Secondary | ICD-10-CM | POA: Insufficient documentation

## 2020-04-27 DIAGNOSIS — I2699 Other pulmonary embolism without acute cor pulmonale: Secondary | ICD-10-CM | POA: Diagnosis not present

## 2020-04-27 DIAGNOSIS — Z86711 Personal history of pulmonary embolism: Secondary | ICD-10-CM | POA: Diagnosis present

## 2020-04-27 DIAGNOSIS — D6859 Other primary thrombophilia: Secondary | ICD-10-CM | POA: Diagnosis present

## 2020-04-27 LAB — CBC WITH DIFFERENTIAL/PLATELET
Abs Immature Granulocytes: 0.01 10*3/uL (ref 0.00–0.07)
Basophils Absolute: 0 10*3/uL (ref 0.0–0.1)
Basophils Relative: 1 %
Eosinophils Absolute: 0.2 10*3/uL (ref 0.0–0.5)
Eosinophils Relative: 3 %
HCT: 42.7 % (ref 39.0–52.0)
Hemoglobin: 14.5 g/dL (ref 13.0–17.0)
Immature Granulocytes: 0 %
Lymphocytes Relative: 42 %
Lymphs Abs: 2.9 10*3/uL (ref 0.7–4.0)
MCH: 29.2 pg (ref 26.0–34.0)
MCHC: 34 g/dL (ref 30.0–36.0)
MCV: 86.1 fL (ref 80.0–100.0)
Monocytes Absolute: 0.6 10*3/uL (ref 0.1–1.0)
Monocytes Relative: 8 %
Neutro Abs: 3.2 10*3/uL (ref 1.7–7.7)
Neutrophils Relative %: 46 %
Platelets: 167 10*3/uL (ref 150–400)
RBC: 4.96 MIL/uL (ref 4.22–5.81)
RDW: 13.7 % (ref 11.5–15.5)
WBC: 6.9 10*3/uL (ref 4.0–10.5)
nRBC: 0 % (ref 0.0–0.2)

## 2020-04-27 LAB — D-DIMER, QUANTITATIVE: D-Dimer, Quant: <0.27 ug/mL-FEU (ref 0.00–0.50)

## 2020-04-27 LAB — CMP (CANCER CENTER ONLY)
ALT: 20 U/L (ref 0–44)
AST: 22 U/L (ref 15–41)
Albumin: 4.2 g/dL (ref 3.5–5.0)
Alkaline Phosphatase: 71 U/L (ref 38–126)
Anion gap: 6 (ref 5–15)
BUN: 11 mg/dL (ref 6–20)
CO2: 27 mmol/L (ref 22–32)
Calcium: 9.4 mg/dL (ref 8.9–10.3)
Chloride: 106 mmol/L (ref 98–111)
Creatinine: 1.3 mg/dL — ABNORMAL HIGH (ref 0.61–1.24)
GFR, Estimated: >60 mL/min (ref >60)
Glucose, Bld: 99 mg/dL (ref 70–99)
Potassium: 4.6 mmol/L (ref 3.5–5.1)
Sodium: 139 mmol/L (ref 135–145)
Total Bilirubin: 0.6 mg/dL (ref 0.3–1.2)
Total Protein: 7.4 g/dL (ref 6.5–8.1)

## 2020-04-30 ENCOUNTER — Ambulatory Visit (INDEPENDENT_AMBULATORY_CARE_PROVIDER_SITE_OTHER): Admitting: *Deleted

## 2020-04-30 ENCOUNTER — Other Ambulatory Visit: Payer: Self-pay

## 2020-04-30 DIAGNOSIS — M722 Plantar fascial fibromatosis: Secondary | ICD-10-CM

## 2020-04-30 NOTE — Progress Notes (Signed)
Patient presents for the 2nd EPAT treatment today with complaint of plantar heel pain bilateral. Diagnosed with plantar fasciitis by Dr. Sherryle Lis. This has been ongoing for several months. The patient has tried ice, stretching, NSAIDS and supportive shoe gear with no long term relief.   Most of the pain is located plantar heel BILATERAL. Right is more lateral and left is more central. States he sees some improvement.  ESWT administered and tolerated well.Treatment settings initiated at:   Energy: 20  Ended treatment session today with 3000 shocks at the following settings:   Energy: 20  Frequency: 5.0  Joules: 19.63   Reviewed post EPAT instructions. Advised to avoid ice and NSAIDs throughout the treatment process and to utilize boot or supportive shoes for at least the next 3 days.  Follow up for 3rd treatment in 1 week.

## 2020-05-01 ENCOUNTER — Encounter: Payer: Self-pay | Admitting: Internal Medicine

## 2020-05-01 ENCOUNTER — Ambulatory Visit (INDEPENDENT_AMBULATORY_CARE_PROVIDER_SITE_OTHER): Admitting: Internal Medicine

## 2020-05-01 VITALS — BP 100/74 | HR 64 | Ht 70.75 in | Wt 269.1 lb

## 2020-05-01 DIAGNOSIS — Z860100 Personal history of colon polyps, unspecified: Secondary | ICD-10-CM

## 2020-05-01 DIAGNOSIS — Z7901 Long term (current) use of anticoagulants: Secondary | ICD-10-CM

## 2020-05-01 DIAGNOSIS — I2699 Other pulmonary embolism without acute cor pulmonale: Secondary | ICD-10-CM

## 2020-05-01 HISTORY — DX: Personal history of colon polyps, unspecified: Z86.0100

## 2020-05-01 HISTORY — DX: Personal history of colonic polyps: Z86.010

## 2020-05-01 NOTE — Patient Instructions (Signed)
Thank you for your service Sir!  We will be in touch about setting up a free pre-visit and colonoscopy appointment.    I appreciate the opportunity to care for you. Silvano Rusk, MD, [facility]

## 2020-05-01 NOTE — Progress Notes (Signed)
Casey Park 50 y.o. 1970-07-24 [id number]  Assessment & Plan:   Encounter Diagnoses  Name Primary?  Casey Park Hx of colonic polyps-sessile serrated polyps and adenomas Yes  . Embolism, pulmonary with infarction ([place])   . Long term current use of anticoagulant - apixaban    It is an appropriate time for a colonoscopy.  I am not sure it is an appropriate time to briefly hold his apixaban for 2 days prior to the colonoscopy.  I will check with his hematologist and if so we will schedule if not we wait till it is an appropriate time to hold that.  The risks and benefits as well as alternatives of endoscopic procedure(s) have been discussed and reviewed. All questions answered. The patient agrees to proceed. Rare but real risk of recurrent blood clot while off apixaban explained to the patient as well.  CC: Michael Boston, MD Dr. Carolyne Fiscal  Message from Dr. Irene Limbo regarding anticoagulation  He shall be done with 6 months of full dose anticoag end of feb and then 12 months of preventive dose ELiquis. He should okay to hold his anticoag for 48h anytime after February. Thanks  GK   We will schedule colonoscopy and hold Eliquis 2 days for March at patient's convenience. 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  Not having problems now.  He is on Eliquis for DVT/pulmonary embolus.  This was started in August of last year.  Shows that he had a colonoscopy in 2017 with one 1 cm sessile serrated polyp, and 7 other diminutive polyps that were 2 sessile serrated polyps and one tubular adenoma.  He had an EGD at that time and had laryngospasm and needed intubation but the EGD was normal.  He had a 1 year colonoscopy follow-up with a 12 mm sessile serrated polyp in the transverse colon and a diminutive sessile serrated polyp x2 in the transverse colon and a  diminutive rectal hyperplastic polyp.  Colonoscopy in 3 years was recommended.  Regarding GERD this is well controlled at this time.  No symptoms.  Pantoprazole 40 mg daily.  Gets care at the [facility] as well as in the [facility].  Followed in hematology for his pulmonary embolism DVT as well as through pulmonary with Dr. Lamonte Sakai. No Known Allergies Current Meds  Medication Sig  . albuterol (VENTOLIN HFA) 108 (90 Base) MCG/ACT inhaler Inhale 2 puffs into the lungs as needed for wheezing or shortness of breath.  Casey Park apixaban (ELIQUIS) 5 MG TABS tablet Take 1 tablet (5 mg total) by mouth 2 (two) times daily.  . ASPIRIN LOW DOSE 81 MG EC tablet Take 81 mg by mouth daily.  Casey Park buPROPion (WELLBUTRIN XL) 150 MG 24 hr tablet Take 150 mg by mouth every morning.  . gabapentin (NEURONTIN) 100 MG capsule Take 100 mg by mouth 3 (three) times daily.  Casey Park lisinopril (ZESTRIL) 10 MG tablet Take 10 mg by mouth daily.  . pantoprazole (PROTONIX) 40 MG tablet Take 40 mg by mouth daily.  Casey Park PREVIDENT 0.2 % SOLN Take by mouth.  . rosuvastatin (CRESTOR) 10 MG tablet Take 10 mg by mouth daily.  . tadalafil (CIALIS) 10 MG tablet Take 1 tablet by mouth as needed.  . traMADol (ULTRAM) 50 MG tablet 1/2-1 tab every 6 hours as needed for pain x 5 days  . valACYclovir (VALTREX) 500 MG  tablet Take 500 mg by mouth daily.   Past Medical History:  Diagnosis Date  . Anxiety   . Arnold-Chiari malformation, type I ([place])   . DVT (deep venous thrombosis) ([place])   . GERD (gastroesophageal reflux disease)   . GERD (gastroesophageal reflux disease)   . Hyperlipidemia   . Hypertension   . Lumbago   . Migraines    w/o [place]  . Nicotine use disorder   . OSA (obstructive sleep apnea)    on CPAP  . PTSD (post-traumatic stress disorder)   . Pulmonary embolism ([place])   . Trigeminal neuralgia    left side  . Tubular adenoma    Past Surgical History:  Procedure Laterality Date  . ANTERIOR CRUCIATE LIGAMENT REPAIR Right   . FOOT  SURGERY Right   . 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   Review of Systems Some sleep disturbance with PTSD some back pain otherwise negative  Objective:   Physical Exam BP 100/74 (BP Location: Left Arm, Patient Position: Sitting, Cuff Size: Normal)   Pulse 64   Ht 5' 10.75" (1.797 m) Comment: height measured without shoes  Wt 269 lb 2 oz (122.1 kg)   BMI 37.80 kg/m  Well-developed muscular black man in no acute distress Lungs are clear Heart sounds are normal S1-S2 no rubs murmurs or gallops Abdomen is soft and nontender without organomegaly or mass

## 2020-05-08 ENCOUNTER — Telehealth: Payer: Self-pay

## 2020-05-08 NOTE — Telephone Encounter (Signed)
I spoke with Casey Park who stated that he talked with the [facility] after his visit with Dr Carlean Purl and they told him they would send a referral over for him to get the colonoscopy done here. I will be on the lookout for these papers and contact him back to set up his appointments.

## 2020-05-08 NOTE — Telephone Encounter (Signed)
-----   Message from Gatha Mayer, MD sent at 05/06/2020  5:53 PM EST ----- Regarding: FW: Another anticoagulation question  Please see below regarding this patient.  Tell him he can schedule his colonoscopy for March or April if he wants and we will hold his Eliquis for 48 hours.  I put this in my note so previsit we will see it   ----- Message ----- From: Brunetta Genera, MD Sent: 05/06/2020   9:58 AM EST To: Gatha Mayer, MD, Rolland Bimler, RN Subject: RE: Another anticoagulation question           He shall be done with 6 months of full dose anticoag end of feb and then 12 months of preventive dose ELiquis. He should okay to hold his anticoag for 48h anytime after February. Thanks Bartholomew ----- Message ----- From: Gatha Mayer, MD Sent: 05/01/2020   5:48 PM EST To: Brunetta Genera, MD Subject: Another anticoagulation question               This patient needs a colonoscopy though it is not urgent.  He has a history of colon polyps.  At what point would be an acceptable risk to hold his apixaban for 2 days prior to colonoscopy.  I think having a colonoscopy at some point this year makes sense so we do have some time.  Thank you for your help.  Best regards,  Glendell Docker

## 2020-05-11 ENCOUNTER — Ambulatory Visit (INDEPENDENT_AMBULATORY_CARE_PROVIDER_SITE_OTHER): Admitting: *Deleted

## 2020-05-11 ENCOUNTER — Other Ambulatory Visit: Payer: Self-pay

## 2020-05-11 DIAGNOSIS — B351 Tinea unguium: Secondary | ICD-10-CM

## 2020-05-11 DIAGNOSIS — M722 Plantar fascial fibromatosis: Secondary | ICD-10-CM

## 2020-05-11 NOTE — Progress Notes (Signed)
Patient presents for the 3rd EPAT treatment today with complaint of plantar heel pain bilateral. Diagnosed with plantar fasciitis by Dr. Sherryle Lis. This has been ongoing for several months. The patient has tried ice, stretching, NSAIDS and supportive shoe gear with no long term relief.   Most of the pain is located plantar heel BILATERAL. Right is more lateral and left is more central. He sees some improvement.  ESWT administered and tolerated well.Treatment settings initiated at:   Energy: 25  Ended treatment session today with 3000 shocks at the following settings:   Energy: 25  Frequency: 4.0  Joules: 24.52   Reviewed post EPAT instructions. Advised to avoid ice and NSAIDs throughout the treatment process and to utilize boot or supportive shoes for at least the next 3 days.  Follow up for 4th treatment in 2 weeks.

## 2020-05-14 ENCOUNTER — Encounter: Payer: Self-pay | Admitting: Internal Medicine

## 2020-05-14 NOTE — Telephone Encounter (Signed)
We got his [facility] papers and he has appointments set up for pre-op 06/23/20 and his colonoscopy on4/07/2020.

## 2020-06-09 ENCOUNTER — Other Ambulatory Visit: Payer: Self-pay

## 2020-06-09 ENCOUNTER — Encounter: Payer: Self-pay | Admitting: Podiatry

## 2020-06-09 ENCOUNTER — Ambulatory Visit (INDEPENDENT_AMBULATORY_CARE_PROVIDER_SITE_OTHER): Admitting: Podiatry

## 2020-06-09 DIAGNOSIS — M216X2 Other acquired deformities of left foot: Secondary | ICD-10-CM | POA: Diagnosis not present

## 2020-06-09 DIAGNOSIS — M722 Plantar fascial fibromatosis: Secondary | ICD-10-CM

## 2020-06-09 DIAGNOSIS — M216X1 Other acquired deformities of right foot: Secondary | ICD-10-CM

## 2020-06-09 DIAGNOSIS — M21861 Other specified acquired deformities of right lower leg: Secondary | ICD-10-CM

## 2020-06-09 DIAGNOSIS — M21862 Other specified acquired deformities of left lower leg: Secondary | ICD-10-CM

## 2020-06-09 NOTE — Progress Notes (Signed)
Subjective:  Patient ID: Casey Park, male    DOB: 01/24/1971,  MRN: [id number]  Chief Complaint  Patient presents with  . Foot Pain    PT stated that he is still having pain with both feet he stated that the EPAT did not help    50 y.o. male returns with the above complaint. History confirmed with patient.  He has completed 3 sessions of EPAT and it did not help at all.  He thinks he may be ready to plan for surgery  Objective:  Physical Exam: warm, good capillary refill, no trophic changes or ulcerative lesions, normal DP and PT pulses and normal sensory exam. Bilaterally he has gastrocnemius equinus  Left Foot: point tenderness over the heel pad  Right Foot: point tenderness over the heel pad   Radiographs: X-ray of both feet: no fracture, dislocation, swelling or degenerative changes noted and plantar calcaneal spur bilaterally  IMPRESSION: Right heel:  1. Findings suggestive of mild plantar fasciitis. 2. Tendinosis with longitudinal split tear of the peroneus brevis tendon. 3. Focal full-thickness cartilage defect at the medial talar shoulder measuring at least 8 mm in transverse dimension with underlying subchondral cystic changes. 4. Thickened appearance of the superomedial calcaneonavicular ligament, which may be degenerative or related to remote injury.  IMPRESSION: Left heel:  1. Findings suggestive of mild plantar fasciitis. 2. Tiny interstitial tear within the retro-malleolar aspect of the peroneus longus tendon. Tendinosis of the infra-malleolar peroneus brevis tendon without definite split tear. 3. Mild focal subchondral marrow edema at the midportion of the medial talar shoulder without definite overlying cartilage defect.  Assessment:   No diagnosis found.   Plan:  Patient was evaluated and treated and all questions answered.  We again reviewed the clinical exam findings and his previous imaging studies indicated he has plantar fasciitis.  This  is gastrocnemius equinus and we discussed how this relates to the progression and worsening of plantar fasciitis.  At this point he has failed multiple nonsurgical treatments including injections, physical therapy, bracing and shockwave therapy.  He would like to proceed with surgical intervention.  We will start with the left foot followed by the right foot.  He would like to plan for this in late May.  Surgical we discussed the following procedures: Endoscopic plantar fascial release and endoscopic gastrocnemius recession.  We discussed the risk, benefits and potential complications including but not limited to pain, swelling, infection, scar, numbness which may be temporary or permanent, chronic pain, stiffness, nerve pain or damage, wound healing problems.  We also discussed the risk of DVT and PE which he is at risk for because of his history of these previously.  He does see hematologist and pulmonologist and will have the recommendations prior to surgery.  He currently takes Eliquis.  Informed consent was signed and reviewed all questions were addressed.  He will return in 2 months for his final presurgical planning visit prior to surgery   Surgical plan:  Procedure: -Left lower extremity endoscopic plantar fascial release and endoscopic Strayer gastrocnemius recession  Location: -[place]  Anesthesia plan: -IV sedation with regional block  Postoperative pain plan: - Tylenol 1000 mg every 6 hours, gabapentin 300 mg every 8 hours x5 days, oxycodone 5 mg 1-2 tabs every 6 hours only as needed,   DVT prophylaxis: -will defer anticoagulation bridging to his hematologist, he will resume his Eliquis after surgery  WB Restrictions / DME needs: -WBAT in a CAM boot.     Return  in about 8 weeks (around 08/04/2020).

## 2020-06-12 ENCOUNTER — Other Ambulatory Visit: Payer: Self-pay

## 2020-06-12 ENCOUNTER — Ambulatory Visit (INDEPENDENT_AMBULATORY_CARE_PROVIDER_SITE_OTHER)
Admission: RE | Admit: 2020-06-12 | Discharge: 2020-06-12 | Disposition: A | Discharge status: Home / Self Care | Source: Ambulatory Visit | Attending: Emergency Medicine | Admitting: Emergency Medicine

## 2020-06-12 DIAGNOSIS — I2699 Other pulmonary embolism without acute cor pulmonale: Secondary | ICD-10-CM | POA: Diagnosis not present

## 2020-06-12 IMAGING — CT CT CHEST W/O CM
2 of 4 series · 15 of 36 positions shown, 18 images · non-contrast
Comparison: [DATE]

CLINICAL DATA: Concern for pulmonary infarct

EXAM:
CT CHEST WITHOUT CONTRAST
TECHNIQUE: Multidetector CT imaging of the chest was performed following the
standard protocol without IV contrast.

[Series 2: thorax · axial · 0.77mm/px · z∈[-345,-77]mm · 12 of 160 slices shown, 15 images]
[im 13/160  mediastinal]
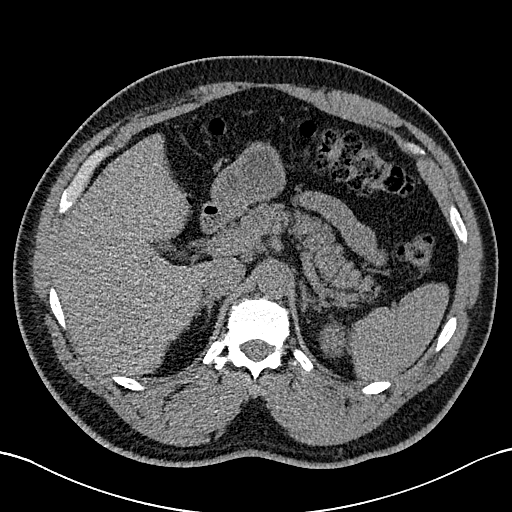
[im 13/160  lung]
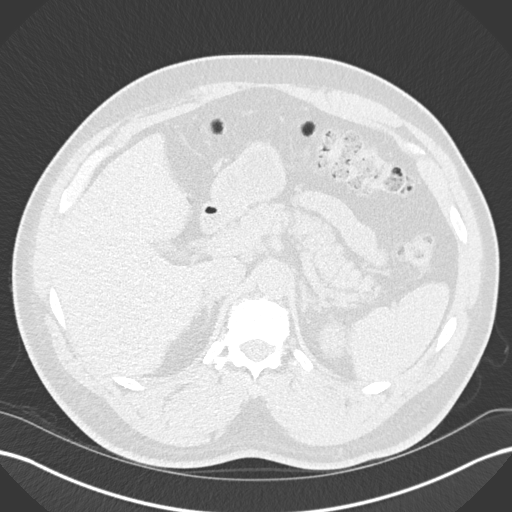
[im 25/160  lung]
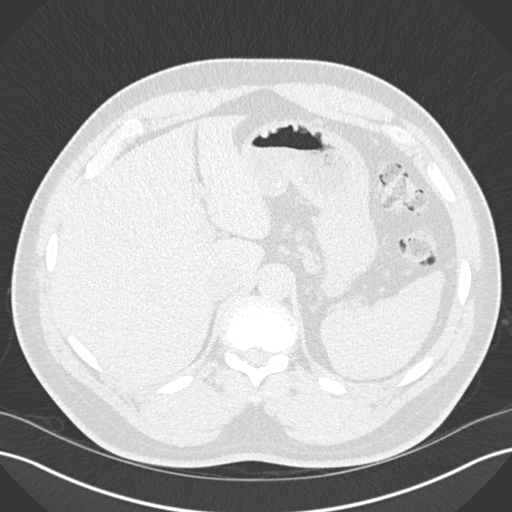
[im 37/160  lung]
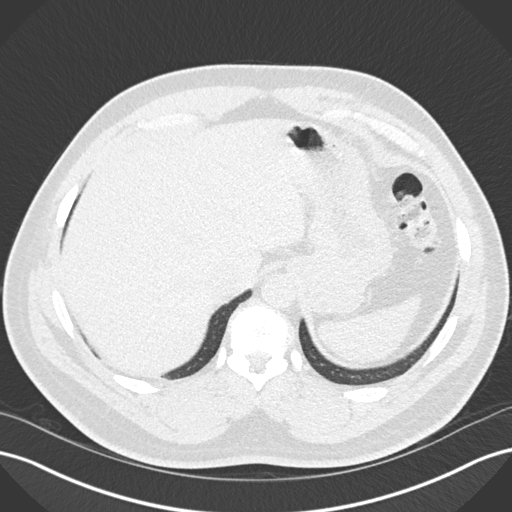
[im 49/160  lung]
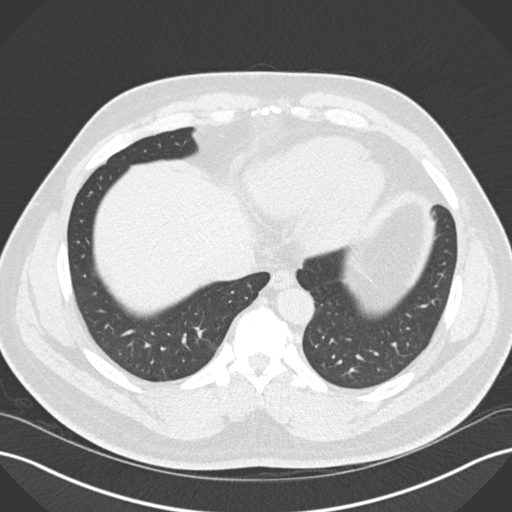
[im 62/160  mediastinal]
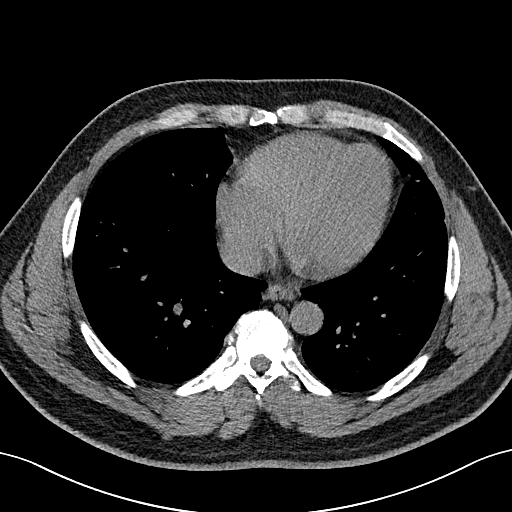
[im 62/160  lung]
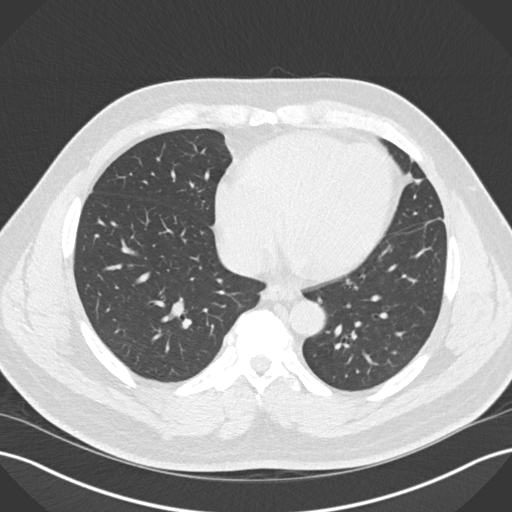
[im 74/160  lung]
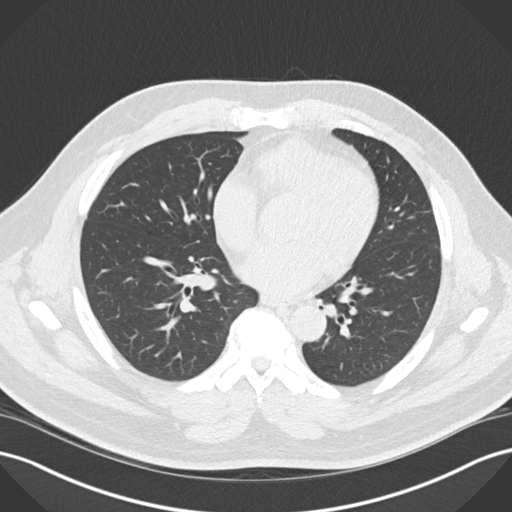
[im 86/160  lung]
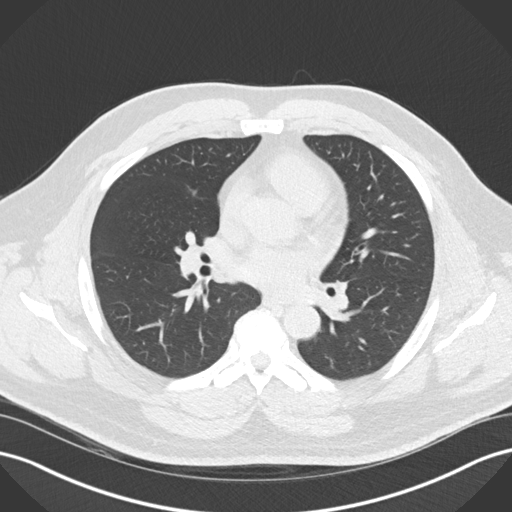
[im 98/160  lung]
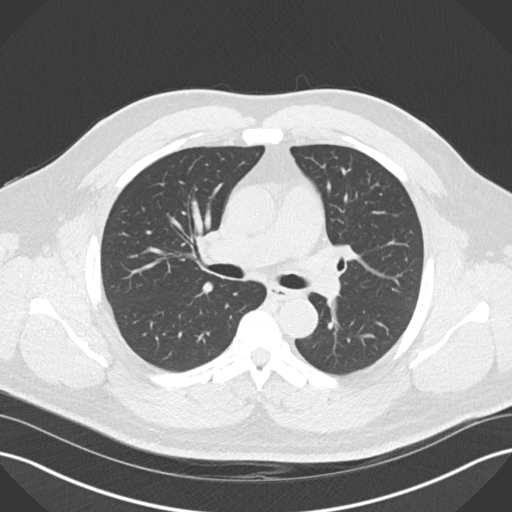
[im 111/160  mediastinal]
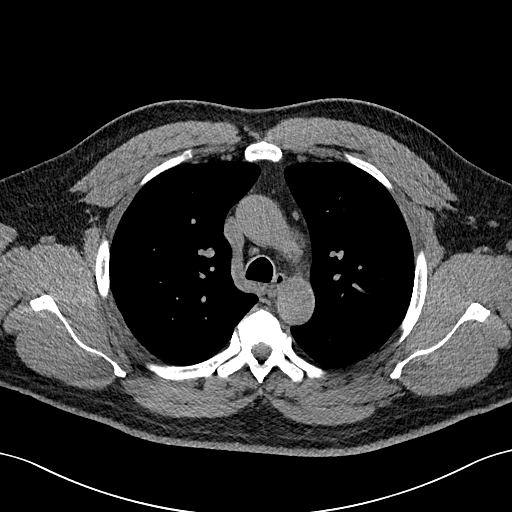
[im 111/160  lung]
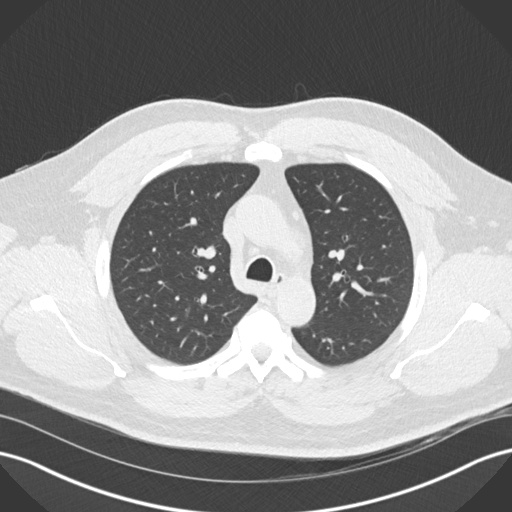
[im 123/160  lung]
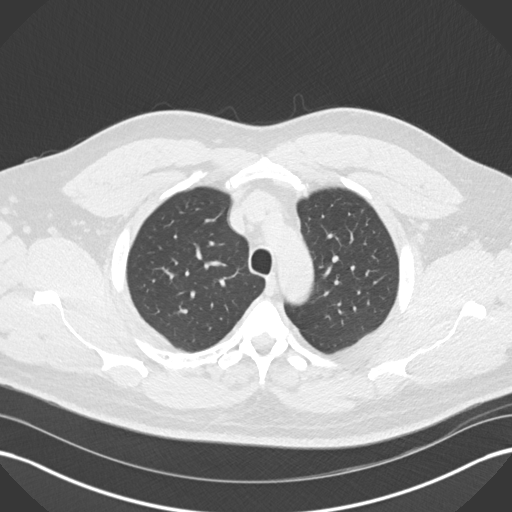
[im 135/160  lung]
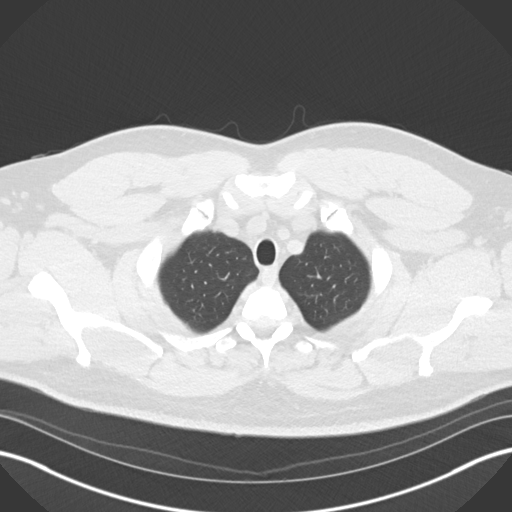
[im 147/160  lung]
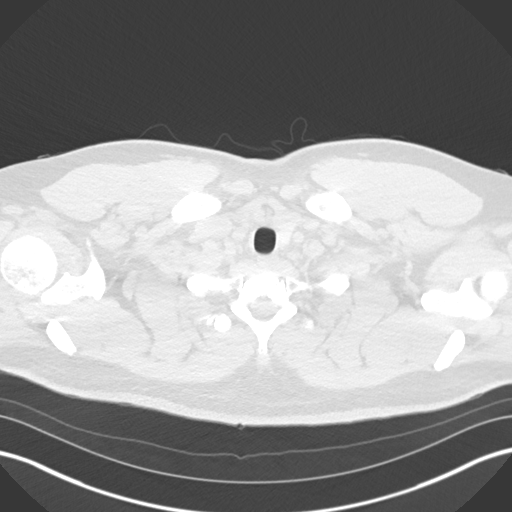

[Series 5: coronal · coronal · 0.62mm/px · 3 of 132 slices shown]
[im 27/132  lung]
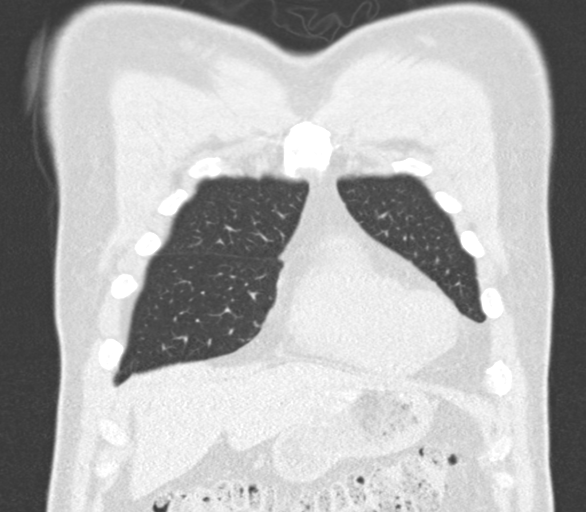
[im 53/132  lung]
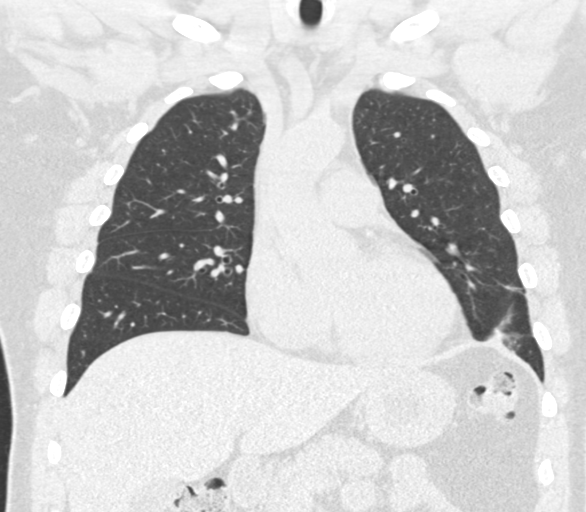
[im 79/132  lung]
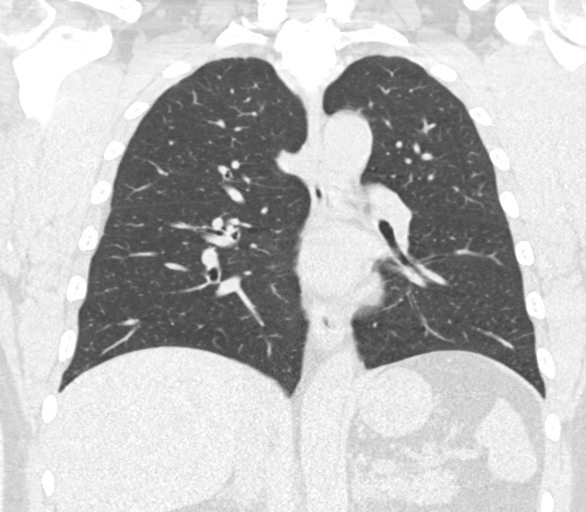

[15 of 36 positions shown; findings below may reference images not displayed]

FINDINGS: Cardiovascular: Limited without IV contrast. Negative for aneurysm.
No mediastinal hemorrhage or hematoma. Normal heart size. No
pericardial effusion.

Mediastinum/Nodes: No enlarged mediastinal or axillary lymph nodes.
Thyroid gland, trachea, and esophagus demonstrate no significant
findings.

Lungs/Pleura: Similar areas of bandlike scarring in the inferior
lingula and also the dependent right lower lobe.

No acute airspace process, significant collapse or consolidation. No
interstitial change or edema.

Trachea and central airways are patent. No suspicious pulmonary
nodule or mass.

No pleural abnormality, effusion or pneumothorax.

Upper Abdomen: No acute abnormality.

Musculoskeletal: Minimal endplate degenerative changes of the
thoracic spine. No acute osseous finding or fracture. Intact
sternum. No chest wall soft tissue abnormality or asymmetry.
IMPRESSION: Stable minor inferior lingula and right basilar scarring.

Otherwise no acute intrathoracic finding by noncontrast CT.

## 2020-06-23 ENCOUNTER — Ambulatory Visit (AMBULATORY_SURGERY_CENTER): Payer: Self-pay

## 2020-06-23 ENCOUNTER — Other Ambulatory Visit: Payer: Self-pay

## 2020-06-23 VITALS — Ht 70.75 in | Wt 270.0 lb

## 2020-06-23 DIAGNOSIS — Z86010 Personal history of colonic polyps: Secondary | ICD-10-CM

## 2020-06-23 MED ORDER — PEG-KCL-NACL-NASULF-NA ASC-C 100 G PO SOLR: 1.0000 | Freq: Once | ORAL | 0 refills | Status: AC

## 2020-06-23 NOTE — Progress Notes (Signed)
No egg or soy allergy known to patient  No issues with past sedation with any surgeries or procedures Patient denies ever being told they had issues or difficulty with intubation  No FH of Malignant Hyperthermia No diet pills per patient No home 02 use per patient   PT TAKING ELIQUIS , ORDERS TO HOLD FOR 48 HOURS ARE IN THE CHART, PT INSTRUCTED TO TAKE LAST DOSE ON 4/1 AND NONE ON 4/2 OR 4/3, THEN DR GESSNER WILL TELL HIM WHEN TO RESTART ELIQUIS.   Pt denies issues with constipation  No A fib or A flutter  EMMI video to pt or via [place] 19 guidelines implemented in PV today with Pt and RN  Pt is fully vaccinated  for Covid   IN[facility]CT [facility] IF HE [facility] NOT RECEIVE HIS PREP BY Monday 3/28 AND HE MAY NEED TO GO THERE TO PICK UP IF THEY HAVEN'T SENT IT.    NO PA's for preps discussed with pt In PV today  Discussed with pt there will be an out-of-pocket cost for prep and that varies from $0 to 70 dollars   Due to the COVID-19 pandemic we are asking patients to follow certain guidelines.  Pt aware of COVID protocols and [facility] guidelines

## 2020-06-26 ENCOUNTER — Ambulatory Visit (HOSPITAL_COMMUNITY)
Admission: RE | Admit: 2020-06-26 | Discharge: 2020-06-26 | Disposition: A | Discharge status: Home / Self Care | Source: Ambulatory Visit | Attending: Emergency Medicine | Admitting: Emergency Medicine

## 2020-06-26 ENCOUNTER — Other Ambulatory Visit: Payer: Self-pay

## 2020-06-26 DIAGNOSIS — I272 Pulmonary hypertension, unspecified: Secondary | ICD-10-CM | POA: Diagnosis present

## 2020-06-26 DIAGNOSIS — I10 Essential (primary) hypertension: Secondary | ICD-10-CM | POA: Diagnosis not present

## 2020-06-26 DIAGNOSIS — Z86711 Personal history of pulmonary embolism: Secondary | ICD-10-CM | POA: Diagnosis not present

## 2020-06-26 DIAGNOSIS — E785 Hyperlipidemia, unspecified: Secondary | ICD-10-CM | POA: Insufficient documentation

## 2020-06-26 DIAGNOSIS — K219 Gastro-esophageal reflux disease without esophagitis: Secondary | ICD-10-CM | POA: Diagnosis not present

## 2020-06-26 DIAGNOSIS — G4730 Sleep apnea, unspecified: Secondary | ICD-10-CM | POA: Insufficient documentation

## 2020-06-26 LAB — ECHOCARDIOGRAM COMPLETE
Area-P 1/2: 3.37 cm2
S' Lateral: 3.8 cm

## 2020-06-26 NOTE — Progress Notes (Signed)
  Echocardiogram 2D Echocardiogram has been performed.  Casey Park 06/26/2020, 10:52 AM

## 2020-06-30 NOTE — Telephone Encounter (Signed)
I reviewed the echocardiogram from 3/26 and CT chest from 3/13 with Sam by phone.  His echocardiogram shows that his RV size is normalized.  PA pressure could not be estimated but there is no evidence for PAH.  Good news.  CT chest shows some subtle residual left basilar scar from his pulmonary infarct.  No other abnormalities.  Again good news.

## 2020-06-30 NOTE — Telephone Encounter (Signed)
Dr Lamonte Sakai, please advise on pt email:  Good morning Dr. Lamonte Sakai,   Please reach out to me regarding the results of my echocardiogram.  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  It somewhat freaked me out but I am waiting to hear back from you.  I can be reached at [phone] when you have the chance.  Thanks and I look forward to hearing back from  you.  Regards, Casey Park

## 2020-07-06 ENCOUNTER — Other Ambulatory Visit: Payer: Self-pay

## 2020-07-06 ENCOUNTER — Encounter: Payer: Self-pay | Admitting: Internal Medicine

## 2020-07-06 ENCOUNTER — Ambulatory Visit (AMBULATORY_SURGERY_CENTER): Admitting: Internal Medicine

## 2020-07-06 VITALS — BP 97/56 | HR 60 | Temp 96.6°F | Resp 18 | Ht 70.75 in | Wt 270.0 lb

## 2020-07-06 DIAGNOSIS — K635 Polyp of colon: Secondary | ICD-10-CM

## 2020-07-06 DIAGNOSIS — D125 Benign neoplasm of sigmoid colon: Secondary | ICD-10-CM

## 2020-07-06 DIAGNOSIS — Z86010 Personal history of colonic polyps: Secondary | ICD-10-CM

## 2020-07-06 MED ORDER — SODIUM CHLORIDE 0.9 % IV SOLN: 500.0000 mL | Freq: Once | INTRAVENOUS | Status: AC

## 2020-07-06 NOTE — Progress Notes (Signed)
Called to room to assist during endoscopic procedure.  Patient ID and intended procedure confirmed with present staff. Received instructions for my participation in the procedure from the performing physician.  

## 2020-07-06 NOTE — Op Note (Signed)
[facility] Patient Name: Casey Park Procedure Date: 07/06/2020 8:01 AM MRN: [id number] Endoscopist: Gatha Mayer , MD Age: 50 Referring MD:  Date of Birth: May 02, 1970 Gender: Male Account #: [id number] Procedure:                Colonoscopy Indications:              Surveillance: Personal history of adenomatous                            polyps on last colonoscopy > 3 years ago, Last                            colonoscopy: 2018 Medicines:                Propofol per Anesthesia Procedure:                Pre-Anesthesia Assessment:                           - Prior to the procedure, a History and Physical                            was performed, and patient medications and                            allergies were reviewed. The patient's tolerance of                            previous anesthesia was also reviewed. The risks                            and benefits of the procedure and the sedation                            options and risks were discussed with the patient.                            All questions were answered, and informed consent                            was obtained. Prior Anticoagulants: The patient                            last took Eliquis (apixaban) 2 days prior to the                            procedure. ASA Grade Assessment: II - A patient                            with mild systemic disease. After reviewing the                            risks and benefits, the patient was deemed in  satisfactory condition to undergo the procedure.                           After obtaining informed consent, the colonoscope                            was passed under direct vision. Throughout the                            procedure, the patient's blood pressure, pulse, and                            oxygen saturations were monitored continuously. The                            Olympus CF-HQ190L (Serial# [id number]) Colonoscope was                             introduced through the anus and advanced to the the                            cecum, identified by appendiceal orifice and                            ileocecal valve. The colonoscopy was performed                            without difficulty. The patient tolerated the                            procedure well. The quality of the bowel                            preparation was good. The ileocecal valve,                            appendiceal orifice, and rectum were photographed.                            The bowel preparation used was MoviPrep via split                            dose instruction. Scope In: 8:43:57 AM Scope Out: 8:59:51 AM Scope Withdrawal Time: 0 hours 13 minutes 27 seconds  Total Procedure Duration: 0 hours 15 minutes 54 seconds  Findings:                 The perianal and digital rectal examinations were                            normal. Pertinent negatives include normal prostate                            (size, shape, and consistency).  Four sessile polyps were found in the sigmoid                            colon. The polyps were diminutive in size. These                            polyps were removed with a cold snare. Resection                            and retrieval were complete. Verification of                            patient identification for the specimen was done.                            Estimated blood loss was minimal.                           The exam was otherwise without abnormality on                            direct and retroflexion views. Complications:            No immediate complications. Estimated Blood Loss:     Estimated blood loss: none. Impression:               - Four diminutive polyps in the sigmoid colon,                            removed with a cold snare. Resected and retrieved.                           - The examination was otherwise normal on direct                             and retroflexion views.                           - Personal history of colonic polyps. 2017 3 ssp                            one adenoma 10 mm ssp max, 2018 3 ssp max 29mm Recommendation:           - Patient has a contact number available for                            emergencies. The signs and symptoms of potential                            delayed complications were discussed with the                            patient. Return to normal activities tomorrow.  Written discharge instructions were provided to the                            patient.                           - Resume previous diet.                           - Continue present medications.                           - Resume Eliquis (apixaban) at prior dose tomorrow.                           - Repeat colonoscopy is recommended for                            surveillance. The colonoscopy date will be                            determined after pathology results from today's                            exam become available for review. Gatha Mayer, MD 07/06/2020 9:08:14 AM This report has been signed electronically.

## 2020-07-06 NOTE — Progress Notes (Signed)
Pt's states no medical or surgical changes since previsit or office visit.  [facility] - vitals 

## 2020-07-06 NOTE — Progress Notes (Signed)
A and O x3. Report to RN. Tolerated MAC anesthesia well.

## 2020-07-06 NOTE — Patient Instructions (Addendum)
I found and removed 4 tiny polyps today.  I will let you know pathology results and when to have another routine colonoscopy by mail and/or My Chart.  Restart Eliquis tomorrow.  I appreciate the opportunity to care for you. Gatha Mayer, MD, [facility]  Polyp handout given to patient.  Resume previous diet. Continue present medications.  Resume Eliquis at prior dose tomorrow.  Repeat colonoscopy recommended for surveillance.  Date to be determined after pathology results reviewed.  YOU HAD AN ENDOSCOPIC PROCEDURE TODAY AT [facility]:   Refer to the procedure report that was given to you for any specific questions about what was found during the examination.  If the procedure report does not answer your questions, please call your gastroenterologist to clarify.  If you requested that your care partner not be given the details of your procedure findings, then the procedure report has been included in a sealed envelope for you to review at your convenience later.  YOU SHOULD EXPECT: Some feelings of bloating in the abdomen. Passage of more gas than usual.  Walking can help get rid of the air that was put into your GI tract during the procedure and reduce the bloating. If you had a lower endoscopy (such as a colonoscopy or flexible sigmoidoscopy) you may notice spotting of blood in your stool or on the toilet paper. If you underwent a bowel prep for your procedure, you may not have a normal bowel movement for a few days.  Please Note:  You might notice some irritation and congestion in your nose or some drainage.  This is from the oxygen used during your procedure.  There is no need for concern and it should clear up in a day or so.  SYMPTOMS TO REPORT IMMEDIATELY:   Following lower endoscopy (colonoscopy or flexible sigmoidoscopy):  Excessive amounts of blood in the stool  Significant tenderness or worsening of abdominal pains  Swelling of the abdomen that is new,  acute  Fever of 100F or higher  For urgent or emergent issues, a gastroenterologist can be reached at any hour by calling [phone]. Do not use MyChart messaging for urgent concerns.    DIET:  We do recommend a small meal at first, but then you may proceed to your regular diet.  Drink plenty of fluids but you should avoid alcoholic beverages for 24 hours.  ACTIVITY:  You should plan to take it easy for the rest of today and you should NOT DRIVE or use heavy machinery until tomorrow (because of the sedation medicines used during the test).    FOLLOW UP: Our staff will call the number listed on your records 48-72 hours following your procedure to check on you and address any questions or concerns that you may have regarding the information given to you following your procedure. If we do not reach you, we will leave a message.  We will attempt to reach you two times.  During this call, we will ask if you have developed any symptoms of COVID 19. If you develop any symptoms (ie: fever, flu-like symptoms, shortness of breath, cough etc.) before then, please call [phone].  If you test positive for Covid 19 in the 2 weeks post procedure, please call and report this information to [place].    If any biopsies were taken you will be contacted by phone or by letter within the next 1-3 weeks.  Please call us at [phone] if you have not heard about the biopsies  in 3 weeks.    SIGNATURES/CONFIDENTIALITY: You and/or your care partner have signed paperwork which will be entered into your electronic medical record.  These signatures attest to the fact that that the information above on your After Visit Summary has been reviewed and is understood.  Full responsibility of the confidentiality of this discharge information lies with you and/or your care-partner.

## 2020-07-07 ENCOUNTER — Ambulatory Visit: Admitting: Podiatry

## 2020-07-08 ENCOUNTER — Telehealth: Payer: Self-pay

## 2020-07-08 NOTE — Telephone Encounter (Signed)
  Follow up Call-  Call back number 07/06/2020  Post procedure Call Back phone  # [phone]  Permission to leave phone message Yes  Some recent data might be hidden     Patient questions:  Do you have a fever, pain , or abdominal swelling? Yes.   Pain Score  0 *  Have you tolerated food without any problems? No.  Have you been able to return to your normal activities? No.  Do you have any questions about your discharge instructions: Diet   No. Medications  No. Follow up visit  No.  Do you have questions or concerns about your Care? No.  Actions: * If pain score is 4 or above: No action needed, pain <4.  1. Have you developed a fever since your procedure? No  2.   Have you had an respiratory symptoms (SOB or cough) since your procedure? No  3.   Have you tested positive for COVID 19 since your procedure No 4.   Have you had any family members/close contacts diagnosed with the COVID 19 since your procedure? No   If yes to any of these questions please route to Joylene John, RN and Joella Prince, RN

## 2020-07-13 ENCOUNTER — Telehealth: Payer: Self-pay

## 2020-07-13 NOTE — Telephone Encounter (Signed)
Contacted pt to let him know that he did not need to come in for his appointment tomorrow. Informed pt his PCP Dr Cristie Hem would follow his eliquis going forward. Pt stated he is going on a trip for a week and would be on a long flight. Per Dr Irene Limbo , pt to remain on eliquis 5 mg po bid until he returns and then start 2.5 mg po bid. Pt also encouraged to make an appointment to see PCP as soon as MD can see him. Pt acknowledged and verbalized understanding.

## 2020-07-13 NOTE — Progress Notes (Incomplete)
HEMATOLOGY/ONCOLOGY CONSULTATION NOTE  Date of Service: 07/13/2020  Patient Care Team: Michael Boston, MD as PCP - General (Internal Medicine)  CHIEF COMPLAINTS/PURPOSE OF CONSULTATION:  PE/DVT  HISTORY OF PRESENTING ILLNESS:   Casey Park is a wonderful 50 y.o. male who has been referred to [place] for evaluation and management of pulmonary embolism/deep vein thrombosis. The pt reports that he is doing well overall.   The pt reports that he was diagnosed with HTN and placed on Lisinopril 7-8 years ago, he does not remember ever being diagnosed with HLD, but was placed on Crestor preventatively. He was also taking a daily baby Aspirin at the time of his PE/DVT. Pt has a history of acid reflux and has been using Pantoprazole as needed. He has no prior history of blood clots.   Pt had a Colonoscopy nearly 6 years ago, where several adenomatous polyps were found. He had a follow up Colonoscopy the next year and was told to follow up in five years. Pt is scheduled for a repeat Colonoscopy next year. Pt denies any GI symptoms.   About a month ago pt did a two mile run and began to notice swelling in his left leg. A few days later he began experiencing a pain in his left scapula. This occurred on a Friday and he spent the following weekend at [facility] with some friends. During the weekend he played golf and participated in other activities with no issue. On Sunday night he began to feel painful chest pressure when laying down. This improved when he sat up. Pt denies sweating, shortness of breath, or palpitations at the time. Pt took Pantoprazole as he was concerned that it was reflux. His symptoms resolved the next day, but he came to the [facility] after returning home as a precaution. While in the hospital they completed CT scans and US Venous studies revealing his PE and right-sided DVT. After he was given anticoagulants he began to feel short of breath. He was discharged on Eliquis. Pt  continued having sharp, stabbing pain in his back when laying down at night after discharge. Pt began smoking marijuana every couple of days, which improved his discomfort. He is not currently experiencing any chest pain, back pain, or shortness of breath. He is able to walk 5 miles at least 6 days per week. Pt coughed up dark red blood nearly a week ago. Pt is scheduled to see Dr. Lamonte Sakai on 01/13/20.  He has had no fevers, chills, night sweats, unusual fatigue, or unexpected weight loss and denies taking any steroids or being on any hormonal therapies prior to his clot. Pt was experiencing a significant amount of stress prior to this event. Pt completed his vaccination against COVID19 in April. He denies any recent cough, cold, or any other respiratory symptoms.  Of note prior to the patient's visit today, pt has had CT Angio Chest ([id number]) completed on 12/09/2019 with results revealing "1. Poor quality exam for evaluation of pulmonary embolism. Suspicion of a subsegmental pulmonary embolism to the anterior left upper lobe. Of questionable clinical significance. Otherwise, no evidence of pulmonary embolism to the large lobar level. 2. Subtle peripheral ground-glass opacities which are suspicious for mild COVID-19 pneumonia. 3. Dense consolidation in the left lung base which could represent a focus of infection or infarct if the patient has pulmonary emboli. As this is plain film visible, recommend radiographic follow-up to confirm resolution. 4. Trace bilateral pleural fluid."   Of note since  the patient's last visit, pt has had Lower Venous DVT Study ([id number]) completed on 12/10/2019 with results revealing RIGHT: - Findings consistent with acute deep vein thrombosis involving the right gastrocnemius veins. - No cystic structure found in the popliteal fossa. LEFT: - There is no evidence of deep vein thrombosis in the lower extremity. - No cystic structure found in the popliteal fossa."  Of note  since the patient's last visit, pt has had ECHO completed on 12/10/2019 with results revealing Normal ejection fraction.  Of note since the patient's last visit, pt has had CT Angio Chest ([id number]) completed on 12/18/2019 with results revealing "No evidence of pulmonary embolism. Previously identified pulmonary embolus in LEFT upper lobe no longer seen. Tiny RIGHT pleural effusion. Atelectasis versus consolidation in lingula and posterior base of RIGHT lower lobe."  Most recent lab results (12/10/2019) of CBC is as follows: all values are WNL except for Glucose at 101, Calcium at 8.4. 12/10/2019 CRP at 4.8 12/10/2019 LDH at 227 12/10/2019 Sed Rate at 26 12/10/2019 Fibrinogen at 504 12/10/2019 D-Dimer at 2.79  On review of systems, pt reports hemoptysis and denies unexpected weight loss, abdominal pain, SOB, chest pain, back pain, diarrhea, constipation and any other symptoms.   On PMHx the pt reports GERD, PTSD, HTN, PE, DVT. On Social Hx the pt reports that at his heaviest he smoked about 3/4 ppd but quit on 12/09/19. On Family Hx the pt reports that he has no family history of bleeding or blood clotting disorders.   Interval History   Casey Park is a wonderful 50 y.o.  male who is here today for evaluation and management of pulmonary embolism/deep vein thrombosis. The patient's last visit with [place] was on 01/16/2020. The pt reports that he is doing well overall.  The pt reports no new concerns or symptoms. He has since stopped smoking since his last visit, but notes that he occasionally smokes Marijuana once weekly. This occurs once to 3x weekly. The pt notes that he does not always smoke every week.  The pt notes that he has sciatic nerve pain, but is up-to-date with this matter. He is currently on [employer] therapy prior to the decision on surgery.  Lab results today 04/27/2020 of CBC w/diff and CMP is as follows: all values are WNL except for CMP in progress. 04/27/2020 D-dimer is  in progress.  On review of systems, pt denies SOB, chest pain, leg swelling, calf pain, leg pain, back pain, abdominal pain and any other symptoms.  MEDICAL HISTORY:  Past Medical History:  Diagnosis Date  . Anxiety   . Arnold-Chiari malformation, type I ([place])   . DVT (deep venous thrombosis) ([place])   . GERD (gastroesophageal reflux disease)   . GERD (gastroesophageal reflux disease)   . Hyperlipidemia   . Hypertension   . Lumbago   . Migraines    w/o [place]  . Nicotine use disorder   . OSA (obstructive sleep apnea)    on CPAP  . PTSD (post-traumatic stress disorder)   . Pulmonary embolism ([place])   . Sleep apnea    uses CPAP  . Trigeminal neuralgia    left side  . Tubular adenoma     SURGICAL HISTORY: Past Surgical History:  Procedure Laterality Date  . ANTERIOR CRUCIATE LIGAMENT REPAIR Right   . COLONOSCOPY  2019   Nilda Riggs  . COLONOSCOPY  2018   Nilda Riggs  . COLONOSCOPY W/ POLYPECTOMY     2017 and 2018  . ESOPHAGOGASTRODUODENOSCOPY  2017  . FOOT SURGERY Right   . TURBINOPLASTY    . UPPER GASTROINTESTINAL ENDOSCOPY      SOCIAL HISTORY: Social History   Socioeconomic History  . Marital status: Married    Spouse name: Not on file  . Number of children: 3  . Years of education: Not on file  . Highest education level: Not on file  Occupational History  . Occupation: retired from [employer]  2014   . Occupation: Sports coach  Tobacco Use  . Smoking status: Former Smoker    Packs/day: 1.00    Years: 25.00    Pack years: 25.00    Types: Cigarettes, Cigars    Quit date: 12/09/2019    Years since quitting: 0.5  . Smokeless tobacco: Never Used  Vaping Use  . Vaping Use: Never used  Substance and Sexual Activity  . Alcohol use: Yes    Comment: 2-4 wine weekly  . Drug use: Yes    Types: Marijuana    Comment: none since Saturday  . Sexual activity: Yes  Other Topics Concern  . 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 Other Mother        died young in [place]  . Stroke Father   . Throat cancer Father   . Lung cancer Father        mets  . Diabetes Maternal Grandfather   . Heart attack Paternal Uncle   . Stroke Paternal Uncle   . Heart attack Maternal Uncle   . Colon cancer Neg Hx   . Colon polyps Neg Hx   . Esophageal cancer Neg Hx   . Rectal cancer Neg Hx   . Stomach cancer Neg Hx     ALLERGIES:  has No Known Allergies.  MEDICATIONS:  Current Outpatient Medications  Medication Sig Dispense Refill  . albuterol (VENTOLIN HFA) 108 (90 Base) MCG/ACT inhaler Inhale 2 puffs into the lungs as needed for wheezing or shortness of breath.    [name] apixaban (ELIQUIS) 5 MG TABS tablet Take 1 tablet (5 mg total) by mouth 2 (two) times daily. 180 tablet 3  . ASPIRIN LOW DOSE 81 MG EC tablet Take 81 mg by mouth daily.    [name] buPROPion (WELLBUTRIN XL) 150 MG 24 hr tablet Take 150 mg by mouth every morning. (Patient not taking: No sig reported)    . gabapentin (NEURONTIN) 100 MG capsule Take 100 mg by mouth 3 (three) times daily as needed.    [name] lisinopril (ZESTRIL) 10 MG tablet Take 10 mg by mouth daily.    [name] omeprazole (PRILOSEC) 20 MG capsule Take 1 tablet by mouth daily. (Patient not taking: No sig reported)    . pantoprazole (PROTONIX) 40 MG tablet Take 40 mg by mouth daily.    [name]  PREVIDENT 0.2 % SOLN Take by mouth.    . rosuvastatin (CRESTOR) 10 MG tablet Take 10 mg by mouth daily.    . tadalafil (CIALIS) 10 MG tablet Take 1 tablet by mouth as needed.    . traMADol (ULTRAM) 50 MG tablet 1/2-1 tab every 6 hours as needed for pain x 5 days (Patient not taking: No sig reported) 20 tablet 0  . valACYclovir (VALTREX) 500 MG tablet Take 500 mg by mouth daily.     Current Facility-Administered Medications  Medication Dose Route Frequency Provider Last Rate Last Admin  . 0.9 %  sodium chloride infusion  500 mL Intravenous Once Gatha Mayer, MD        REVIEW OF SYSTEMS:    10 Point review of Systems was done is negative except as noted above.   PHYSICAL EXAMINATION: ECOG PERFORMANCE STATUS: 0 - Asymptomatic  . There were no vitals filed for this visit. There were no vitals filed for this visit. .There is no height or weight on file to calculate BMI.   GENERAL:alert, in no acute distress and comfortable SKIN: no acute rashes, no significant lesions EYES: conjunctiva are pink and non-injected, sclera anicteric OROPHARYNX: MMM, no exudates, no oropharyngeal erythema or ulceration NECK: supple, no JVD LYMPH:  no palpable lymphadenopathy in the cervical, axillary or inguinal regions LUNGS: clear to auscultation b/l with normal respiratory effort HEART: regular rate & rhythm ABDOMEN:  normoactive bowel sounds , non tender, not distended. Extremity: no pedal edema PSYCH: alert & oriented x 3 with fluent speech NEURO: no focal motor/sensory deficits    LABORATORY DATA:  I have reviewed the data as listed  . CBC Latest Ref Rng & Units 04/27/2020 01/01/2020 12/10/2019  WBC 4.0 - 10.5 K/uL 6.9 7.7 8.6  Hemoglobin 13.0 - 17.0 g/dL 14.5 13.7 13.4  Hematocrit 39.0 - 52.0 % 42.7 40.3 39.0  Platelets 150 - 400 K/uL 167 236 175    . CMP Latest Ref Rng & Units 04/27/2020 02/03/2020 01/01/2020  Glucose 70 - 99 mg/dL 99 107(H) 78  BUN 6 - 20 mg/dL 11 14 15   Creatinine  0.61 - 1.24 mg/dL 1.30(H) 1.16 1.16  Sodium 135 - 145 mmol/L 139 138 138  Potassium 3.5 - 5.1 mmol/L 4.6 4.5 4.1  Chloride 98 - 111 mmol/L 106 108 103  CO2 22 - 32 mmol/L 27 25 27   Calcium 8.9 - 10.3 mg/dL 9.4 9.0 9.4  Total Protein 6.5 - 8.1 g/dL 7.4 - 7.3  Total Bilirubin 0.3 - 1.2 mg/dL 0.6 - 0.4  Alkaline Phos 38 - 126 U/L 71 - 83  AST 15 - 41 U/L 22 - 22  ALT 0 - 44 U/L 20 - 21     RADIOGRAPHIC STUDIES: I have personally reviewed the radiological images as listed and agreed with the findings in the report. ECHOCARDIOGRAM COMPLETE  Result Date: 06/26/2020    ECHOCARDIOGRAM REPORT   Patient Name:   Casey Park Date of Exam: 06/26/2020 Medical Rec #:  [id number]      Height:       70.7 in Accession #:    [id number]     Weight:       270.0 lb Date of Birth:  01/05/1971      BSA:          2.390 m Patient Age:    49 years       BP:           117/75 mmHg Patient Gender:  M              HR:           60 bpm. Exam Location:  Outpatient Procedure: 2D Echo, Cardiac Doppler and Color Doppler Indications:    I27.20 Pulmonary Hypertension  History:        Patient has prior history of Echocardiogram examinations, most                 recent 12/10/2019. Risk Factors:Hypertension, Dyslipidemia, Sleep                 Apnea and GERD. Pulmonary Embolism.  Sonographer:    Jonelle Sidle Dance Referring Phys: [place]  1. Left ventricular ejection fraction, by estimation, is 55 to 60%. The left ventricle has normal function. The left ventricle has no regional wall motion abnormalities. Left ventricular diastolic parameters were normal.  2. Right ventricular systolic function is normal. The right ventricular size is normal. Tricuspid regurgitation signal is inadequate for assessing PA pressure.  3. The mitral valve is normal in structure. No evidence of mitral valve regurgitation. No evidence of mitral stenosis.  4. The aortic valve is normal in structure. Aortic valve regurgitation is not visualized.  No aortic stenosis is present.  5. The inferior vena cava is dilated in size with >50% respiratory variability, suggesting right atrial pressure of 8 mmHg. FINDINGS  Left Ventricle: Left ventricular ejection fraction, by estimation, is 55 to 60%. The left ventricle has normal function. The left ventricle has no regional wall motion abnormalities. The left ventricular internal cavity size was normal in size. There is  no left ventricular hypertrophy. Left ventricular diastolic parameters were normal. Normal left ventricular filling pressure. Right Ventricle: The right ventricular size is normal. No increase in right ventricular wall thickness. Right ventricular systolic function is normal. Tricuspid regurgitation signal is inadequate for assessing PA pressure. Left Atrium: Left atrial size was normal in size. Right Atrium: Right atrial size was normal in size. Pericardium: There is no evidence of pericardial effusion. Mitral Valve: The mitral valve is normal in structure. No evidence of mitral valve regurgitation. No evidence of mitral valve stenosis. Tricuspid Valve: The tricuspid valve is normal in structure. Tricuspid valve regurgitation is not demonstrated. No evidence of tricuspid stenosis. Aortic Valve: The aortic valve is normal in structure. Aortic valve regurgitation is not visualized. No aortic stenosis is present. Pulmonic Valve: The pulmonic valve was normal in structure. Pulmonic valve regurgitation is not visualized. No evidence of pulmonic stenosis. Aorta: The aortic root is normal in size and structure. Venous: The inferior vena cava is dilated in size with greater than 50% respiratory variability, suggesting right atrial pressure of 8 mmHg. IAS/Shunts: No atrial level shunt detected by color flow Doppler.  LEFT VENTRICLE PLAX 2D LVIDd:         4.70 cm  Diastology LVIDs:         3.80 cm  LV e' medial:    7.70 cm/s LV PW:         1.10 cm  LV E/e' medial:  7.9 LV IVS:        1.20 cm  LV e' lateral:    11.40 cm/s LVOT diam:     2.30 cm  LV E/e' lateral: 5.3 LV SV:         103 LV SV Index:   43 LVOT Area:     4.15 cm  RIGHT VENTRICLE  IVC RV Basal diam:  3.30 cm     IVC diam: 2.10 cm RV Mid diam:    1.90 cm RV S prime:     10.40 cm/s TAPSE (M-mode): 2.5 cm LEFT ATRIUM             Index       RIGHT ATRIUM           Index LA diam:        4.70 cm 1.97 cm/m  RA Area:     16.20 cm LA Vol (A2C):   74.6 ml 31.22 ml/m RA Volume:   36.90 ml  15.44 ml/m LA Vol (A4C):   47.8 ml 20.00 ml/m LA Biplane Vol: 60.2 ml 25.19 ml/m  AORTIC VALVE LVOT Vmax:   131.00 cm/s LVOT Vmean:  78.200 cm/s LVOT VTI:    0.247 m  AORTA Ao Root diam: 3.50 cm Ao Asc diam:  3.50 cm MITRAL VALVE MV Area (PHT): 3.37 cm    SHUNTS MV Decel Time: 225 msec    Systemic VTI:  0.25 m MV E velocity: 60.70 cm/s  Systemic Diam: 2.30 cm MV A velocity: 55.80 cm/s MV E/A ratio:  1.09 Fransico Him MD Electronically signed by Fransico Him MD Signature Date/Time: 06/26/2020/1:16:52 PM    Final     ASSESSMENT & PLAN:   50 yo with   1) Subsegmental Pulmonary embolism 2) RLE venous DVT  PLAN: -Discussed patient's most recent labs from 04/27/2020; blood counts completely normal, chemistries and D-dimer in progress. -Advised pt he is near the 6 month mark for anticoagulation. -Advised pt that we will continue 5mg  Eliquis BID until reach 6 month mark (February) and then transition to preventative 2.5mg  Eliquis BID for one year following.  d-dimer - wnl today. -Recommend pt stay physically active as much as possible given sciatic nerve pain and heel spur. -Recommend pt remain up-to-date with age-appropriate cancer screenings with PCP. -Continue smoking cessation. -Continue 5 mg Eliquis BID. -Will see back with PCP unless major changes.   FOLLOW UP: ***  . No orders of the defined types were placed in this encounter.   All of the patients questions were answered with apparent satisfaction. The patient knows to call the clinic with  any problems, questions or concerns.  The total time spent in the appointment was *** minutes and more than 50% was on counseling and direct patient cares.    Sullivan Lone MD [place] AAHIVMS [facility] [place] [facility] Hematology/Oncology Physician [facility]  (Office):       [phone] (Work cell):  [phone] (Fax):           [phone]  07/13/2020 2:14 PM  I, Reinaldo Raddle, am acting as scribe for Dr. Sullivan Lone, MD.

## 2020-07-14 ENCOUNTER — Inpatient Hospital Stay: Admitting: Hematology

## 2020-07-14 ENCOUNTER — Inpatient Hospital Stay

## 2020-07-16 ENCOUNTER — Other Ambulatory Visit: Payer: Self-pay

## 2020-07-16 ENCOUNTER — Ambulatory Visit: Admitting: Podiatry

## 2020-07-16 DIAGNOSIS — M722 Plantar fascial fibromatosis: Secondary | ICD-10-CM

## 2020-07-16 DIAGNOSIS — M216X1 Other acquired deformities of right foot: Secondary | ICD-10-CM | POA: Diagnosis not present

## 2020-07-16 DIAGNOSIS — M21861 Other specified acquired deformities of right lower leg: Secondary | ICD-10-CM

## 2020-07-16 DIAGNOSIS — M21862 Other specified acquired deformities of left lower leg: Secondary | ICD-10-CM

## 2020-07-16 DIAGNOSIS — M216X2 Other acquired deformities of left foot: Secondary | ICD-10-CM

## 2020-07-16 MED ORDER — METHYLPREDNISOLONE 4 MG PO TBPK: ORAL_TABLET | ORAL | 0 refills | Status: DC

## 2020-07-18 ENCOUNTER — Encounter: Payer: Self-pay | Admitting: Internal Medicine

## 2020-07-21 ENCOUNTER — Encounter: Payer: Self-pay | Admitting: Podiatry

## 2020-07-21 NOTE — Progress Notes (Signed)
Subjective:  Patient ID: Casey Park, male    DOB: 1971/02/01,  MRN: [id number]  Chief Complaint  Patient presents with  . Foot Pain    Would like an injection     50 y.o. male presents with the above complaint.  Patient presents with complaint of continuous pain from bilateral plantar fasciitis.  Patient states that left is slightly worse than right side.  He has surgery discussions as scheduled with Dr. Sherryle Lis.  EGS was not able to see him today and would like to get into be seen for steroid injection in the heel to help with some of the pain that he is experiencing.  He has not been taking a Medrol Dosepak as well.   Review of Systems: Negative except as noted in the HPI. Denies N/V/F/Ch.  Past Medical History:  Diagnosis Date  . Anxiety   . Arnold-Chiari malformation, type I ([place])   . DVT (deep venous thrombosis) ([place])   . GERD (gastroesophageal reflux disease)   . GERD (gastroesophageal reflux disease)   . Hx of colonic polyps-sessile serrated polyps and adenomas 05/01/2020   2017-1 cm SSP, 7 other diminutive polyps 2 SSP 1 tubular adenoma-[facility] 2018-12 mm SSP, 2 diminutive SSP, 1 rectal hyperplastic-[facility] 07/2020 4 distal hyperplastic recall 2027  . Hyperlipidemia   . Hypertension   . Lumbago   . Migraines    w/o [place]  . Nicotine use disorder   . OSA (obstructive sleep apnea)    on CPAP  . PTSD (post-traumatic stress disorder)   . Pulmonary embolism ([place])   . Sleep apnea    uses CPAP  . Trigeminal neuralgia    left side  . Tubular adenoma     Current Outpatient Medications:  .  methylPREDNISolone (MEDROL DOSEPAK) 4 MG TBPK tablet, Take as directed, Disp: 21 each, Rfl: 0 .  albuterol (VENTOLIN HFA) 108 (90 Base) MCG/ACT inhaler, Inhale 2 puffs into the lungs as needed for wheezing or shortness of breath., Disp: , Rfl:  .  apixaban (ELIQUIS) 5 MG TABS tablet, Take 1 tablet (5 mg total) by mouth 2 (two) times daily., Disp: 180 tablet, Rfl: 3 .  ASPIRIN  LOW DOSE 81 MG EC tablet, Take 81 mg by mouth daily., Disp: , Rfl:  .  buPROPion (WELLBUTRIN XL) 150 MG 24 hr tablet, Take 150 mg by mouth every morning. (Patient not taking: No sig reported), Disp: , Rfl:  .  gabapentin (NEURONTIN) 100 MG capsule, Take 100 mg by mouth 3 (three) times daily as needed., Disp: , Rfl:  .  lisinopril (ZESTRIL) 10 MG tablet, Take 10 mg by mouth daily., Disp: , Rfl:  .  omeprazole (PRILOSEC) 20 MG capsule, Take 1 tablet by mouth daily. (Patient not taking: No sig reported), Disp: , Rfl:  .  pantoprazole (PROTONIX) 40 MG tablet, Take 40 mg by mouth daily., Disp: , Rfl:  .  PREVIDENT 0.2 % SOLN, Take by mouth., Disp: , Rfl:  .  rosuvastatin (CRESTOR) 10 MG tablet, Take 10 mg by mouth daily., Disp: , Rfl:  .  tadalafil (CIALIS) 10 MG tablet, Take 1 tablet by mouth as needed., Disp: , Rfl:  .  traMADol (ULTRAM) 50 MG tablet, 1/2-1 tab every 6 hours as needed for pain x 5 days (Patient not taking: No sig reported), Disp: 20 tablet, Rfl: 0 .  valACYclovir (VALTREX) 500 MG tablet, Take 500 mg by mouth daily., Disp: , Rfl:   Current Facility-Administered Medications:  .  0.9 %  sodium chloride infusion, 500 mL, Intravenous, Once, Gatha Mayer, MD  Social History   Tobacco Use  Smoking Status Former Smoker  . Packs/day: 1.00  . Years: 25.00  . Pack years: 25.00  . Types: Cigarettes, Cigars  . Quit date: 12/09/2019  . Years since quitting: 0.6  Smokeless Tobacco Never Used    No Known Allergies Objective:  There were no vitals filed for this visit. There is no height or weight on file to calculate BMI. Constitutional Well developed. Well nourished.  Vascular Dorsalis pedis pulses palpable bilaterally. Posterior tibial pulses palpable bilaterally. Capillary refill normal to all digits.  No cyanosis or clubbing noted. Pedal hair growth normal.  Neurologic Normal speech. Oriented to person, place, and time. Epicritic sensation to light touch grossly present  bilaterally.  Dermatologic Nails well groomed and normal in appearance. No open wounds. No skin lesions.  Orthopedic:  Pain on palpation to the calcaneal tuber.  Pain with dorsiflexion of all the digits.  Tonic plantar fascial noted.  Positive Silfverskiold test noted bilaterally with gastrocnemius equinus.   Radiographs: None Assessment:   1. Gastrocnemius equinus of left lower extremity   2. Gastrocnemius equinus of right lower extremity   3. Plantar fasciitis of right foot   4. Plantar fasciitis of left foot    Plan:  Patient was evaluated and treated and all questions answered.  Plantar Fasciitis, bilaterallyWith underlying gastrocnemius equinus - XR reviewed as above.  - Educated on icing and stretching. Instructions given.  - Injection delivered to the plantar fascia as below. - DME: Continue wearing plantar Fascial Brace - Pharmacologic management: Medrol Dosepak  Procedure: Injection Tendon/Ligament Location: Bilateral plantar fascia at the glabrous junction; medial approach. Skin Prep: alcohol Injectate: 0.5 cc 0.5% marcaine plain, 0.5 cc of 1% Lidocaine, 0.5 cc kenalog 10. Disposition: Patient tolerated procedure well. Injection site dressed with a band-aid.  No follow-ups on file.  No follow-ups on file.

## 2020-08-04 ENCOUNTER — Encounter: Admitting: Podiatry

## 2020-08-04 ENCOUNTER — Other Ambulatory Visit: Payer: Self-pay

## 2020-08-10 ENCOUNTER — Other Ambulatory Visit: Payer: Self-pay

## 2020-08-10 ENCOUNTER — Encounter: Payer: Self-pay | Admitting: Podiatry

## 2020-08-10 ENCOUNTER — Ambulatory Visit: Admitting: Podiatry

## 2020-08-10 DIAGNOSIS — M722 Plantar fascial fibromatosis: Secondary | ICD-10-CM | POA: Diagnosis not present

## 2020-08-10 DIAGNOSIS — M5432 Sciatica, left side: Secondary | ICD-10-CM | POA: Insufficient documentation

## 2020-08-10 DIAGNOSIS — M25569 Pain in unspecified knee: Secondary | ICD-10-CM | POA: Insufficient documentation

## 2020-08-10 DIAGNOSIS — E669 Obesity, unspecified: Secondary | ICD-10-CM | POA: Insufficient documentation

## 2020-08-10 DIAGNOSIS — M21861 Other specified acquired deformities of right lower leg: Secondary | ICD-10-CM

## 2020-08-10 DIAGNOSIS — F4329 Adjustment disorder with other symptoms: Secondary | ICD-10-CM | POA: Insufficient documentation

## 2020-08-10 DIAGNOSIS — IMO0002 Reserved for concepts with insufficient information to code with codable children: Secondary | ICD-10-CM | POA: Insufficient documentation

## 2020-08-10 DIAGNOSIS — R809 Proteinuria, unspecified: Secondary | ICD-10-CM | POA: Insufficient documentation

## 2020-08-10 DIAGNOSIS — D490 Neoplasm of unspecified behavior of digestive system: Secondary | ICD-10-CM | POA: Insufficient documentation

## 2020-08-10 DIAGNOSIS — G43909 Migraine, unspecified, not intractable, without status migrainosus: Secondary | ICD-10-CM | POA: Insufficient documentation

## 2020-08-10 DIAGNOSIS — A6000 Herpesviral infection of urogenital system, unspecified: Secondary | ICD-10-CM | POA: Insufficient documentation

## 2020-08-10 DIAGNOSIS — H52223 Regular astigmatism, bilateral: Secondary | ICD-10-CM | POA: Insufficient documentation

## 2020-08-10 DIAGNOSIS — Z09 Encounter for follow-up examination after completed treatment for conditions other than malignant neoplasm: Secondary | ICD-10-CM | POA: Insufficient documentation

## 2020-08-10 DIAGNOSIS — M25511 Pain in right shoulder: Secondary | ICD-10-CM | POA: Insufficient documentation

## 2020-08-10 DIAGNOSIS — J45909 Unspecified asthma, uncomplicated: Secondary | ICD-10-CM | POA: Insufficient documentation

## 2020-08-10 DIAGNOSIS — M216X1 Other acquired deformities of right foot: Secondary | ICD-10-CM

## 2020-08-10 DIAGNOSIS — H524 Presbyopia: Secondary | ICD-10-CM | POA: Insufficient documentation

## 2020-08-10 DIAGNOSIS — H04123 Dry eye syndrome of bilateral lacrimal glands: Secondary | ICD-10-CM | POA: Insufficient documentation

## 2020-08-10 DIAGNOSIS — H7290 Unspecified perforation of tympanic membrane, unspecified ear: Secondary | ICD-10-CM | POA: Insufficient documentation

## 2020-08-10 DIAGNOSIS — F17200 Nicotine dependence, unspecified, uncomplicated: Secondary | ICD-10-CM | POA: Insufficient documentation

## 2020-08-10 DIAGNOSIS — M21862 Other specified acquired deformities of left lower leg: Secondary | ICD-10-CM

## 2020-08-10 DIAGNOSIS — G4733 Obstructive sleep apnea (adult) (pediatric): Secondary | ICD-10-CM | POA: Insufficient documentation

## 2020-08-10 DIAGNOSIS — H5710 Ocular pain, unspecified eye: Secondary | ICD-10-CM | POA: Insufficient documentation

## 2020-08-10 DIAGNOSIS — M2350 Chronic instability of knee, unspecified knee: Secondary | ICD-10-CM | POA: Insufficient documentation

## 2020-08-10 DIAGNOSIS — H6980 Other specified disorders of Eustachian tube, unspecified ear: Secondary | ICD-10-CM | POA: Insufficient documentation

## 2020-08-10 DIAGNOSIS — M2040 Other hammer toe(s) (acquired), unspecified foot: Secondary | ICD-10-CM | POA: Insufficient documentation

## 2020-08-10 DIAGNOSIS — G4700 Insomnia, unspecified: Secondary | ICD-10-CM | POA: Insufficient documentation

## 2020-08-10 DIAGNOSIS — K0889 Other specified disorders of teeth and supporting structures: Secondary | ICD-10-CM | POA: Insufficient documentation

## 2020-08-10 DIAGNOSIS — Z86711 Personal history of pulmonary embolism: Secondary | ICD-10-CM | POA: Insufficient documentation

## 2020-08-10 DIAGNOSIS — E663 Overweight: Secondary | ICD-10-CM | POA: Insufficient documentation

## 2020-08-10 DIAGNOSIS — M216X2 Other acquired deformities of left foot: Secondary | ICD-10-CM

## 2020-08-10 DIAGNOSIS — N529 Male erectile dysfunction, unspecified: Secondary | ICD-10-CM | POA: Insufficient documentation

## 2020-08-10 DIAGNOSIS — K219 Gastro-esophageal reflux disease without esophagitis: Secondary | ICD-10-CM | POA: Insufficient documentation

## 2020-08-10 DIAGNOSIS — R519 Headache, unspecified: Secondary | ICD-10-CM | POA: Insufficient documentation

## 2020-08-10 DIAGNOSIS — M779 Enthesopathy, unspecified: Secondary | ICD-10-CM | POA: Insufficient documentation

## 2020-08-10 DIAGNOSIS — M5126 Other intervertebral disc displacement, lumbar region: Secondary | ICD-10-CM | POA: Insufficient documentation

## 2020-08-10 NOTE — Progress Notes (Signed)
  Subjective:  Patient ID: Casey Park, male    DOB: 09-30-1970,  MRN: [id number]  Chief Complaint  Patient presents with  . Follow-up    Reports has purchased orthotics at another location and they are helping especially with right foot. However left foot is still in pain.    50 y.o. male returns with the above complaint. History confirmed with patient.  Had an injection in April with Dr. Posey Pronto which helped.  This in addition to orthotics that he got at the shoe market have limited most of the pain in the right side, the left side is still painful but is more towards the outside of the heel now  Objective:  Physical Exam: warm, good capillary refill, no trophic changes or ulcerative lesions, normal DP and PT pulses and normal sensory exam. Bilaterally he has gastrocnemius equinus  Left Foot: Pain in the lateral plantar heel now Right Foot: No pain today  Radiographs: X-ray of both feet: no fracture, dislocation, swelling or degenerative changes noted and plantar calcaneal spur bilaterally  IMPRESSION: Right heel:  1. Findings suggestive of mild plantar fasciitis. 2. Tendinosis with longitudinal split tear of the peroneus brevis tendon. 3. Focal full-thickness cartilage defect at the medial talar shoulder measuring at least 8 mm in transverse dimension with underlying subchondral cystic changes. 4. Thickened appearance of the superomedial calcaneonavicular ligament, which may be degenerative or related to remote injury.  IMPRESSION: Left heel:  1. Findings suggestive of mild plantar fasciitis. 2. Tiny interstitial tear within the retro-malleolar aspect of the peroneus longus tendon. Tendinosis of the infra-malleolar peroneus brevis tendon without definite split tear. 3. Mild focal subchondral marrow edema at the midportion of the medial talar shoulder without definite overlying cartilage defect.  Assessment:   1. Plantar fasciitis of left foot   2. Plantar fasciitis  of right foot   3. Gastrocnemius equinus of left lower extremity   4. Gastrocnemius equinus of right lower extremity      Plan:  Patient was evaluated and treated and all questions answered.  We again reviewed his progress and today recommended a further injection of the left plantar heel today.  Recommend we do this and see how this does if it eliminates his pain then we can avoid surgery.  If still painful then we will plan for surgery on June 17.  Booking sheet given to the surgery scheduler, he will return the week for surgery to sign consent  After sterile prep with povidone-iodine solution and alcohol, the left heel lateral was injected with 0.5cc 2% xylocaine plain, 0.5cc 0.5% marcaine plain, 5mg  triamcinolone acetonide, and 2mg  dexamethasone was injected along  the plantar fascia at the insertion on the plantar calcaneus from a lateral approach.  The patient tolerated the procedure well without complication.    Surgical plan:  Procedure: -Left lower extremity endoscopic plantar fascial release and endoscopic Strayer gastrocnemius recession  Location: -[facility]  Anesthesia plan: -IV sedation with regional block  Postoperative pain plan: - Tylenol 1000 mg every 6 hours, gabapentin 300 mg every 8 hours x5 days, oxycodone 5 mg 1-2 tabs every 6 hours only as needed,   DVT prophylaxis: -will defer anticoagulation bridging to his hematologist, he will resume his Eliquis after surgery  WB Restrictions / DME needs: -WBAT in a CAM boot.     Return in about 4 weeks (around 09/07/2020) for recheck plantar fasciitis, sign surgery consent.

## 2020-09-08 ENCOUNTER — Ambulatory Visit: Admitting: Podiatry

## 2020-09-10 ENCOUNTER — Other Ambulatory Visit: Payer: Self-pay

## 2020-09-10 ENCOUNTER — Ambulatory Visit: Admitting: Podiatry

## 2020-09-10 DIAGNOSIS — M21862 Other specified acquired deformities of left lower leg: Secondary | ICD-10-CM

## 2020-09-10 DIAGNOSIS — M722 Plantar fascial fibromatosis: Secondary | ICD-10-CM

## 2020-09-10 DIAGNOSIS — M216X2 Other acquired deformities of left foot: Secondary | ICD-10-CM

## 2020-09-10 DIAGNOSIS — R2689 Other abnormalities of gait and mobility: Secondary | ICD-10-CM | POA: Diagnosis not present

## 2020-09-10 NOTE — Progress Notes (Signed)
  Subjective:  Patient ID: STEPHANOS FAN, male    DOB: 10/05/1970,  MRN: [id number]  Chief Complaint  Patient presents with   Plantar Fasciitis    Surgery consult left    50 y.o. male returns with the above complaint. History confirmed with patient.  Surgery is scheduled for next Friday  Objective:  Physical Exam: warm, good capillary refill, no trophic changes or ulcerative lesions, normal DP and PT pulses and normal sensory exam. Bilaterally he has gastrocnemius equinus  Left Foot: Pain in the lateral plantar heel  Right Foot: No pain today  Radiographs: X-ray of both feet: no fracture, dislocation, swelling or degenerative changes noted and plantar calcaneal spur bilaterally  IMPRESSION: Right heel:   1. Findings suggestive of mild plantar fasciitis. 2. Tendinosis with longitudinal split tear of the peroneus brevis tendon. 3. Focal full-thickness cartilage defect at the medial talar shoulder measuring at least 8 mm in transverse dimension with underlying subchondral cystic changes. 4. Thickened appearance of the superomedial calcaneonavicular ligament, which may be degenerative or related to remote injury.  IMPRESSION: Left heel:   1. Findings suggestive of mild plantar fasciitis. 2. Tiny interstitial tear within the retro-malleolar aspect of the peroneus longus tendon. Tendinosis of the infra-malleolar peroneus brevis tendon without definite split tear. 3. Mild focal subchondral marrow edema at the midportion of the medial talar shoulder without definite overlying cartilage defect.   Assessment:   1. Inability to bear weight   2. Plantar fasciitis of left foot   3. Gastrocnemius equinus of left lower extremity      Plan:  Patient was evaluated and treated and all questions answered.  Is still having pain.  We discussed proceeding with the surgical plan of EPF and gastrocnemius recession.  Discussed the risk, benefits and potential complications including but  not limited to pain, swelling, infection, scar, numbness which may be temporary or permanent, chronic pain, stiffness, nerve pain or damage, wound healing problems.  All questions were addressed.  Informed consent was signed.  Surgical plan:  Procedure: -Left lower extremity endoscopic plantar fascial release and endoscopic Strayer gastrocnemius recession  Location: -[facility]  Anesthesia plan: -IV sedation with regional block  Postoperative pain plan: - Tylenol 1000 mg every 6 hours, gabapentin 300 mg every 8 hours x5 days, oxycodone 5 mg 1-2 tabs every 6 hours only as needed,   DVT prophylaxis: -will stop Eliquis 48 hours prior to surgery, resume day after surgery  WB Restrictions / DME needs: -WBAT in a CAM boot.  We will dispense at surgical center.  Rx for knee scooter dispensed     No follow-ups on file.

## 2020-09-13 ENCOUNTER — Encounter: Payer: Self-pay | Admitting: Podiatry

## 2020-09-18 ENCOUNTER — Other Ambulatory Visit: Payer: Self-pay | Admitting: Podiatry

## 2020-09-18 DIAGNOSIS — M722 Plantar fascial fibromatosis: Secondary | ICD-10-CM | POA: Diagnosis not present

## 2020-09-18 MED ORDER — OXYCODONE-ACETAMINOPHEN 5-325 MG PO TABS: 1.0000 | ORAL_TABLET | Freq: Four times a day (QID) | ORAL | 0 refills | Status: AC | PRN

## 2020-09-18 NOTE — Progress Notes (Signed)
6/17 [facility] EPF, PRP

## 2020-09-21 ENCOUNTER — Telehealth: Payer: Self-pay | Admitting: *Deleted

## 2020-09-21 NOTE — Telephone Encounter (Signed)
Thank you :)

## 2020-09-21 NOTE — Telephone Encounter (Signed)
Patient is calling because his bandages around the the achilles tendon seem a little too tight,uncomfortable for him. I explained to patient that he may loosen the ace wrapping a little around the area but do not remove any dressings,keep the foot elevated /icing as instructed and no pressure on the foot.he verbalized understanding.

## 2020-09-24 ENCOUNTER — Ambulatory Visit (INDEPENDENT_AMBULATORY_CARE_PROVIDER_SITE_OTHER): Admitting: Podiatry

## 2020-09-24 ENCOUNTER — Other Ambulatory Visit: Payer: Self-pay

## 2020-09-24 DIAGNOSIS — M722 Plantar fascial fibromatosis: Secondary | ICD-10-CM

## 2020-09-28 ENCOUNTER — Encounter: Payer: Self-pay | Admitting: Podiatry

## 2020-09-28 NOTE — Progress Notes (Signed)
  Subjective:  Patient ID: Casey Park, male    DOB: [birth year],  MRN: [id number]  Chief Complaint  Patient presents with   Routine Post Op     POV #1 DOS 09/18/2020 EPF LT     DOS: 09/18/2020 Procedure: EPF left foot with PRP injection  50 y.o. male returns for post-op check.  Feeling well was pretty sore for a few days but getting better  Review of Systems: Negative except as noted in the HPI. Denies N/V/F/Ch.   Objective:  There were no vitals filed for this visit. There is no height or weight on file to calculate BMI. Constitutional Well developed. Well nourished.  Vascular Foot warm and well perfused. Capillary refill normal to all digits.   Neurologic Normal speech. Oriented to person, place, and time. Epicritic sensation to light touch grossly present bilaterally.  Dermatologic Skin healing well without signs of infection. Skin edges well coapted without signs of infection.  Orthopedic: Tenderness to palpation noted about the surgical site.    Assessment:   1. Plantar fasciitis of left foot    Plan:  Patient was evaluated and treated and all questions answered.  S/p foot surgery left -Progressing as expected post-operatively. -WB Status: WBAT in CAM boot -Sutures: Removed next visit.  May begin bathing now. -Medications: No refills required today -Foot redressed with a Band-Aid and Ace wrap.  No follow-ups on file.

## 2020-10-08 ENCOUNTER — Ambulatory Visit (INDEPENDENT_AMBULATORY_CARE_PROVIDER_SITE_OTHER): Admitting: Podiatry

## 2020-10-08 ENCOUNTER — Other Ambulatory Visit: Payer: Self-pay

## 2020-10-08 DIAGNOSIS — M722 Plantar fascial fibromatosis: Secondary | ICD-10-CM

## 2020-10-08 NOTE — Patient Instructions (Signed)

## 2020-10-11 NOTE — Progress Notes (Signed)
  Subjective:  Patient ID: Casey Park, male    DOB: 10/20/1970,  MRN: [id number]  Chief Complaint  Patient presents with   Routine Post Op     POV #1 DOS 09/18/2020 EPF LT     DOS: 09/18/2020 Procedure: EPF left foot with PRP injection  50 y.o. male returns for post-op check.  Feeling well was pretty sore for a few days but getting better  Review of Systems: Negative except as noted in the HPI. Denies N/V/F/Ch.   Objective:  There were no vitals filed for this visit. There is no height or weight on file to calculate BMI. Constitutional Well developed. Well nourished.  Vascular Foot warm and well perfused. Capillary refill normal to all digits.   Neurologic Normal speech. Oriented to person, place, and time. Epicritic sensation to light touch grossly present bilaterally.  Dermatologic Skin healing well without signs of infection. Skin edges well coapted without signs of infection.  Orthopedic: Tenderness to palpation noted about the surgical site.    Assessment:   1. Plantar fasciitis of left foot     Plan:  Patient was evaluated and treated and all questions answered.  S/p foot surgery left -Sutures removed he will begin to resume regular shoe gear and begin light exercise nonimpact.  Can also begin therapy exercises again  Return in about 3 weeks (around 10/29/2020) for post op (no x-rays).

## 2020-10-29 ENCOUNTER — Ambulatory Visit (INDEPENDENT_AMBULATORY_CARE_PROVIDER_SITE_OTHER): Admitting: Podiatry

## 2020-10-29 ENCOUNTER — Other Ambulatory Visit: Payer: Self-pay

## 2020-10-29 DIAGNOSIS — M722 Plantar fascial fibromatosis: Secondary | ICD-10-CM

## 2020-10-29 DIAGNOSIS — Z9889 Other specified postprocedural states: Secondary | ICD-10-CM

## 2020-10-29 MED ORDER — METHYLPREDNISOLONE 4 MG PO TBPK: ORAL_TABLET | ORAL | 0 refills | Status: DC

## 2020-10-29 NOTE — Patient Instructions (Signed)

## 2020-11-01 ENCOUNTER — Encounter: Payer: Self-pay | Admitting: Podiatry

## 2020-11-01 NOTE — Progress Notes (Signed)
  Subjective:  Patient ID: Casey Park, male    DOB: 1970/12/30,  MRN: [id number]  Chief Complaint  Patient presents with   Routine Post Op     POV #1 DOS 09/18/2020 EPF LT     DOS: 09/18/2020 Procedure: EPF left foot with PRP injection  50 y.o. male returns for post-op check.  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 Denies N/V/F/Ch.   Objective:  There were no vitals filed for this visit. There is no height or weight on file to calculate BMI. Constitutional Well developed. Well nourished.  Vascular Foot warm and well perfused. Capillary refill normal to all digits.   Neurologic Normal speech. Oriented to person, place, and time. Epicritic sensation to light touch grossly present bilaterally.  Dermatologic Incision well-healed  Orthopedic: Tenderness to palpation noted about the surgical site.    Assessment:   1. Plantar fasciitis of left foot   2. Post-operative state     Plan:  Patient was evaluated and treated and all questions answered.  S/p foot surgery left -Reviewed he likely is doing too much activity at this point and he is inflamed.  He discussed that has not been doing exercises much as he should as well as the night splint which I again emphasized importance of both of these.  Recommended use CAM boot if he got be on his feet for more than an hour such as at his son's football practice.  I prescribed him a methylprednisolone taper for anti-inflammatory effect now we are 6 weeks out from the injection  Return in about 4 weeks (around 11/26/2020).

## 2020-12-03 ENCOUNTER — Other Ambulatory Visit: Payer: Self-pay

## 2020-12-03 ENCOUNTER — Telehealth: Payer: Self-pay

## 2020-12-03 ENCOUNTER — Ambulatory Visit (INDEPENDENT_AMBULATORY_CARE_PROVIDER_SITE_OTHER): Admitting: Podiatry

## 2020-12-03 DIAGNOSIS — M21861 Other specified acquired deformities of right lower leg: Secondary | ICD-10-CM

## 2020-12-03 DIAGNOSIS — M722 Plantar fascial fibromatosis: Secondary | ICD-10-CM

## 2020-12-03 DIAGNOSIS — M21862 Other specified acquired deformities of left lower leg: Secondary | ICD-10-CM

## 2020-12-03 DIAGNOSIS — M216X1 Other acquired deformities of right foot: Secondary | ICD-10-CM

## 2020-12-03 DIAGNOSIS — M216X2 Other acquired deformities of left foot: Secondary | ICD-10-CM

## 2020-12-03 MED ORDER — METHYLPREDNISOLONE 4 MG PO TBPK: ORAL_TABLET | ORAL | 0 refills | Status: AC

## 2020-12-03 NOTE — Telephone Encounter (Signed)
Orders and demographics faxed to [facility] \ Evaluate and treat  M21.6 x 1, gastroc equinus bilateral M72.2 plantar fasciitis bilateral Status post Left plantar fascia surgery on 09/18/20 - still very painful  Twice a week for 8 weeks Active and passive ROM Dry needling

## 2020-12-03 NOTE — Patient Instructions (Signed)
 EXERCISES- RANGE OF MOTION (ROM) AND STRETCHING EXERCISES - Plantar Fasciitis (Heel Spur Syndrome) These exercises may help you when beginning to rehabilitate your injury. Your symptoms may resolve with or without further involvement from your physician, physical therapist or athletic trainer. While completing these exercises, remember:  Restoring tissue flexibility helps normal motion to return to the joints. This allows healthier, less painful movement and activity. An effective stretch should be held for at least 30 seconds. A stretch should never be painful. You should only feel a gentle lengthening or release in the stretched tissue.  RANGE OF MOTION - Toe Extension, Flexion Sit with your right / left leg crossed over your opposite knee. Grasp your toes and gently pull them back toward the top of your foot. You should feel a stretch on the bottom of your toes and/or foot. Hold this stretch for 10 seconds. Now, gently pull your toes toward the bottom of your foot. You should feel a stretch on the top of your toes and or foot. Hold this stretch for 10 seconds. Repeat  times. Complete this stretch 3 times per day.   RANGE OF MOTION - Ankle Dorsiflexion, Active Assisted Remove shoes and sit on a chair that is preferably not on a carpeted surface. Place right / left foot under knee. Extend your opposite leg for support. Keeping your heel down, slide your right / left foot back toward the chair until you feel a stretch at your ankle or calf. If you do not feel a stretch, slide your bottom forward to the edge of the chair, while still keeping your heel down. Hold this stretch for 10 seconds. Repeat 3 times. Complete this stretch 2 times per day.   STRETCH  Gastroc, Standing Place hands on wall. Extend right / left leg, keeping the front knee somewhat bent. Slightly point your toes inward on your back foot. Keeping your right / left heel on the floor and your knee straight, shift your  weight toward the wall, not allowing your back to arch. You should feel a gentle stretch in the right / left calf. Hold this position for 10 seconds. Repeat 3 times. Complete this stretch 2 times per day.  STRETCH  Soleus, Standing Place hands on wall. Extend right / left leg, keeping the other knee somewhat bent. Slightly point your toes inward on your back foot. Keep your right / left heel on the floor, bend your back knee, and slightly shift your weight over the back leg so that you feel a gentle stretch deep in your back calf. Hold this position for 10 seconds. Repeat 3 times. Complete this stretch 2 times per day.  STRETCH  Gastrocsoleus, Standing  Note: This exercise can place a lot of stress on your foot and ankle. Please complete this exercise only if specifically instructed by your caregiver.  Place the ball of your right / left foot on a step, keeping your other foot firmly on the same step. Hold on to the wall or a rail for balance. Slowly lift your other foot, allowing your body weight to press your heel down over the edge of the step. You should feel a stretch in your right / left calf. Hold this position for 10 seconds. Repeat this exercise with a slight bend in your right / left knee. Repeat 3 times. Complete this stretch 2 times per day.   STRENGTHENING EXERCISES - Plantar Fasciitis (Heel Spur Syndrome)  These exercises may help you when beginning to rehabilitate your   injury. They may resolve your symptoms with or without further involvement from your physician, physical therapist or athletic trainer. While completing these exercises, remember:  Muscles can gain both the endurance and the strength needed for everyday activities through controlled exercises. Complete these exercises as instructed by your physician, physical therapist or athletic trainer. Progress the resistance and repetitions only as guided.  STRENGTH - Towel Curls Sit in a chair positioned on a  non-carpeted surface. Place your foot on a towel, keeping your heel on the floor. Pull the towel toward your heel by only curling your toes. Keep your heel on the floor. Repeat 3 times. Complete this exercise 2 times per day.  STRENGTH - Ankle Inversion Secure one end of a rubber exercise band/tubing to a fixed object (table, pole). Loop the other end around your foot just before your toes. Place your fists between your knees. This will focus your strengthening at your ankle. Slowly, pull your big toe up and in, making sure the band/tubing is positioned to resist the entire motion. Hold this position for 10 seconds. Have your muscles resist the band/tubing as it slowly pulls your foot back to the starting position. Repeat 3 times. Complete this exercises 2 times per day.  Document Released: 03/21/2005 Document Revised: 06/13/2011 Document Reviewed: 07/03/2008 [employer] Patient Information 2014 [employer].  

## 2020-12-08 NOTE — Progress Notes (Signed)
  Subjective:  Patient ID: Casey Park, male    DOB: 07-Jul-1970,  MRN: [id number]  Chief Complaint  Patient presents with   Routine Post Op     POV #1 DOS 09/18/2020 EPF LT     DOS: 09/18/2020 Procedure: EPF left foot with PRP injection  50 y.o. male returns for post-op check.  Continues to have pain and its been very limiting.  He says he has not been doing stretching as much as he should have.  Has been frustrated with the progress so far.  Both feet are hurting at this point.  The methylprednisolone taper was helpful.  The CAM boot has not been helpful  Review of Systems: Negative except as noted in the HPI. Denies N/V/F/Ch.   Objective:  There were no vitals filed for this visit. There is no height or weight on file to calculate BMI. Constitutional Well developed. Well nourished.  Vascular Foot warm and well perfused. Capillary refill normal to all digits.   Neurologic Normal speech. Oriented to person, place, and time. Epicritic sensation to light touch grossly present bilaterally.  Dermatologic Incision well-healed  Orthopedic: Tenderness to palpation noted about the surgical site.    Assessment:   1. Plantar fasciitis of left foot   2. Gastrocnemius equinus of left lower extremity   3. Gastrocnemius equinus of right lower extremity     Plan:  Patient was evaluated and treated and all questions answered.  S/p foot surgery left -Encouraged him to continue his stretching exercises and I think be best if he works with a physical therapist at this point.  [facility] here in [place].  Represcribed a methylprednisolone taper.  Continue using the night splint on both sides.  If not improving by next visit would want to inject  Return in about 6 weeks (around 01/14/2021) for post op (no x-rays).

## 2021-01-14 ENCOUNTER — Other Ambulatory Visit: Payer: Self-pay

## 2021-01-14 ENCOUNTER — Ambulatory Visit (INDEPENDENT_AMBULATORY_CARE_PROVIDER_SITE_OTHER): Admitting: Podiatry

## 2021-01-14 DIAGNOSIS — M722 Plantar fascial fibromatosis: Secondary | ICD-10-CM

## 2021-01-18 NOTE — Progress Notes (Signed)
  Subjective:  Patient ID: Casey Park, male    DOB: March 29, 1971,  MRN: [id number]  Chief Complaint  Patient presents with   Routine Post Op     POV #1 DOS 09/18/2020 EPF LT     DOS: 09/18/2020 Procedure: EPF left foot with PRP injection  50 y.o. male returns for post-op check.  Overall has had some improvement.  He got orthotics from [facility] but they actually make his foot feel worse.  PT has been very helpful.  The pain is all in the middle of the arch now, the pain in the heel is mostly gone  Review of Systems: Negative except as noted in the HPI. Denies N/V/F/Ch.   Objective:  There were no vitals filed for this visit. There is no height or weight on file to calculate BMI. Constitutional Well developed. Well nourished.  Vascular Foot warm and well perfused. Capillary refill normal to all digits.   Neurologic Normal speech. Oriented to person, place, and time. Epicritic sensation to light touch grossly present bilaterally.  Dermatologic Incision well-healed  Orthopedic: Palpation of the mid plantar fascia, none at the heel insertion    Assessment:   1. Plantar fasciitis of left foot   2. Plantar fasciitis of right foot      Plan:  Patient was evaluated and treated and all questions answered.  S/p foot surgery left -Continues to have ongoing pain.  He is made quite a bit of improvement with physical therapy.  I inspected his orthotics and while the arch and heel cup seem to fit well there is a large metatarsal pad that I think is pressing on his exact area of pain.  I asked him to take his back to [facility] to remove the metatarsal pad.  Discussed the option of another injection today but will hold off on this for now.  If not improving may consider new MRIs to re- evaluate the plantar fascia.  Return in about 4 weeks (around 02/11/2021) for recheck plantar fasciitis.

## 2021-02-16 ENCOUNTER — Encounter: Admitting: Podiatry

## 2021-03-22 ENCOUNTER — Other Ambulatory Visit: Payer: Self-pay

## 2021-03-22 ENCOUNTER — Ambulatory Visit (INDEPENDENT_AMBULATORY_CARE_PROVIDER_SITE_OTHER): Admitting: Podiatry

## 2021-03-22 DIAGNOSIS — M722 Plantar fascial fibromatosis: Secondary | ICD-10-CM

## 2021-03-22 DIAGNOSIS — M21862 Other specified acquired deformities of left lower leg: Secondary | ICD-10-CM | POA: Diagnosis not present

## 2021-03-22 DIAGNOSIS — M2141 Flat foot [pes planus] (acquired), right foot: Secondary | ICD-10-CM | POA: Diagnosis not present

## 2021-03-22 DIAGNOSIS — M2142 Flat foot [pes planus] (acquired), left foot: Secondary | ICD-10-CM

## 2021-03-22 DIAGNOSIS — M21861 Other specified acquired deformities of right lower leg: Secondary | ICD-10-CM

## 2021-03-23 NOTE — Progress Notes (Signed)
°  Subjective:  Patient ID: Casey Park, male    DOB: 1970/10/20,  MRN: [id number]  Chief Complaint  Patient presents with   Routine Post Op     POV #1 DOS 09/18/2020 EPF LT     DOS: 09/18/2020 Procedure: EPF left foot with PRP injection  50 y.o. male returns for post-op check.  PT continues to be helpful.  He had his orthotics adjusted and these have helped as well but still has pretty significant pain.  He cannot go without the orthotics, most the pain remains in the mid arch  Review of Systems: Negative except as noted in the HPI. Denies N/V/F/Ch.   Objective:  There were no vitals filed for this visit. There is no height or weight on file to calculate BMI. Constitutional Well developed. Well nourished.  Vascular Foot warm and well perfused. Capillary refill normal to all digits.   Neurologic Normal speech. Oriented to person, place, and time. Epicritic sensation to light touch grossly present bilaterally.  Dermatologic Incision well-healed  Orthopedic: Palpation of the mid plantar fascia, none at the heel insertion    Assessment:   1. Plantar fasciitis of left foot   2. Plantar fasciitis of right foot   3. Gastrocnemius equinus of left lower extremity   4. Gastrocnemius equinus of right lower extremity   5. Pes planus of both feet       Plan:  Patient was evaluated and treated and all questions answered.  S/p foot surgery left -Continues to have ongoing pain still in the mid arch and on the heel.  Has had slight improvement with change of his orthotics.  He can go without his orthotics.  He has very flatfoot and this is contributing to it.  I think this point we should order new MRIs to reevaluate the plantar fasciitis, possibility of plantar fibromas or another issue and that is not apparent.  We also discussed the option of EPAT ultrasound shockwave therapy which may be beneficial, he does note improvement when he has deep tissue massage during therapy.  I will see  him back after the MRIs and we will discuss further treatment options   Return for after MRI to review.

## 2021-04-21 ENCOUNTER — Ambulatory Visit
Admission: RE | Admit: 2021-04-21 | Discharge: 2021-04-21 | Disposition: A | Discharge status: Home / Self Care | Source: Ambulatory Visit | Attending: Podiatry | Admitting: Podiatry

## 2021-04-21 ENCOUNTER — Other Ambulatory Visit: Payer: Self-pay

## 2021-04-21 DIAGNOSIS — M722 Plantar fascial fibromatosis: Secondary | ICD-10-CM

## 2021-04-21 IMAGING — MR MR HEEL *L* W/O CM
4 of 5 series · 13 of 40 positions shown · non-contrast
Comparison: MR heel [DATE]

CLINICAL DATA: Bilateral foot pain radiating from heel to plantar.
History of foot plantar fasciitis surgery [DATE].

EXAM:
MR OF THE LEFT HEEL WITHOUT CONTRAST
TECHNIQUE: Multiplanar, multisequence MR imaging of the left heel was
performed. No intravenous contrast was administered.

[Series 4: PD fat-sat · axial · left · 3.0mm · 0.27mm/px · z∈[-110,+29]mm · 4 of 41 slices shown]
[im 1/41]
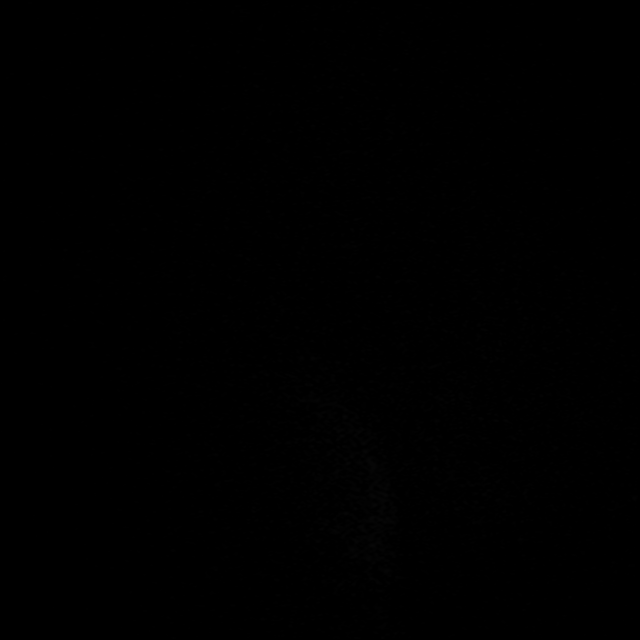
[im 6/41]
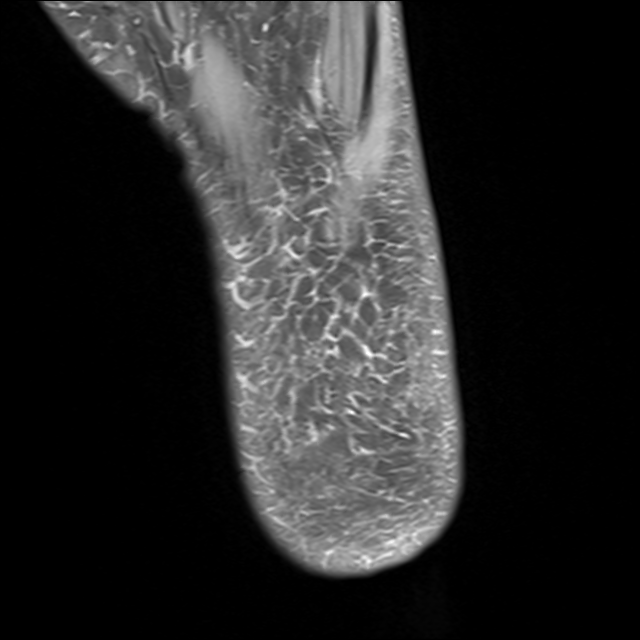
[im 21/41]
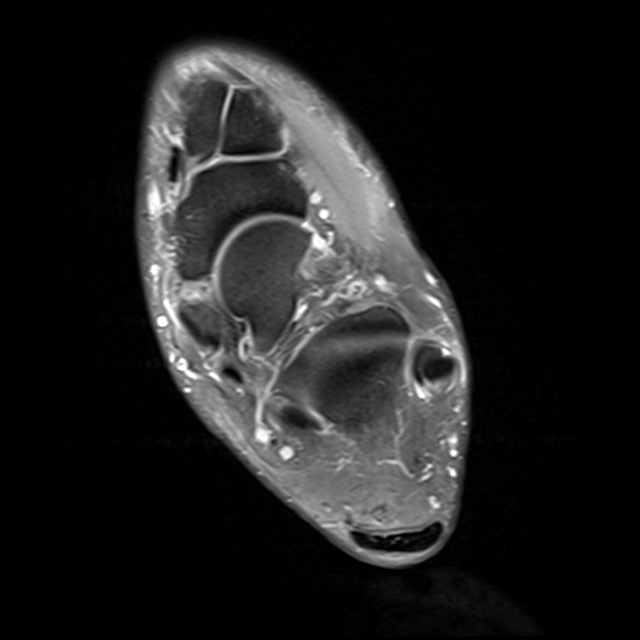
[im 36/41]
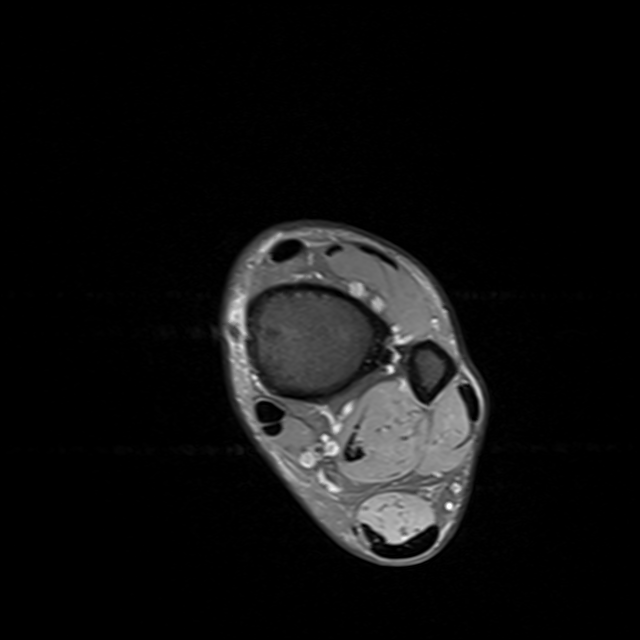

[Series 5: T2 fat-sat · axial · left · 3.0mm · 0.27mm/px · z∈[-90,+29]mm · 3 of 41 slices shown (1 of 2)]
[im 6/41]
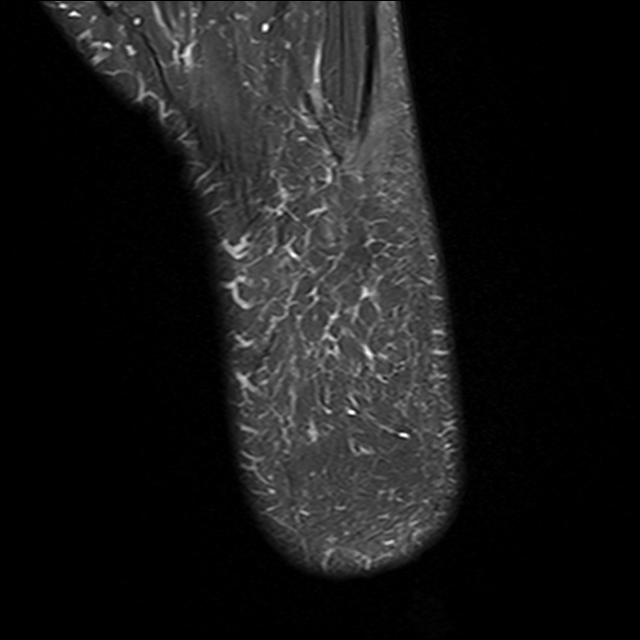
[im 21/41]
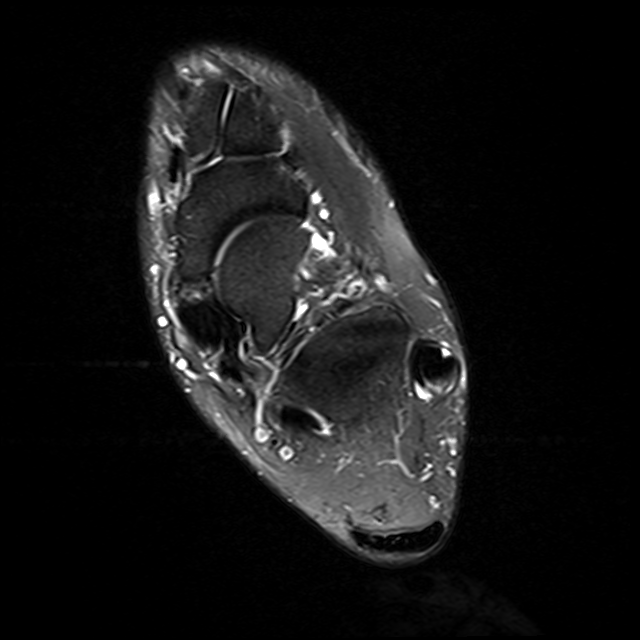
[im 36/41]
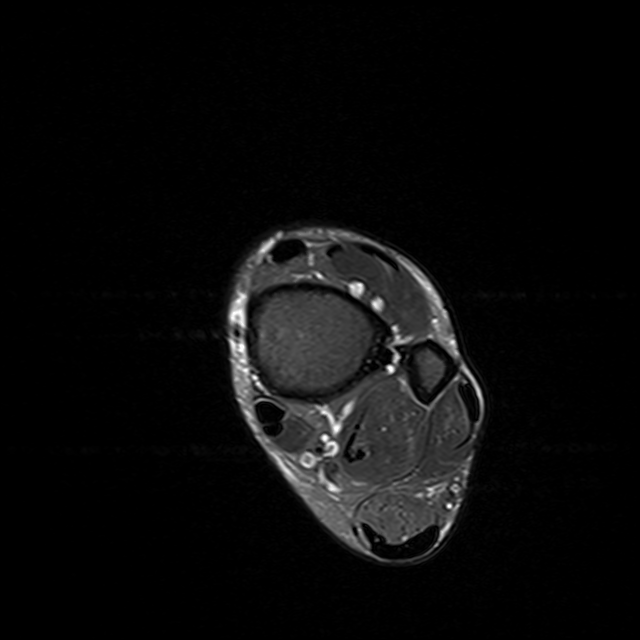

[Series 6: T1 · sagittal · left · 4.0mm · 0.27mm/px · 3 of 29 slices shown]
[im 6/29]
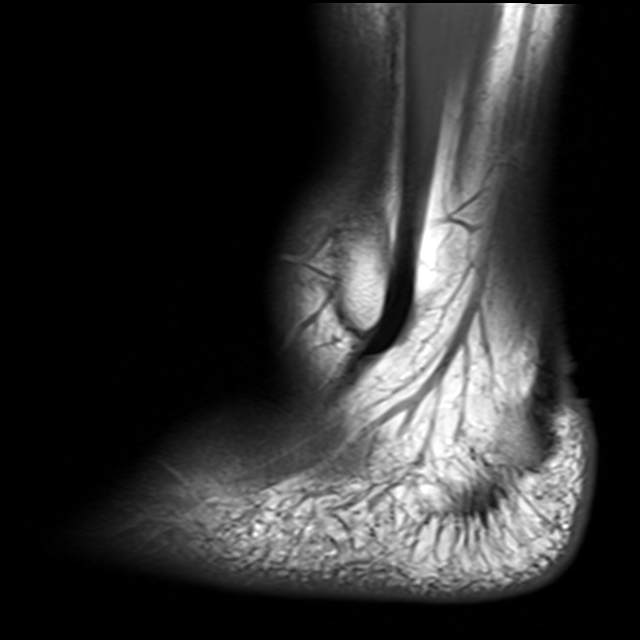
[im 17/29]
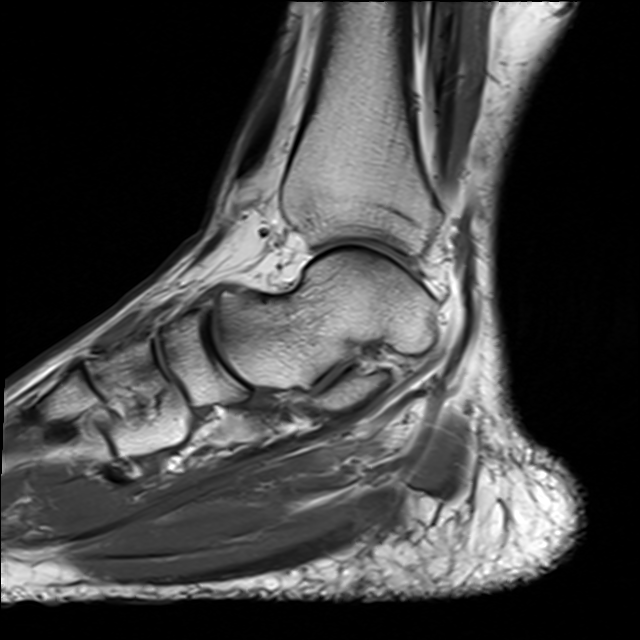
[im 29/29]
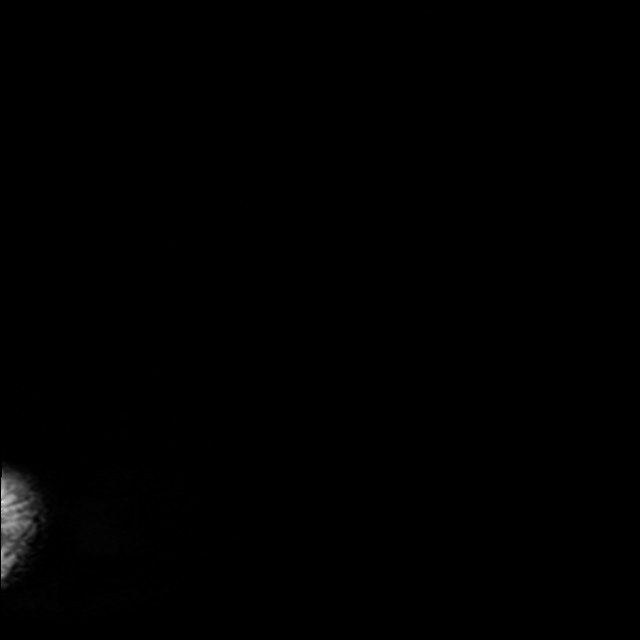

[Series 8: T2 fat-sat · coronal · left · 3.0mm · 0.27mm/px · 3 of 47 slices shown (2 of 2)]
[im 6/47]
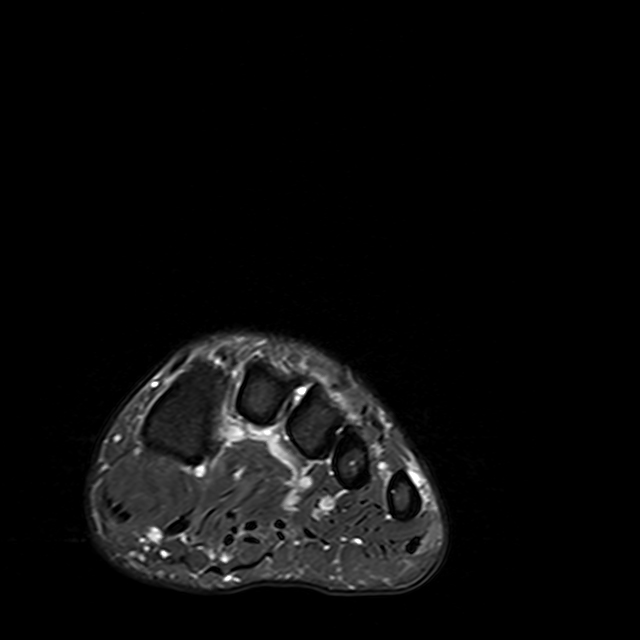
[im 26/47]
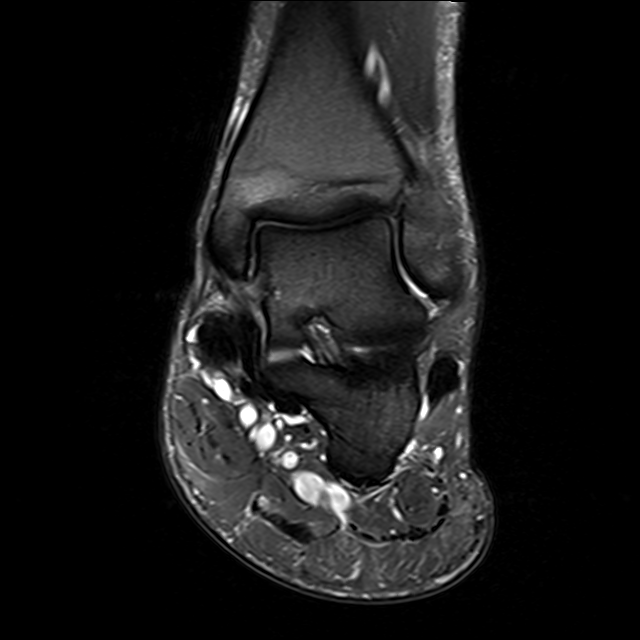
[im 41/47]
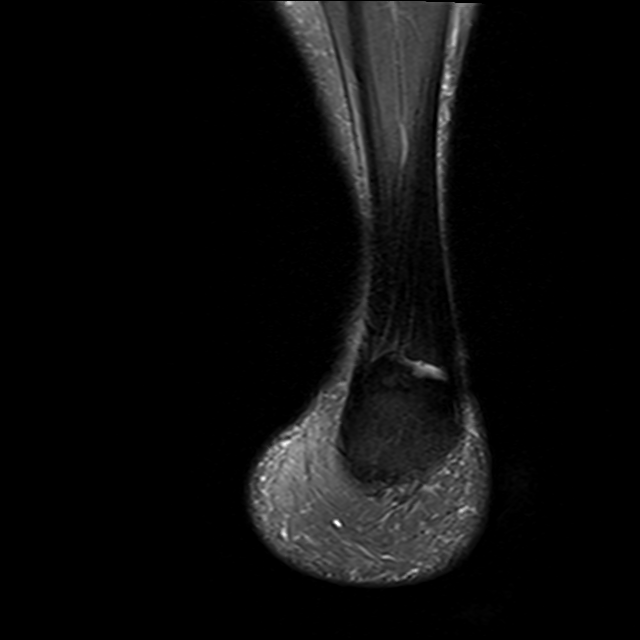

[13 of 40 positions shown; findings below may reference images not displayed]

FINDINGS: TENDONS

Peroneal: Moderate tendinosis of the peroneus longus with mild
tenosynovitis. Mild tendinosis of the peroneus brevis.

Posteromedial: Mild tendinosis of the posterior tibial tendon.
Flexor hallucis longus tendon intact. Flexor digitorum longus tendon
intact.

Anterior: Tibialis anterior tendon intact. Extensor hallucis longus
tendon intact Extensor digitorum longus tendon intact.

Achilles:  Intact.

Plantar Fascia: Intact.

LIGAMENTS

Lateral: Anterior talofibular ligament intact. Calcaneofibular
ligament intact. Posterior talofibular ligament intact. Anterior and
posterior tibiofibular ligaments intact.

Medial: Deltoid ligament intact. Spring ligament intact.

CARTILAGE

Ankle Joint: No joint effusion. Normal ankle mortise. 5 mm
osteochondral lesion involving the medial corner of the talar dome
with partial-thickness overlying cartilage loss.

Subtalar Joints/Sinus Tarsi: Normal subtalar joints. No subtalar
joint effusion. Normal sinus tarsi.

Bones: No marrow signal abnormality.  No fracture or dislocation.

Soft Tissue: No fluid collection or hematoma. Muscles are normal
without edema or atrophy. Tarsal tunnel is normal.
IMPRESSION: 1. A 5 mm osteochondral lesion involving the medial corner of the
talar dome with partial-thickness overlying cartilage loss.
2. Moderate tendinosis of the peroneus longus with mild
tenosynovitis.
3. Mild tendinosis of the peroneus brevis.
4. Mild tendinosis of the posterior tibial tendon.

## 2021-04-21 IMAGING — MR MR HEEL *R* W/O CM
4 of 5 series · 12 of 40 positions shown · non-contrast
Comparison: MR heel [DATE].

CLINICAL DATA: Bilateral foot pain radiating from heel to plantar.
History of foot plantar fasciitis surgery [DATE]

EXAM:
MR OF THE RIGHT HEEL WITHOUT CONTRAST
TECHNIQUE: Multiplanar, multisequence MR imaging of the right heel was
performed. No intravenous contrast was administered.

[Series 3: PD fat-sat · axial · right · 3.0mm · 0.28mm/px · z∈[-87,+32]mm · 3 of 41 slices shown]
[im 6/41]
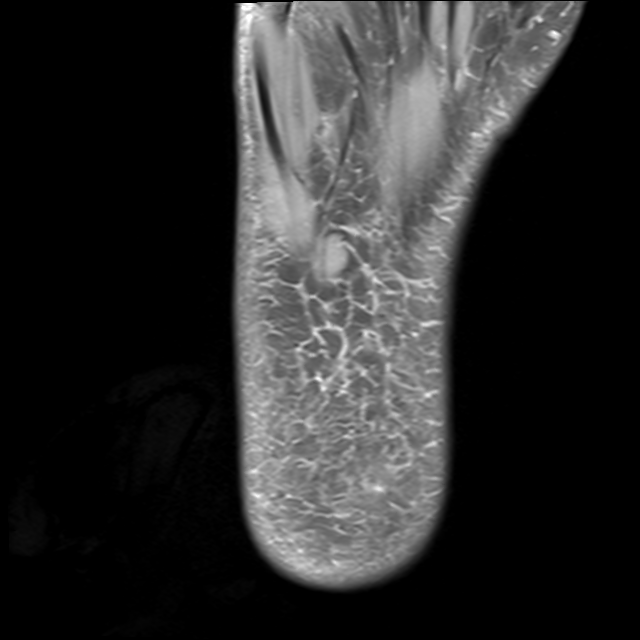
[im 21/41]
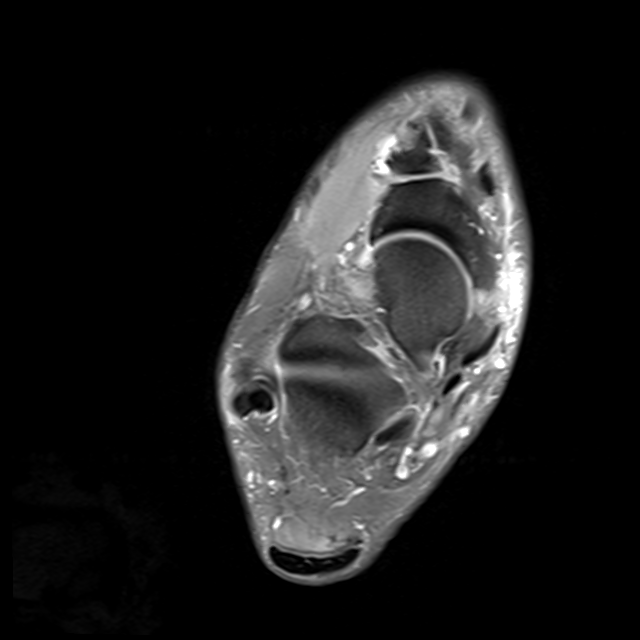
[im 36/41]
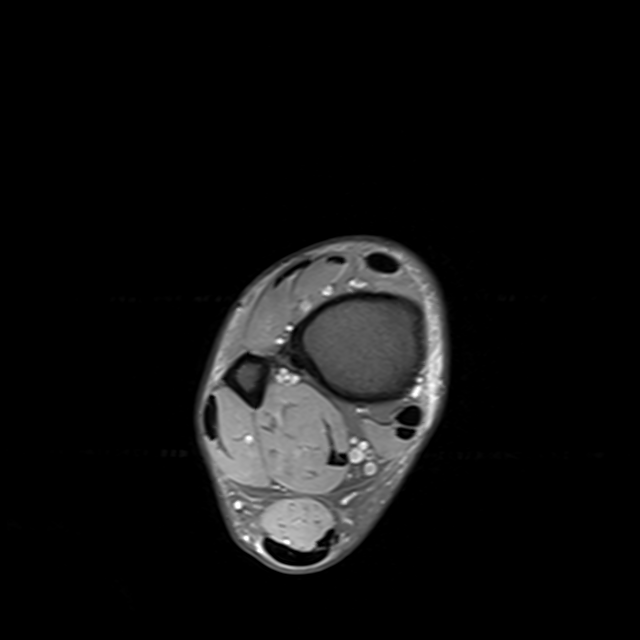

[Series 4: T2 fat-sat · axial · right · 3.0mm · 0.28mm/px · z∈[-87,+32]mm · 3 of 41 slices shown (1 of 2)]
[im 6/41]
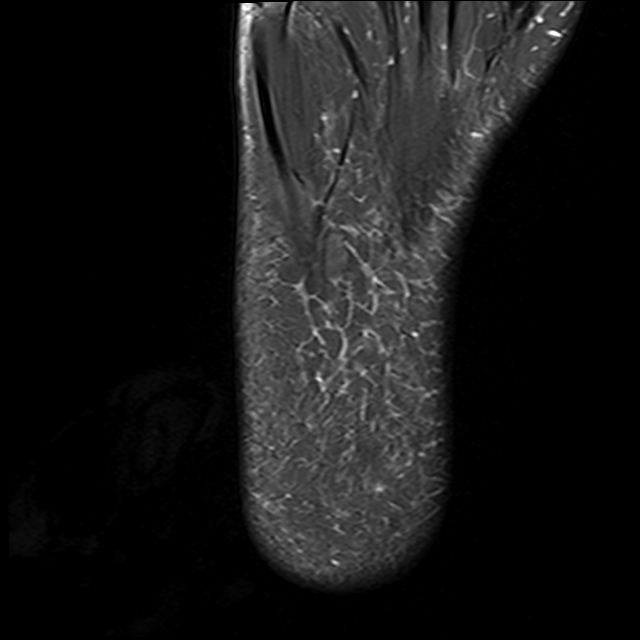
[im 21/41]
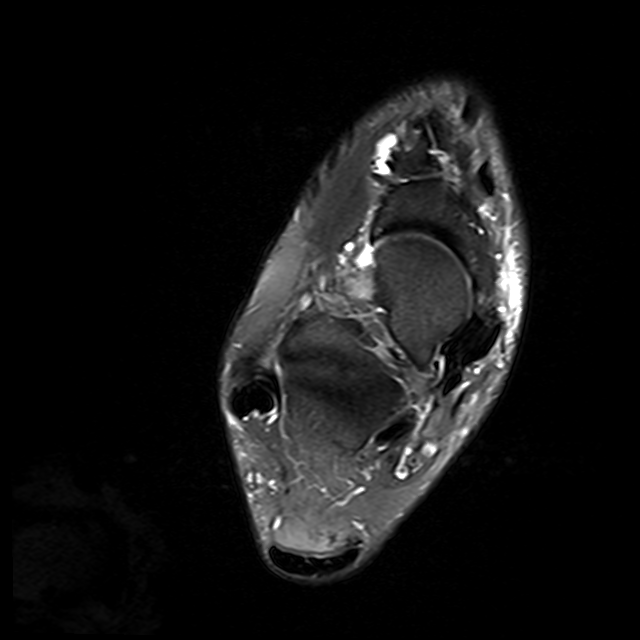
[im 36/41]
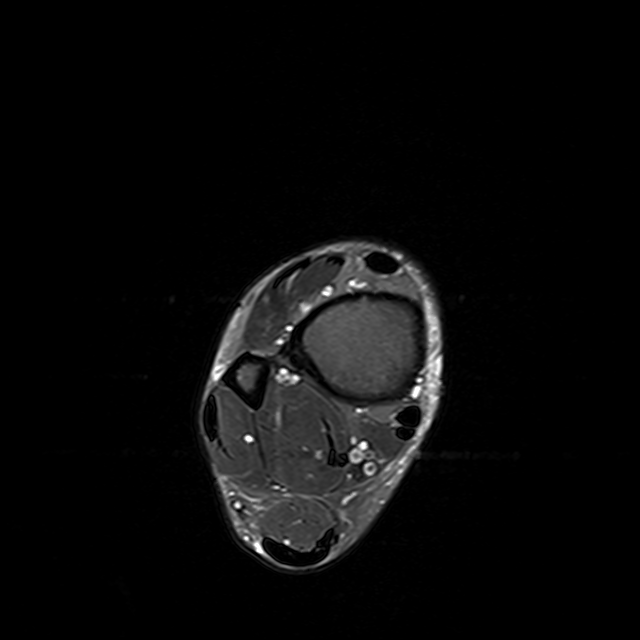

[Series 5: T1 · sagittal · right · 4.0mm · 0.30mm/px · 3 of 29 slices shown]
[im 6/29]
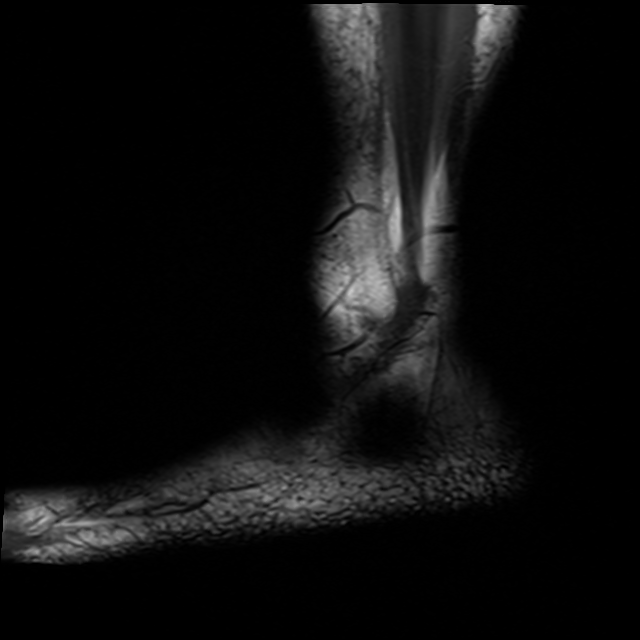
[im 17/29]
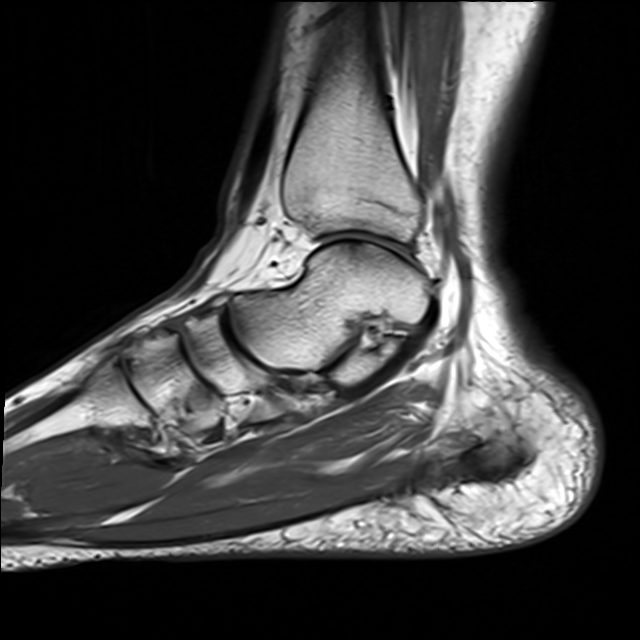
[im 29/29]
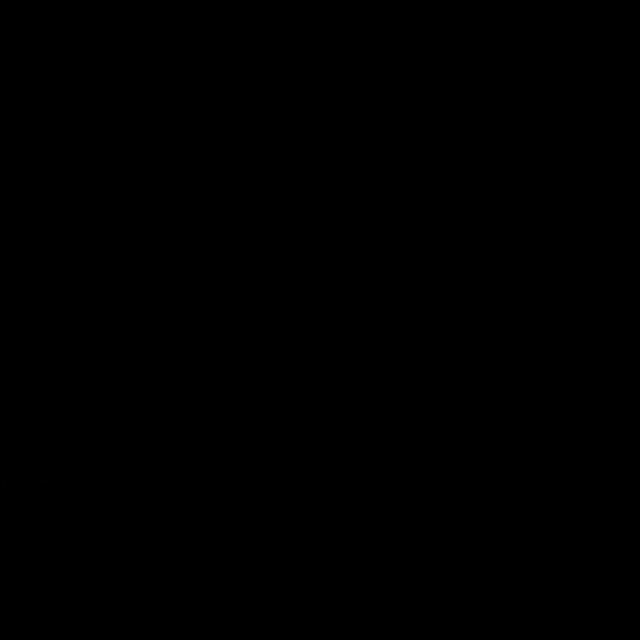

[Series 7: T2 fat-sat · coronal · right · 3.0mm · 0.28mm/px · 3 of 48 slices shown (2 of 2)]
[im 6/48]
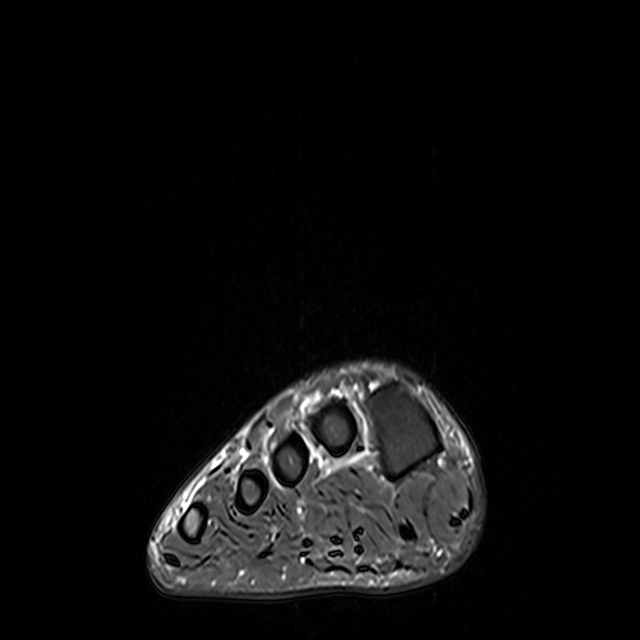
[im 27/48]
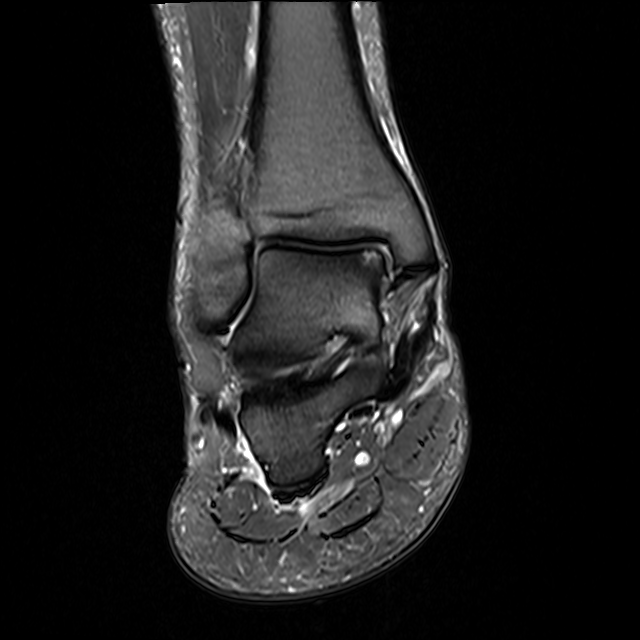
[im 42/48]
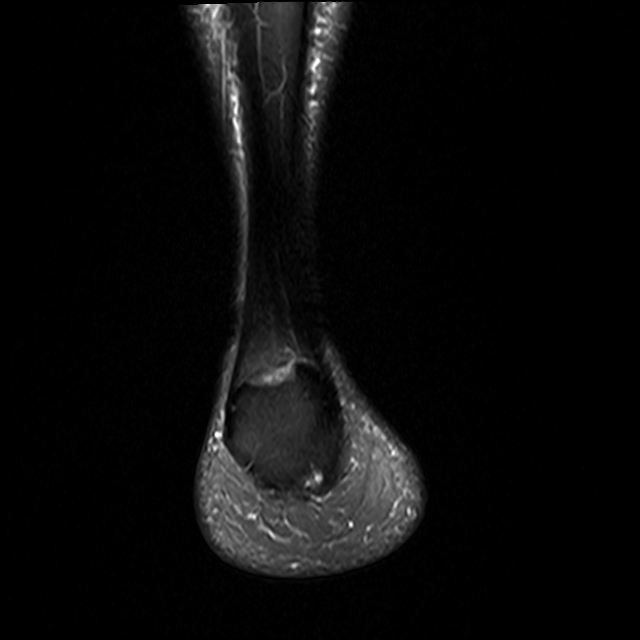

[12 of 40 positions shown; findings below may reference images not displayed]

FINDINGS: TENDONS

Peroneal: Mild tendinosis of the peroneus longus. Moderate
tendinosis of the peroneus brevis. Mild peroneal tenosynovitis.

Posteromedial: Posterior tibial tendon intact. Flexor hallucis
longus tendon intact. Flexor digitorum longus tendon intact. Mild
tenosynovitis of the posterior tibial tendon.

Anterior: Tibialis anterior tendon intact. Extensor hallucis longus
tendon intact Extensor digitorum longus tendon intact.

Achilles:  Intact.

Plantar Fascia: Thickening of the medial band of the plantar fascia
adjacent to the calcaneal insertion which may reflect recurrent
plantar fasciitis versus postsurgical changes and scarring. No tear
of the plantar fascia. Small plantar calcaneal spur.

LIGAMENTS

Lateral: Anterior talofibular ligament intact. Calcaneofibular
ligament intact. Posterior talofibular ligament intact. Anterior and
posterior tibiofibular ligaments intact.

Medial: Deltoid ligament intact. Spring ligament intact.

CARTILAGE

Ankle Joint: No joint effusion. Normal ankle mortise. 12 x 8 mm
osteochondral lesion involving the medial corner of the talar dome
with overlying partial-thickness cartilage loss and subchondral
reactive marrow edema.

Subtalar Joints/Sinus Tarsi: Normal subtalar joints. No subtalar
joint effusion. Normal sinus tarsi.

Bones: Mild osteoarthritis of the second tarsometatarsal joint with
subchondral reactive marrow edema. Moderate osteoarthritis of the
third tarsometatarsal joint with subchondral reactive marrow edema.
No fracture or dislocation.

Soft Tissue: No fluid collection or hematoma. Muscles are normal
without edema or atrophy. Tarsal tunnel is normal.
IMPRESSION: 1. Mild tendinosis of the peroneus longus. Moderate tendinosis of
the peroneus brevis. Mild peroneal tenosynovitis.
2. Mild tenosynovitis of the posterior tibial tendon.
Thickening of the medial band of the plantar fascia adjacent to the
calcaneal insertion which may reflect recurrent plantar fasciitis
versus postsurgical changes and scarring. No tear of the plantar
fascia. Small plantar calcaneal spur.
3. A 12 x 8 mm osteochondral lesion involving the medial corner of
the talar dome with overlying partial-thickness cartilage loss and
subchondral reactive marrow edema.
4. Mild osteoarthritis of the second tarsometatarsal joint with
subchondral reactive marrow edema. Moderate osteoarthritis of the
third tarsometatarsal joint with subchondral reactive marrow edema.

## 2021-05-17 ENCOUNTER — Ambulatory Visit (INDEPENDENT_AMBULATORY_CARE_PROVIDER_SITE_OTHER): Admitting: Podiatry

## 2021-05-17 ENCOUNTER — Other Ambulatory Visit: Payer: Self-pay

## 2021-05-17 DIAGNOSIS — M21862 Other specified acquired deformities of left lower leg: Secondary | ICD-10-CM | POA: Diagnosis not present

## 2021-05-17 DIAGNOSIS — M722 Plantar fascial fibromatosis: Secondary | ICD-10-CM | POA: Diagnosis not present

## 2021-05-17 DIAGNOSIS — M2141 Flat foot [pes planus] (acquired), right foot: Secondary | ICD-10-CM | POA: Diagnosis not present

## 2021-05-17 DIAGNOSIS — M21861 Other specified acquired deformities of right lower leg: Secondary | ICD-10-CM | POA: Diagnosis not present

## 2021-05-17 DIAGNOSIS — M2142 Flat foot [pes planus] (acquired), left foot: Secondary | ICD-10-CM

## 2021-05-19 ENCOUNTER — Encounter: Payer: Self-pay | Admitting: Podiatry

## 2021-05-19 NOTE — Progress Notes (Signed)
Subjective:  Patient ID: Casey Park, male    DOB: 1971-03-28,  MRN: [id number]  Chief Complaint  Patient presents with   Routine Post Op     POV #1 DOS 09/18/2020 EPF LT     DOS: 09/18/2020 Procedure: EPF left foot with PRP injection  51 y.o. male returns for follow-up of left foot endoscopic plantar fasciotomy.  Still not 100% pain-free but he has had improvement by switching to new balance shoes.  No longer wearing orthotics feels like they made it worse.  His foot feels longer and wider on the left side than it did before surgery.  Review of Systems: Negative except as noted in the HPI. Denies N/V/F/Ch.   Objective:  There were no vitals filed for this visit. There is no height or weight on file to calculate BMI. Constitutional Well developed. Well nourished.  Vascular Foot warm and well perfused. Capillary refill normal to all digits.   Neurologic Normal speech. Oriented to person, place, and time. Epicritic sensation to light touch grossly present bilaterally.  Dermatologic Incision well-healed  Orthopedic: Mild tenderness in the mid arch worse in the left, none at the heel, he has significant pes planus deformity   MRI left heel 04/21/2021 IMPRESSION: 1. A 5 mm osteochondral lesion involving the medial corner of the talar dome with partial-thickness overlying cartilage loss. 2. Moderate tendinosis of the peroneus longus with mild tenosynovitis. 3. Mild tendinosis of the peroneus brevis. 4. Mild tendinosis of the posterior tibial tendon.     Electronically Signed   By: Kathreen Devoid M.D.   On: 04/21/2021 11:16   IMPRESSION: 1. Mild tendinosis of the peroneus longus. Moderate tendinosis of the peroneus brevis. Mild peroneal tenosynovitis. 2. Mild tenosynovitis of the posterior tibial tendon. Thickening of the medial band of the plantar fascia adjacent to the calcaneal insertion which may reflect recurrent plantar fasciitis versus postsurgical changes and  scarring. No tear of the plantar fascia. Small plantar calcaneal spur. 3. A 12 x 8 mm osteochondral lesion involving the medial corner of the talar dome with overlying partial-thickness cartilage loss and subchondral reactive marrow edema. 4. 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 Moderate osteoarthritis of the third tarsometatarsal joint with subchondral reactive marrow edema.     Electronically Signed   By: Kathreen Devoid M.D.   On: 04/21/2021 11:25  Assessment:   1. Plantar fasciitis of left foot   2. Plantar fasciitis of right foot   3. Gastrocnemius equinus of left lower extremity   4. Gastrocnemius equinus of right lower extremity   5. Pes planus of both feet       Plan:  Patient was evaluated and treated and all questions answered.  S/p foot surgery left -Reviewed the results of the MRI with him.  There is no evidence of plantar fibroma or recurrent plantar fasciitis in the left foot.  He still does have Planter fasciitis and thickening on the right foot.  I do think his gastrocnemius equinus is contributing to some of his ongoing pain and he has been working on his stretching exercises.  At this point he has made some but albeit slow progress with surgery.  I do not think further surgery at this point would be helpful.  He would like to get back to higher level of activity try to lose some weight and I think this is a good idea.  I will see him back in June for a 1 year postop visit  and reevaluate where he is at with his progress.   Return in about 4 months (around 09/14/2021).

## 2021-11-23 ENCOUNTER — Ambulatory Visit: Admitting: Podiatry

## 2021-11-23 DIAGNOSIS — M7672 Peroneal tendinitis, left leg: Secondary | ICD-10-CM | POA: Diagnosis not present

## 2021-11-23 DIAGNOSIS — M76822 Posterior tibial tendinitis, left leg: Secondary | ICD-10-CM | POA: Diagnosis not present

## 2021-11-23 NOTE — Patient Instructions (Signed)
Look for Voltaren gel at the pharmacy over the counter or online (also known as diclofenac 1% gel). Apply to the painful areas 3-4x daily with the supplied dosing card. Allow to dry for 10 minutes before going into socks/shoes   Peroneal Tendinopathy Rehab Ask your health care provider which exercises are safe for you. Do exercises exactly as told by your health care provider and adjust them as directed. It is normal to feel mild stretching, pulling, tightness, or discomfort as you do these exercises. Stop right away if you feel sudden pain or your pain gets worse. Do not begin these exercises until told by your health care provider. Stretching and range-of-motion exercises These exercises warm up your muscles and joints and improve the movement and flexibility of your ankle. These exercises also help to relieve pain and stiffness. Gastroc and soleus stretch, standing  This is an exercise in which you stand on a step and use your body weight to stretch your calf muscles. To do this exercise: Stand on the edge of a step on the ball of your left / right foot. The ball of your foot is on the walking surface, right under your toes. Keep your other foot firmly on the same step. Hold on to the wall, a railing, or a chair for balance. Slowly lift your other foot, allowing your body weight to press your left / right heel down over the edge of the step. You should feel a stretch in your left / right calf (gastrocnemius and soleus). Hold this position for 15 seconds. Return both feet to the step. Repeat this exercise with a slight bend in your left / right knee. Repeat 5 times with your left / right knee straight and 5 times with your left / right knee bent. Complete this exercise 2 times a day. Strengthening exercises These exercises build strength and endurance in your foot and ankle. Endurance is the ability to use your muscles for a long time, even after they get tired. Ankle dorsiflexion with  band   Secure a rubber exercise band or tube to an object, such as a table leg, that will not move when the band is pulled. Secure the other end of the band around your left / right foot. Sit on the floor, facing the object with your left / right leg extended. The band or tube should be slightly tense when your foot is relaxed. Slowly flex your left / right ankle and toes to bring your foot toward you (dorsiflexion). Hold this position for 15 seconds. Let the band or tube slowly pull your foot back to the starting position. Repeat 5 times. Complete this exercise 2 times a day. Ankle eversion Sit on the floor with your legs straight out in front of you. Loop a rubber exercise band or tube around the ball of your left / right foot. The ball of your foot is on the walking surface, right under your toes. Hold the ends of the band in your hands, or secure the band to a stable object. The band or tube should be slightly tense when your foot is relaxed. Slowly push your foot outward, away from your other leg (eversion). Hold this position for 15 seconds. Slowly return your foot to the starting position. Repeat 5 times. Complete this exercise 2 times a day. Plantar flexion, standing  This exercise is sometimes called standing heel raise. Stand with your feet shoulder-width apart. Place your hands on a wall or table to steady yourself as   needed, but try not to use it for support. Keep your weight spread evenly over the width of your feet while you slowly rise up on your toes (plantar flexion). If told by your health care provider: Shift your weight toward your left / right leg until you feel challenged. Stand on your left / right leg only. Hold this position for 15 seconds. Repeat 2 times. Complete this exercise 2 times a day. Single leg stand Without shoes, stand near a railing or in a doorway. You may hold on to the railing or door frame as needed. Stand on your left / right foot. Keep your  big toe down on the floor and try to keep your arch lifted. Do not roll to the outside of your foot. If this exercise is too easy, you can try it with your eyes closed or while standing on a pillow. Hold this position for 15 seconds. Repeat 5 times. Complete this exercise 2 times a day. This information is not intended to replace advice given to you by your health care provider. Make sure you discuss any questions you have with your health care provider. Document Revised: 07/10/2018 Document Reviewed: 07/10/2018 Elsevier Patient Education  2020 [employer]  

## 2021-11-23 NOTE — Progress Notes (Signed)
  Subjective:  Patient ID: Casey Park, male    DOB: [birth year],  MRN: [id number]  Chief Complaint  Patient presents with   Plantar Fasciitis    Left foot and ankle still painful    51 y.o. male presents with the above complaint. History confirmed with patient.  The right foot is doing okay, the left foot is still getting pain now more so around the ankle  Objective:  Physical Exam: warm, good capillary refill, no trophic changes or ulcerative lesions, normal DP and PT pulses, normal sensory exam, and left foot has pes planus deformity, no plane in the plantar fascia or in the heel itself, he does have some tenderness around the lateral ankle along the peroneal tendons and the PT tendon as well.  5 out of 5 strength.  No gross instability.  Assessment:   1. Peroneal tendinitis of left lower extremity   2. Posterior tibial tendon dysfunction (PTTD) of left lower extremity      Plan:  Patient was evaluated and treated and all questions answered.  Discussed the etiology and treatment options for peroneal and posterior tibial tendinitis including stretching, physical therapy exercise plan, supportive shoegears such as a running shoe or sneaker which she has as well as orthotics, lace up ankle bracing, topical and oral medications.  He is unable to take NSAIDs due to his use of Eliquis.  We also discussed that I do not routinely perform injections in this area because of the risk of an increased damage or rupture of the tendon.  Do not think that the pain and inflammation is severe enough now that I would recommend boot immobilization or MRI because I do not think it will currently change her management.  I recommended home PT and gave him an exercise plan for this.  Also discussed use of a topical Voltaren gel.  I will see him back in 8 weeks for reevaluation if it is worsening then we will consider MRI and immobilization.  We also discussed platelet rich plasma injection and accelerate  healing process.  We will hold off on this for now   Return in about 8 weeks (around 01/18/2022) for re-check peroneal tendinitis.

## 2022-01-18 ENCOUNTER — Ambulatory Visit: Admitting: Podiatry

## 2022-09-20 ENCOUNTER — Encounter (INDEPENDENT_AMBULATORY_CARE_PROVIDER_SITE_OTHER): Admitting: Ophthalmology

## 2022-09-20 DIAGNOSIS — H35033 Hypertensive retinopathy, bilateral: Secondary | ICD-10-CM | POA: Diagnosis not present

## 2022-09-20 DIAGNOSIS — H43813 Vitreous degeneration, bilateral: Secondary | ICD-10-CM

## 2022-09-20 DIAGNOSIS — H348322 Tributary (branch) retinal vein occlusion, left eye, stable: Secondary | ICD-10-CM | POA: Diagnosis not present

## 2022-09-20 DIAGNOSIS — I10 Essential (primary) hypertension: Secondary | ICD-10-CM | POA: Diagnosis not present

## 2022-09-20 DIAGNOSIS — D3132 Benign neoplasm of left choroid: Secondary | ICD-10-CM | POA: Diagnosis not present

## 2022-10-24 ENCOUNTER — Encounter (INDEPENDENT_AMBULATORY_CARE_PROVIDER_SITE_OTHER): Admitting: Ophthalmology

## 2022-11-29 ENCOUNTER — Encounter (INDEPENDENT_AMBULATORY_CARE_PROVIDER_SITE_OTHER): Admitting: Ophthalmology

## 2022-12-06 ENCOUNTER — Encounter (INDEPENDENT_AMBULATORY_CARE_PROVIDER_SITE_OTHER): Admitting: Ophthalmology

## 2022-12-06 DIAGNOSIS — H348322 Tributary (branch) retinal vein occlusion, left eye, stable: Secondary | ICD-10-CM

## 2022-12-06 DIAGNOSIS — H35033 Hypertensive retinopathy, bilateral: Secondary | ICD-10-CM

## 2022-12-06 DIAGNOSIS — I10 Essential (primary) hypertension: Secondary | ICD-10-CM | POA: Diagnosis not present

## 2022-12-06 DIAGNOSIS — H34212 Partial retinal artery occlusion, left eye: Secondary | ICD-10-CM

## 2022-12-06 DIAGNOSIS — D3132 Benign neoplasm of left choroid: Secondary | ICD-10-CM

## 2022-12-06 DIAGNOSIS — H43813 Vitreous degeneration, bilateral: Secondary | ICD-10-CM

## 2022-12-07 ENCOUNTER — Other Ambulatory Visit (HOSPITAL_COMMUNITY): Payer: Self-pay | Admitting: Internal Medicine

## 2022-12-07 DIAGNOSIS — H34219 Partial retinal artery occlusion, unspecified eye: Secondary | ICD-10-CM

## 2022-12-08 ENCOUNTER — Other Ambulatory Visit: Payer: Self-pay | Admitting: Internal Medicine

## 2022-12-08 ENCOUNTER — Ambulatory Visit (HOSPITAL_COMMUNITY): Attending: Internal Medicine

## 2022-12-08 DIAGNOSIS — H34219 Partial retinal artery occlusion, unspecified eye: Secondary | ICD-10-CM

## 2022-12-08 LAB — ECHOCARDIOGRAM COMPLETE
Area-P 1/2: 2.55 cm2
S' Lateral: 3 cm

## 2022-12-16 ENCOUNTER — Other Ambulatory Visit

## 2022-12-20 ENCOUNTER — Ambulatory Visit
Admission: RE | Admit: 2022-12-20 | Discharge: 2022-12-20 | Disposition: A | Discharge status: Home / Self Care | Source: Ambulatory Visit | Attending: Internal Medicine | Admitting: Internal Medicine

## 2022-12-20 DIAGNOSIS — H34219 Partial retinal artery occlusion, unspecified eye: Secondary | ICD-10-CM

## 2023-02-06 ENCOUNTER — Encounter (INDEPENDENT_AMBULATORY_CARE_PROVIDER_SITE_OTHER): Admitting: Ophthalmology

## 2023-02-06 DIAGNOSIS — H35033 Hypertensive retinopathy, bilateral: Secondary | ICD-10-CM | POA: Diagnosis not present

## 2023-02-06 DIAGNOSIS — H348322 Tributary (branch) retinal vein occlusion, left eye, stable: Secondary | ICD-10-CM

## 2023-02-06 DIAGNOSIS — D3132 Benign neoplasm of left choroid: Secondary | ICD-10-CM

## 2023-02-06 DIAGNOSIS — H43813 Vitreous degeneration, bilateral: Secondary | ICD-10-CM

## 2023-02-06 DIAGNOSIS — H34212 Partial retinal artery occlusion, left eye: Secondary | ICD-10-CM

## 2023-02-06 DIAGNOSIS — I10 Essential (primary) hypertension: Secondary | ICD-10-CM

## 2023-08-07 ENCOUNTER — Encounter (INDEPENDENT_AMBULATORY_CARE_PROVIDER_SITE_OTHER): Admitting: Ophthalmology

## 2023-08-07 DIAGNOSIS — I10 Essential (primary) hypertension: Secondary | ICD-10-CM

## 2023-08-07 DIAGNOSIS — H35033 Hypertensive retinopathy, bilateral: Secondary | ICD-10-CM | POA: Diagnosis not present

## 2023-08-07 DIAGNOSIS — H43813 Vitreous degeneration, bilateral: Secondary | ICD-10-CM

## 2023-08-07 DIAGNOSIS — D3132 Benign neoplasm of left choroid: Secondary | ICD-10-CM

## 2023-08-07 DIAGNOSIS — H348322 Tributary (branch) retinal vein occlusion, left eye, stable: Secondary | ICD-10-CM

## 2024-02-09 ENCOUNTER — Encounter (INDEPENDENT_AMBULATORY_CARE_PROVIDER_SITE_OTHER): Admitting: Ophthalmology
